# Patient Record
Sex: Male | Born: 1960 | Race: White | Hispanic: No | Marital: Single | Smoking: Former smoker
Health system: Southern US, Community
[De-identification: ages and names within clinical notes are randomized; demographics above are authoritative.]

## PROBLEM LIST (undated history)

## (undated) DIAGNOSIS — Z87442 Personal history of urinary calculi: Secondary | ICD-10-CM

## (undated) DIAGNOSIS — Z86010 Personal history of colonic polyps: Secondary | ICD-10-CM

## (undated) DIAGNOSIS — S93409A Sprain of unspecified ligament of unspecified ankle, initial encounter: Secondary | ICD-10-CM

## (undated) DIAGNOSIS — I10 Essential (primary) hypertension: Secondary | ICD-10-CM

## (undated) DIAGNOSIS — T7840XA Allergy, unspecified, initial encounter: Secondary | ICD-10-CM

## (undated) DIAGNOSIS — Z9889 Other specified postprocedural states: Secondary | ICD-10-CM

## (undated) DIAGNOSIS — K5792 Diverticulitis of intestine, part unspecified, without perforation or abscess without bleeding: Secondary | ICD-10-CM

## (undated) DIAGNOSIS — R112 Nausea with vomiting, unspecified: Secondary | ICD-10-CM

## (undated) HISTORY — DX: Diverticulitis of intestine, part unspecified, without perforation or abscess without bleeding: K57.92

## (undated) HISTORY — PX: COLONOSCOPY: SHX174

## (undated) HISTORY — DX: Essential (primary) hypertension: I10

## (undated) HISTORY — DX: Sprain of unspecified ligament of unspecified ankle, initial encounter: S93.409A

## (undated) HISTORY — DX: Personal history of urinary calculi: Z87.442

## (undated) HISTORY — DX: Allergy, unspecified, initial encounter: T78.40XA

## (undated) HISTORY — DX: Personal history of colonic polyps: Z86.010

---

## 2004-09-13 ENCOUNTER — Ambulatory Visit: Payer: Self-pay | Admitting: Internal Medicine

## 2005-11-11 ENCOUNTER — Ambulatory Visit: Payer: Self-pay | Admitting: Internal Medicine

## 2005-11-18 ENCOUNTER — Ambulatory Visit: Payer: Self-pay | Admitting: Internal Medicine

## 2006-02-11 ENCOUNTER — Ambulatory Visit: Payer: Self-pay | Admitting: Internal Medicine

## 2006-03-04 ENCOUNTER — Ambulatory Visit: Payer: Self-pay | Admitting: Internal Medicine

## 2007-01-05 DIAGNOSIS — S93409A Sprain of unspecified ligament of unspecified ankle, initial encounter: Secondary | ICD-10-CM | POA: Insufficient documentation

## 2007-01-05 HISTORY — DX: Sprain of unspecified ligament of unspecified ankle, initial encounter: S93.409A

## 2007-01-19 ENCOUNTER — Ambulatory Visit: Payer: Self-pay | Admitting: Internal Medicine

## 2007-03-09 DIAGNOSIS — Z8719 Personal history of other diseases of the digestive system: Secondary | ICD-10-CM

## 2007-03-09 DIAGNOSIS — Z87442 Personal history of urinary calculi: Secondary | ICD-10-CM | POA: Insufficient documentation

## 2007-03-09 HISTORY — DX: Personal history of urinary calculi: Z87.442

## 2007-11-10 ENCOUNTER — Ambulatory Visit: Payer: Self-pay | Admitting: Internal Medicine

## 2007-11-10 DIAGNOSIS — J019 Acute sinusitis, unspecified: Secondary | ICD-10-CM

## 2008-02-16 ENCOUNTER — Ambulatory Visit: Payer: Self-pay | Admitting: Internal Medicine

## 2008-06-07 ENCOUNTER — Ambulatory Visit: Payer: Self-pay | Admitting: Internal Medicine

## 2008-06-07 DIAGNOSIS — K5732 Diverticulitis of large intestine without perforation or abscess without bleeding: Secondary | ICD-10-CM

## 2008-06-07 DIAGNOSIS — R1032 Left lower quadrant pain: Secondary | ICD-10-CM | POA: Insufficient documentation

## 2008-06-07 LAB — CONVERTED CEMR LAB
Basophils Absolute: 0.1 10*3/uL (ref 0.0–0.1)
Bilirubin, Direct: 0.1 mg/dL (ref 0.0–0.3)
CO2: 26 meq/L (ref 19–32)
Calcium: 8.7 mg/dL (ref 8.4–10.5)
Creatinine, Ser: 0.8 mg/dL (ref 0.4–1.5)
Eosinophils Absolute: 0.1 10*3/uL (ref 0.0–0.7)
GFR calc Af Amer: 132 mL/min
GFR calc non Af Amer: 109 mL/min
Hemoglobin: 13.7 g/dL (ref 13.0–17.0)
Lymphocytes Relative: 12.9 % (ref 12.0–46.0)
MCHC: 34.7 g/dL (ref 30.0–36.0)
MCV: 93.8 fL (ref 78.0–100.0)
Monocytes Absolute: 1.3 10*3/uL — ABNORMAL HIGH (ref 0.1–1.0)
Neutro Abs: 9.5 10*3/uL — ABNORMAL HIGH (ref 1.4–7.7)
RDW: 10.9 % — ABNORMAL LOW (ref 11.5–14.6)
Sodium: 136 meq/L (ref 135–145)
Total Bilirubin: 1 mg/dL (ref 0.3–1.2)

## 2008-06-08 ENCOUNTER — Ambulatory Visit: Payer: Self-pay | Admitting: Cardiology

## 2008-06-08 ENCOUNTER — Inpatient Hospital Stay (HOSPITAL_COMMUNITY): Admission: EM | Admit: 2008-06-08 | Discharge: 2008-06-13 | Payer: Self-pay | Admitting: General Surgery

## 2008-08-02 ENCOUNTER — Encounter: Payer: Self-pay | Admitting: Internal Medicine

## 2008-08-03 ENCOUNTER — Ambulatory Visit: Payer: Self-pay | Admitting: Internal Medicine

## 2008-09-10 HISTORY — PX: COLECTOMY: SHX59

## 2008-09-11 ENCOUNTER — Encounter: Payer: Self-pay | Admitting: Internal Medicine

## 2008-09-14 ENCOUNTER — Inpatient Hospital Stay (HOSPITAL_COMMUNITY): Admission: RE | Admit: 2008-09-14 | Discharge: 2008-09-19 | Payer: Self-pay | Admitting: General Surgery

## 2008-09-14 ENCOUNTER — Encounter (INDEPENDENT_AMBULATORY_CARE_PROVIDER_SITE_OTHER): Payer: Self-pay | Admitting: General Surgery

## 2008-10-06 ENCOUNTER — Encounter: Payer: Self-pay | Admitting: Internal Medicine

## 2009-01-13 ENCOUNTER — Encounter: Payer: Self-pay | Admitting: Internal Medicine

## 2009-07-20 ENCOUNTER — Telehealth: Payer: Self-pay | Admitting: Internal Medicine

## 2009-08-31 ENCOUNTER — Ambulatory Visit: Payer: Self-pay | Admitting: Internal Medicine

## 2009-09-06 ENCOUNTER — Ambulatory Visit: Payer: Self-pay | Admitting: Internal Medicine

## 2009-10-24 ENCOUNTER — Encounter: Payer: Self-pay | Admitting: Internal Medicine

## 2010-06-13 NOTE — Assessment & Plan Note (Signed)
Summary: abd problems/bmw   Vital Signs:  Patient profile:   50 year old male Temp:     98.6 degrees F oral Pulse rate:   80 / minute Resp:     14 per minute BP sitting:   140 / 90  (left arm)  Vitals Entered By: Willy Eddy, LPN (September 06, 2009 9:41 AM) CC: c/o  possible hernia   Primary Care Provider:  Stacie Glaze MD  CC:  c/o  possible hernia.  History of Present Illness: then pt has a small umbilical herni that he would like reviewed he has a hx of prior labarscopic assisted colectomy for diverticulosis  Preventive Screening-Counseling & Management  Alcohol-Tobacco     Smoking Status: never  Problems Prior to Update: 1)  Abdominal Pain, Left Lower Quadrant  (ICD-789.04) 2)  Diverticulitis of Colon  (ICD-562.11) 3)  Acute Sinusitis, Unspecified  (ICD-461.9) 4)  Nephrolithiasis, Hx of  (ICD-V13.01) 5)  Diverticulitis, Hx of  (ICD-V12.79) 6)  Sprain/strain, Ankle Nos  (ICD-845.00)  Current Problems (verified): 1)  Abdominal Pain, Left Lower Quadrant  (ICD-789.04) 2)  Diverticulitis of Colon  (ICD-562.11) 3)  Acute Sinusitis, Unspecified  (ICD-461.9) 4)  Nephrolithiasis, Hx of  (ICD-V13.01) 5)  Diverticulitis, Hx of  (ICD-V12.79) 6)  Sprain/strain, Ankle Nos  (ICD-845.00)  Medications Prior to Update: 1)  Avelox 400 Mg Tabs (Moxifloxacin Hcl) .... One By Mouth Daily For 7 Days 2)  Brompheniramine-Pseudoeph 9-90 Mg Xr12h-Cap (Brompheniramine-Pseudoeph) .... One By Mouth Two Times A Day For 10 Days  Current Medications (verified): 1)  Avelox 400 Mg Tabs (Moxifloxacin Hcl) .... One By Mouth Daily For 7 Days 2)  Brompheniramine-Pseudoeph 9-90 Mg Xr12h-Cap (Brompheniramine-Pseudoeph) .... One By Mouth Two Times A Day For 10 Days  Allergies (verified): No Known Drug Allergies  Past History:  Past Medical History: Last updated: 03/09/2007 Diverticulitis, hx of Nephrolithiasis, hx of Anal Fistula  Past Surgical History: Last updated:  03/09/2007 Colonoscopy  Family History: Last updated: 03/09/2007 Family History of Cardiovascular disorder  Social History: Last updated: 03/09/2007 Occupation: Single Never Smoked Alcohol use-yes Drug use-no  Risk Factors: Smoking Status: never (09/06/2009)  Family History: Reviewed history from 03/09/2007 and no changes required. Family History of Cardiovascular disorder  Social History: Reviewed history from 03/09/2007 and no changes required. Occupation: Single Never Smoked Alcohol use-yes Drug use-no  Review of Systems  The patient denies anorexia, fever, weight loss, weight gain, vision loss, decreased hearing, hoarseness, chest pain, syncope, dyspnea on exertion, peripheral edema, prolonged cough, headaches, hemoptysis, abdominal pain, melena, hematochezia, severe indigestion/heartburn, hematuria, incontinence, genital sores, muscle weakness, suspicious skin lesions, transient blindness, difficulty walking, depression, unusual weight change, abnormal bleeding, enlarged lymph nodes, angioedema, and breast masses.    Physical Exam  General:  alert and overweight-appearing.   Head:  normocephalic and atraumatic.   Eyes:  pupils equal and pupils round.   Ears:  R ear normal and no external deformities.   Nose:  no external deformity and no nasal discharge.   Mouth:  posterior lymphoid hypertrophy and postnasal drip.   Neck:  No deformities, masses, or tenderness noted. Lungs:  normal respiratory effort and no wheezes.   Abdomen:  soft and non-tender.   Extremities:  No clubbing, cyanosis, edema, or deformity noted with normal full range of motion of all joints.   Neurologic:  No cranial nerve deficits noted. Station and gait are normal. Plantar reflexes are down-going bilaterally. DTRs are symmetrical throughout. Sensory, motor and coordinative functions appear intact. Skin:  Intact without suspicious lesions or rashes Psych:  Oriented X3 and memory intact for  recent and remote.     Impression & Recommendations:  Problem # 1:  HERNIA, UMBILICAL (ICD-553.1) 2cm periumbilical hernia observatons and guidence about any worisom progression  Complete Medication List: 1)  Avelox 400 Mg Tabs (Moxifloxacin hcl) .... One by mouth daily for 7 days 2)  Brompheniramine-pseudoeph 9-90 Mg Xr12h-cap (Brompheniramine-pseudoeph) .... One by mouth two times a day for 10 days  Patient Instructions: 1)  You need to lose weight. Consider a lower calorie diet and regular exercise.

## 2010-06-13 NOTE — Progress Notes (Signed)
  Phone Note Call from Patient   Summary of Call: pt called c/o sinusitis like signs with cough for 1 week-requesting medication Initial call taken by: Willy Eddy, LPN,  July 20, 2009 1:08 PM    New/Updated Medications: ATUSS DS 30-4-30 MG/5ML SUSP (PSEUDOEPHED HCL-CPM-DM HBR TAN) 2 tsp every 12 hours Prescriptions: ATUSS DS 30-4-30 MG/5ML SUSP (PSEUDOEPHED HCL-CPM-DM HBR TAN) 2 tsp every 12 hours  #8oz x 1   Entered by:   Willy Eddy, LPN   Authorized by:   Stacie Glaze MD   Signed by:   Willy Eddy, LPN on 16/02/9603   Method used:   Electronically to        [facility]. [phone]* (retail)       [address]       [place], [place]  [address]       Ph: [phone]       Fax: [phone]   RxID:   [id number] ZITHROMAX Z-PAK 250 MG TABS (AZITHROMYCIN) take as directed  #1 x 0   Entered by:   Willy Eddy, LPN   Authorized by:   Stacie Glaze MD   Signed by:   Willy Eddy, LPN on 53/66/4403   Method used:   Electronically to        [facility]. [phone]* (retail)       [address]       [place], [place]  [address]       Ph: [phone]       Fax: [phone]   RxID:   [id number]   Appended Document:  per dr Lovell Sheehan may have zpack and and atuss     8 oz 2 tspo every 12 hours

## 2010-06-13 NOTE — Assessment & Plan Note (Signed)
Summary: roa/bmw   Vital Signs:  Patient profile:   50 year old male Temp:     98.8 degrees F oral Pulse rate:   76 / minute Pulse rhythm:   regular Resp:     14 per minute BP sitting:   140 / 90  (left arm)  Vitals Entered By: Willy Eddy, LPN (August 31, 2009 12:11 PM) CC: c/o sinusitis like sx, URI symptoms   Primary Care Provider:  Stacie Glaze MD  CC:  c/o sinusitis like sx and URI symptoms.  History of Present Illness:  URI Symptoms      This is a 50 year old man who presents with URI symptoms.  The patient reports nasal congestion, purulent nasal discharge, productive cough, and earache, but denies clear nasal discharge, sore throat, dry cough, and sick contacts.  Associated symptoms include low-grade fever (<100.5 degrees).  The patient denies fever, fever of 100.5-103 degrees, fever of 103.1-104 degrees, fever to >104 degrees, stiff neck, dyspnea, wheezing, rash, vomiting, diarrhea, use of an antipyretic, and response to antipyretic.  The patient also reports headache and muscle aches.  Risk factors for Strep sinusitis include unilateral facial pain.    Preventive Screening-Counseling & Management  Alcohol-Tobacco     Smoking Status: never  Problems Prior to Update: 1)  Abdominal Pain, Left Lower Quadrant  (ICD-789.04) 2)  Diverticulitis of Colon  (ICD-562.11) 3)  Acute Sinusitis, Unspecified  (ICD-461.9) 4)  Nephrolithiasis, Hx of  (ICD-V13.01) 5)  Diverticulitis, Hx of  (ICD-V12.79) 6)  Sprain/strain, Ankle Nos  (ICD-845.00)  Current Problems (verified): 1)  Abdominal Pain, Left Lower Quadrant  (ICD-789.04) 2)  Diverticulitis of Colon  (ICD-562.11) 3)  Acute Sinusitis, Unspecified  (ICD-461.9) 4)  Nephrolithiasis, Hx of  (ICD-V13.01) 5)  Diverticulitis, Hx of  (ICD-V12.79) 6)  Sprain/strain, Ankle Nos  (ICD-845.00)  Medications Prior to Update: 1)  Metronidazole 250 Mg Tabs (Metronidazole) .Marland Kitchen.. 1 Four Times A Day 2)  Levaquin 500 Mg Tabs  (Levofloxacin) .... One By Mouth Daily 3)  Zithromax Z-Pak 250 Mg Tabs (Azithromycin) .... Take As Directed 4)  Atuss Ds 30-4-30 Mg/40ml Susp (Pseudoephed Hcl-Cpm-Dm Hbr Tan) .... 2 Tsp Every 12 Hours  Current Medications (verified): 1)  Avelox 400 Mg Tabs (Moxifloxacin Hcl) .... One By Mouth Daily For 7 Days 2)  Brompheniramine-Pseudoeph 9-90 Mg Xr12h-Cap (Brompheniramine-Pseudoeph) .... One By Mouth Two Times A Day For 10 Days  Allergies (verified): No Known Drug Allergies  Past History:  Family History: Last updated: 03/09/2007 Family History of Cardiovascular disorder  Social History: Last updated: 03/09/2007 Occupation: Single Never Smoked Alcohol use-yes Drug use-no  Risk Factors: Smoking Status: never (08/31/2009)  Past medical, surgical, family and social histories (including risk factors) reviewed, and no changes noted (except as noted below).  Past Medical History: Reviewed history from 03/09/2007 and no changes required. Diverticulitis, hx of Nephrolithiasis, hx of Anal Fistula  Past Surgical History: Reviewed history from 03/09/2007 and no changes required. Colonoscopy  Family History: Reviewed history from 03/09/2007 and no changes required. Family History of Cardiovascular disorder  Social History: Reviewed history from 03/09/2007 and no changes required. Occupation: Single Never Smoked Alcohol use-yes Drug use-no  Review of Systems  The patient denies anorexia, fever, weight loss, weight gain, vision loss, decreased hearing, hoarseness, chest pain, syncope, dyspnea on exertion, peripheral edema, prolonged cough, headaches, hemoptysis, abdominal pain, melena, hematochezia, severe indigestion/heartburn, hematuria, incontinence, genital sores, muscle weakness, suspicious skin lesions, transient blindness, difficulty walking, depression, unusual weight change, abnormal bleeding,  enlarged lymph nodes, angioedema, breast masses, and testicular masses.     Physical Exam  General:  alert and overweight-appearing.   Head:  normocephalic and atraumatic.   Eyes:  pupils equal and pupils round.   Ears:  R ear normal and no external deformities.   Nose:  no external deformity and no nasal discharge.   Mouth:  posterior lymphoid hypertrophy and postnasal drip.   Neck:  No deformities, masses, or tenderness noted. Lungs:  normal respiratory effort and no wheezes.   Heart:  normal rate and regular rhythm.   Abdomen:  soft, non-tender, and normal bowel sounds.     Impression & Recommendations:  Problem # 1:  ACUTE SINUSITIS, UNSPECIFIED (ICD-461.9) Assessment Deteriorated acute muscopurulent sinusitis The following medications were removed from the medication list:    Metronidazole 250 Mg Tabs (Metronidazole) .Marland Kitchen... 1 four times a day    Levaquin 500 Mg Tabs (Levofloxacin) ..... One by mouth daily    Zithromax Z-pak 250 Mg Tabs (Azithromycin) .Marland Kitchen... Take as directed    Atuss Ds 30-4-30 Mg/54ml Susp (Pseudoephed hcl-cpm-dm hbr tan) .Marland Kitchen... 2 tsp every 12 hours His updated medication list for this problem includes:    Avelox 400 Mg Tabs (Moxifloxacin hcl) ..... One by mouth daily for 7 days    Brompheniramine-pseudoeph 9-90 Mg Xr12h-cap (Brompheniramine-pseudoeph) ..... One by mouth two times a day for 10 days  Instructed on treatment. Call if symptoms persist or worsen.   Complete Medication List: 1)  Avelox 400 Mg Tabs (Moxifloxacin hcl) .... One by mouth daily for 7 days 2)  Brompheniramine-pseudoeph 9-90 Mg Xr12h-cap (Brompheniramine-pseudoeph) .... One by mouth two times a day for 10 days  Patient Instructions: 1)  Take your antibiotic as prescribed until ALL of it is gone, but stop if you develop a rash or swelling and contact our office as soon as possible. Prescriptions: BROMPHENIRAMINE-PSEUDOEPH 9-90 MG XR12H-CAP (BROMPHENIRAMINE-PSEUDOEPH) one by mouth two times a day for 10 days  #20 x 1   Entered and Authorized by:   Stacie Glaze  MD   Signed by:   Stacie Glaze MD on 08/31/2009   Method used:   Electronically to        [facility]. [phone]* (retail)       [address]       [place], [place]  [address]       Ph: [phone]       Fax: [phone]   RxID:   [id number]

## 2010-06-13 NOTE — Medication Information (Signed)
Summary: Order for Medical Compression Garments  Order for Medical Compression Garments   Imported By: Maryln Gottron 10/26/2009 11:31:34  _____________________________________________________________________  External Attachment:    Type:   Image     Comment:   External Document

## 2010-08-21 LAB — CBC
HCT: 38.5 % — ABNORMAL LOW (ref 39.0–52.0)
HCT: 38.6 % — ABNORMAL LOW (ref 39.0–52.0)
Hemoglobin: 13.7 g/dL (ref 13.0–17.0)
Hemoglobin: 13.8 g/dL (ref 13.0–17.0)
MCHC: 35.5 g/dL (ref 30.0–36.0)
MCHC: 35.8 g/dL (ref 30.0–36.0)
Platelets: 329 10*3/uL (ref 150–400)
RBC: 4.1 MIL/uL — ABNORMAL LOW (ref 4.22–5.81)
RDW: 13.1 % (ref 11.5–15.5)
RDW: 13.4 % (ref 11.5–15.5)

## 2010-08-21 LAB — BASIC METABOLIC PANEL
BUN: 6 mg/dL (ref 6–23)
CO2: 26 mEq/L (ref 19–32)
CO2: 29 mEq/L (ref 19–32)
Calcium: 8.4 mg/dL (ref 8.4–10.5)
GFR calc Af Amer: >60 mL/min (ref >60)
GFR calc non Af Amer: >60 mL/min (ref >60)
Glucose, Bld: 107 mg/dL — ABNORMAL HIGH (ref 70–99)
Glucose, Bld: 128 mg/dL — ABNORMAL HIGH (ref 70–99)
Potassium: 3.7 mEq/L (ref 3.5–5.1)
Potassium: 4.7 mEq/L (ref 3.5–5.1)
Sodium: 134 mEq/L — ABNORMAL LOW (ref 135–145)
Sodium: 136 mEq/L (ref 135–145)

## 2010-08-22 LAB — COMPREHENSIVE METABOLIC PANEL
ALT: 29 U/L (ref 0–53)
AST: 25 U/L (ref 0–37)
CO2: 26 mEq/L (ref 19–32)
Chloride: 107 mEq/L (ref 96–112)
GFR calc Af Amer: >60 mL/min (ref >60)
GFR calc non Af Amer: >60 mL/min (ref >60)
Sodium: 139 mEq/L (ref 135–145)
Total Bilirubin: 0.6 mg/dL (ref 0.3–1.2)

## 2010-08-22 LAB — URINALYSIS, ROUTINE W REFLEX MICROSCOPIC
Bilirubin Urine: NEGATIVE
Glucose, UA: NEGATIVE mg/dL
Hgb urine dipstick: NEGATIVE
Ketones, ur: NEGATIVE mg/dL
Nitrite: NEGATIVE
Protein, ur: NEGATIVE mg/dL
Specific Gravity, Urine: 1.029 (ref 1.005–1.030)
Urobilinogen, UA: 0.2 mg/dL (ref 0.0–1.0)
pH: 6 (ref 5.0–8.0)

## 2010-08-22 LAB — CBC
HCT: 43.6 % (ref 39.0–52.0)
Hemoglobin: 15.2 g/dL (ref 13.0–17.0)
MCHC: 34.9 g/dL (ref 30.0–36.0)
MCV: 93.6 fL (ref 78.0–100.0)
Platelets: 224 10*3/uL (ref 150–400)
RBC: 4.66 MIL/uL (ref 4.22–5.81)
RDW: 14.1 % (ref 11.5–15.5)
WBC: 8 10*3/uL (ref 4.0–10.5)

## 2010-08-22 LAB — DIFFERENTIAL
Basophils Absolute: 0 10*3/uL (ref 0.0–0.1)
Basophils Relative: 0 % (ref 0–1)
Eosinophils Absolute: 0.2 10*3/uL (ref 0.0–0.7)
Eosinophils Relative: 3 % (ref 0–5)

## 2010-08-27 LAB — CBC
HCT: 36 % — ABNORMAL LOW (ref 39.0–52.0)
HCT: 38.3 % — ABNORMAL LOW (ref 39.0–52.0)
Hemoglobin: 12.3 g/dL — ABNORMAL LOW (ref 13.0–17.0)
Hemoglobin: 13.2 g/dL (ref 13.0–17.0)
MCHC: 34.1 g/dL (ref 30.0–36.0)
MCHC: 34.3 g/dL (ref 30.0–36.0)
MCV: 93.5 fL (ref 78.0–100.0)
MCV: 94.1 fL (ref 78.0–100.0)
Platelets: 427 10*3/uL — ABNORMAL HIGH (ref 150–400)
Platelets: 431 10*3/uL — ABNORMAL HIGH (ref 150–400)
RBC: 3.85 MIL/uL — ABNORMAL LOW (ref 4.22–5.81)
RBC: 4.07 MIL/uL — ABNORMAL LOW (ref 4.22–5.81)
RDW: 11.7 % (ref 11.5–15.5)
RDW: 12 % (ref 11.5–15.5)
WBC: 6.5 10*3/uL (ref 4.0–10.5)
WBC: 9.4 10*3/uL (ref 4.0–10.5)

## 2010-08-27 LAB — URINALYSIS, ROUTINE W REFLEX MICROSCOPIC
Bilirubin Urine: NEGATIVE
Glucose, UA: NEGATIVE mg/dL
Hgb urine dipstick: NEGATIVE
Ketones, ur: 40 mg/dL — AB
Nitrite: NEGATIVE
Protein, ur: NEGATIVE mg/dL
Specific Gravity, Urine: 1.046 — ABNORMAL HIGH (ref 1.005–1.030)
Urobilinogen, UA: 1 mg/dL (ref 0.0–1.0)
pH: 6 (ref 5.0–8.0)

## 2010-08-27 LAB — COMPREHENSIVE METABOLIC PANEL
ALT: 21 U/L (ref 0–53)
AST: 18 U/L (ref 0–37)
Albumin: 3.1 g/dL — ABNORMAL LOW (ref 3.5–5.2)
Alkaline Phosphatase: 64 U/L (ref 39–117)
BUN: 11 mg/dL (ref 6–23)
CO2: 24 mEq/L (ref 19–32)
Calcium: 8.6 mg/dL (ref 8.4–10.5)
Chloride: 99 mEq/L (ref 96–112)
Creatinine, Ser: 0.89 mg/dL (ref 0.4–1.5)
GFR calc Af Amer: >60 mL/min (ref >60)
GFR calc non Af Amer: >60 mL/min (ref >60)
Glucose, Bld: 105 mg/dL — ABNORMAL HIGH (ref 70–99)
Potassium: 3.7 mEq/L (ref 3.5–5.1)
Sodium: 132 mEq/L — ABNORMAL LOW (ref 135–145)
Total Bilirubin: 0.8 mg/dL (ref 0.3–1.2)
Total Protein: 6.9 g/dL (ref 6.0–8.3)

## 2010-08-27 LAB — APTT: aPTT: 41 seconds — ABNORMAL HIGH (ref 24–37)

## 2010-08-27 LAB — PROTIME-INR
INR: 1.2 (ref 0.00–1.49)
Prothrombin Time: 15.6 seconds — ABNORMAL HIGH (ref 11.6–15.2)

## 2010-09-25 NOTE — Op Note (Signed)
[name], [name]                ACCOUNT NO.:  [id number]   MEDICAL RECORD NO.:  [id number]          PATIENT TYPE:  INP   LOCATION:  1532                         FACILITY:  [facility]   PHYSICIAN:  Sharlet Salina T. Hoxworth, M.D.DATE OF BIRTH:  March 13, 1961   DATE OF PROCEDURE:  09/14/2008  DATE OF DISCHARGE:                               OPERATIVE REPORT   PREOPERATIVE DIAGNOSIS:  Diverticulitis, sigmoid colon.   POSTOPERATIVE DIAGNOSIS:  Diverticulitis, sigmoid colon.   SURGICAL PROCEDURE:  Laparoscopic-assisted sigmoid colectomy with  takedown of splenic flexure.   SURGEON:  Lorne Skeens. Hoxworth, M.D.   ASSISTANT:  Anselm Pancoast. Zachery Dakins, M.D.   ANESTHESIA:  General.   BRIEF HISTORY:  [name] [name] is a 50 year old male who was hospitalized in  late January and early February for acute diverticulitis of the sigmoid  colon with a pericolonic abscess.  The abscess resolved with  antibiotics, and he improved and was discharged home but has had  continued pain and on follow-up in the office has developed a recurrent  palpable mass in the left lower quadrant.  He has required ongoing  treatment with antibiotics, and we have elected to proceed with sigmoid  colectomy.  We plan to do this laparoscopic assisted, if possible.  The  nature procedure, its indications, risks of anesthesia complications,  bleeding, infection, visceral injury, temporary colostomy were discussed  and understood.  Following mechanical antibiotic bowel prep at home, he  is now admitted for this procedure.   DESCRIPTION OF OPERATION:  The patient was brought to the operating  room, placed in supine position on the operating table, and general  orotracheal anesthesia was induced.  He was then carefully secured to  the bed and positioned in semilithotomy position with arms tucked and  carefully padded.  He received preoperative broad-spectrum IV  antibiotics.  PAS were in place.  Correct patient and procedure were  verified, after abdomen and perineum widely sterilely prepped and  draped.  Access was obtained with an open Hassan technique with a 1.5-cm  incision above the umbilicus and pneumoperitoneum established.  There  was a large inflammatory mass involving the sigmoid colon obviously  apparent.  Under direct vision, two 5-mm trocars were placed in the  right and left lower quadrant.  I then through a 7-cm low midline  incision placed a wound protector and GelPort for hand assist.  The mass  in the sigmoid which appeared to be inflammatory was quite large.  There  was some fixation and induration.  I did mobilize this somewhat off the  lateral peritoneal wall with careful blunt dissection through some  chronic dense inflammatory adhesions.  There was a good amount of normal  soft distal rectosigmoid to well up above the sacral promontory.  The  colon proximal to the sigmoid in the distal left colon was normal  without diverticular thickening or muscle hypertrophy.  Unfortunately,  the mass was so large and was so fixed I really could not elevate this  to approach the mesentery medially as there was just no room to elevate  the colon and expose the mesentery,  and laterally, the adhesions were so  dense inflammatory that I did not feel I could safely do this  laparoscopically.  I therefore decided to proceed with mobilization of  the left colon and splenic flexure laparoscopically to avoid the  incision above the umbilicus.  The peritoneal reflection along the  distal left colon and then working up toward the sigmoid was divided  with the cautery and the left colon carefully bluntly mobilized out of  the retroperitoneum.  The omentum was then elevated and was carefully  dissected up off of the distal transverse colon with the LigaSure  carefully protecting the colon.  As this dissection proceeded more  proximally, the lesser sac was entered, and then the lesser sac was  widely opened from the mid  transverse colon distally toward the splenic  flexure.  Final attachments of the omentum off the splenic flexure were  mobilized, and then the splenocolic ligament was exposed and  sequentially divided with the LigaSure device, and then the splenic  flexure further bluntly mobilized medially and inferiorly until the  transverse colon, splenic flexure and left colon were all completely and  well mobilized.  Following this, CO2 evacuated, and I removed the  GelPort.  Initially, the sigmoid colon was carefully mobilized laterally  under direct vision through the 7-cm incision; however, to provide  better exposure to the bulkiness of the inflammatory mass with the wound  protector remaining in place, I extended the skin and fascial incision  up another several centimeters well below the umbilicus.  This allowed  better mobilization and visualization, and using careful dissection and  staying well up toward the bowel, the mesentery was mobilized lateral to  medial.  There was so much inflammatory change down into the mesentery  and the retroperitoneum that I did not feel I was safely able to  directly visualize the ureter.  Instead, we kept the dissection well up  on the mesentery toward the bowel.  Eventually, we were able to get the  mesentery completely mobilized and bring up the distal normal bowel into  the incision.  A place for division was chosen, and it was cleaned of  mesenteric and pericolic fat distally and divided with GIA stapler.  This allowed elevation of the distal specimen, and then we were able to  come down along the mesentery with the LigaSure, working distal to  proximal, again staying close to the colon.  Continued proximally up to  normal mesentery and distal left colon, and there was plenty of mobility  to bring this down for an end-to-end anastomosis.  A point of proximal  division was chosen of normal bowel, and this was divided between [employer] clamps, and  the final bit of mesentery was divided with the  LigaSure and the specimen removed.  Following this, the pelvis was  thoroughly irrigated and hemostasis obtained.  An end-to-end anastomosis  was then created.  The staple line was excised from the distal bowel,  and then the anastomosis was created with full-thickness inverting  interrupted 2-0 silk sutures circumferentially.  There was no tension,  good blood supply, and the anastomosis was widely patent.  The pelvis  was then again irrigated.  Hemostasis assured.  Following this, I put  the GelPort back on, and the patient was reinsufflated, and under direct  laparoscopic vision, the upper abdomen and left upper quadrant  retroperitoneum were inspected for hemostasis which appeared complete.  Following this, trocars were removed, CO2 evacuated and  the GelPort and  wound protector removed.  Gloves and instruments were changed.  The  midline wound was then closed with running #1 PDS begun at either end  and tied centrally.  The subcutaneous tissue was irrigated, and skin  closed with staples.  Sponge, needle and instrument counts correct.  The  patient taken to recovery in good condition.      Lorne Skeens. Hoxworth, M.D.  Electronically Signed     BTH/MEDQ  D:  09/14/2008  T:  09/14/2008  Job:  [id number]

## 2010-09-25 NOTE — H&P (Signed)
NAMELADARRION, [name]                ACCOUNT NO.:  [id number]   MEDICAL RECORD NO.:  [id number]          PATIENT TYPE:  INP   LOCATION:  1529                         FACILITY:  [facility]   PHYSICIAN:  Sharlet Salina T. Hoxworth, M.D.DATE OF BIRTH:  [birth year]   DATE OF ADMISSION:  06/08/2008  DATE OF DISCHARGE:                              HISTORY & PHYSICAL   CHIEF COMPLAINT:  Left lower quadrant abdominal pain.   HISTORY OF PRESENT ILLNESS:  [name] [name] is a 50 year old male with a  previous significant history of an episode of acute diverticulitis 2  years ago which was diagnosed by CT scan and treated with outpatient  antibiotics.  He states he is not really had any ongoing problems since  that time.  He now, however, presents with about 2 weeks of increasing  left lower quadrant abdominal pain.  This was gradual in onset.  He has  had some low-grade fever.  He feels some constipation and generalized  abdominal pressure which is relieved somewhat with a bowel movement.  He  called Dr. Lovell Sheehan 3 days ago and was started empirically on Cipro and  Flagyl, and then a CT scan was ordered after the patient was seen in  office.  This has returned showing significant diverticulitis with  abscess as described below.  His pain is not severe but is ongoing and  significant.  He has had some low-grade fever of about 100-100.5.  No  nausea or vomiting.  No urinary symptoms.   PAST MEDICAL HISTORY:  1. He has had an anal fistulotomy a number of years ago.  2. He had a colonoscopy 2 years ago.  He is not followed for any      significant medical conditions.   MEDICATIONS:  None.   ALLERGIES:  NONE.   SOCIAL HISTORY:  Single, works as an Pensions consultant.  Does not smoke  cigarettes, drinks occasional alcohol.   FAMILY HISTORY:  Noncontributory.   REVIEW OF SYSTEMS:  GENERAL:  Positive for fever.  No malaise or  weakness.  HEENT:  No vision, hearing or swallowing problems.  RESPIRATORY:  No  shortness of breath, cough or wheezing.  CARDIAC:  No chest pain, palpitation, swelling or history of heart  disease.  ABDOMEN/GI:  As above.  GU:  No urinary burning, frequency or pneumaturia.  MUSCULOSKELETAL:  No joint pain, swelling.  NEUROLOGIC:  No numbness, weakness or syncope.   PHYSICAL EXAMINATION:  VITAL SIGNS:  Temperature is 98.6, pulse 97,  respirations 18, blood pressure 154/99.  GENERAL:  He is a mildly overweight white male who does not appear  acutely ill.  SKIN:  Warm and dry.  No rash or infection.  HEENT:  No palpable mass or thyromegaly.  Sclerae are nonicteric.  Oropharynx clear.  Pupils equal and reactive to light.  EOMs intact.  LUNGS:  Clear without wheezing or increased work of breathing.  CARDIAC:  Regular rate and rhythm.  No murmurs.  No edema.  No JVD.  LYMPH NODES:  No cervical, subclavicular, axillary or inguinal nodes  palpable.  ABDOMEN:  Mild to moderate obesity.  Positive bowel sounds.  There is  tenderness in the left lower quadrant and a definite palpable mass.  No  peritoneal signs and minimal guarding.  Remainder of abdomen soft and  nontender.  No organomegaly.  No hernias.  GU:  Normal male.  EXTREMITIES:  No joint swelling or deformity.  NEUROLOGIC:  Alert and oriented.  Motor and sensory exam is grossly  normal.   LABORATORY:  White count is 9.4, hemoglobin 13.2.  Remainder of lab  pending.  CT of the abdomen and pelvis obtained today shows minimal  atelectasis.  No acute abnormality of the abdomen.  Pelvis shows  extensive inflammatory change at the junction of the descending and  sigmoid colon with wall thickening and an extracolonic fluid collection  measuring 4.2 cm in maximum diameter, as well as an immediately adjacent  second collection measuring 1.9 cm.  Appendix is normal.   ASSESSMENT/PLAN:  Acute sigmoid diverticulitis with pericolonic abscess.  The patient does not appear severely ill.  He is to be admitted and  placed on  IV antibiotics.  Will ask interventional radiology to see in  regards to possible percutaneous drainage of his colonic abscess.  Ideally, this episode could be managed nonoperatively and then will need  to consider elective resection due to recurrence with complication of  pericolonic abscess.      Lorne Skeens. Hoxworth, M.D.  Electronically Signed     BTH/MEDQ  D:  06/08/2008  T:  06/08/2008  Job:  [id number]   cc:   Stacie Glaze, MD  [address] [place]  [place] [address]

## 2010-09-25 NOTE — Discharge Summary (Signed)
[name] [name], [name] [name]                ACCOUNT NO.:  [id number]   MEDICAL RECORD NO.:  [id number]          PATIENT TYPE:  INP   LOCATION:  1529                         FACILITY:  [facility]   PHYSICIAN:  Sharlet Salina T. Hoxworth, M.D.DATE OF BIRTH:  [birth year]   DATE OF ADMISSION:  06/08/2008  DATE OF DISCHARGE:  06/13/2008                               DISCHARGE SUMMARY   DISCHARGE DIAGNOSIS:  Diverticulitis sigmoid colon with pericolonic  abscess.   OPERATIONS AND PROCEDURES:  None.   HISTORY OF PRESENT ILLNESS:  [name] [name] is a 50 year old male with a  previous history of acute diverticulitis about 2 years ago diagnosed  with CT scan and treated with outpatient antibiotics.  He has not had  any ongoing symptoms but now presents to his primary physician with 2  weeks of increasing left lower quadrant abdominal pain, gradual in  onset.  This was associated with low-grade fever, constipation and  generalized abdominal pressure.  He was started on Cipro and Flagyl  empirically 3 days prior to admission, and CT scan was obtained on the  day of admission.  This shows significant sigmoid colon diverticulitis  with a pericolonic abscess about 4.2 cm.  The patient was referred for  further evaluation treatment and is admitted.   PAST MEDICAL HISTORY:  History of anal fistulotomy a number of years ago  and underwent colonoscopy 2 years ago.  No medical conditions.   MEDICATIONS:  None.   ALLERGIES:  None.   SOCIAL HISTORY, FAMILY HISTORY, REVIEW OF SYSTEMS:  See detailed H and  P.   PERTINENT PHYSICAL EXAMINATION:  He was afebrile.  Vital signs within  normal limits.  GENERAL:  Mildly overweight white male who does not appear acutely ill.  Pertinent findings limited to the abdomen which revealed moderate  obesity, tenderness in the left lower quadrant and a definite palpable  mass.  No peritoneal signs, minimal guarding.   LABORATORY:  White count was 9.4, hemoglobin 13.  CT the abdomen  and  pelvis as above.   HOSPITAL COURSE:  The patient was then admitted, placed on IV Unasyn.  Interventional radiology was consulted for percutaneous drainage.  However, at the time he was taken down to CT on January 29, there was  significantly decreased pericolonic fluid not felt to be significant at  this point and decreased inflammation.  Therefore, he did not undergo  any percutaneous drainage but continued on IV antibiotics.  He improved  steadily over the next 3 days, and by February 1, he was pain free.  He  no longer had a palpable mass but some mild fullness  in the left lower quadrant and minimal tenderness.  He is discharged  home on Cipro and Flagyl which he will continue for another week.  He is  to call for any worsening symptoms.  Otherwise return to the office for  follow-up in 2-3 weeks.      Lorne Skeens. Hoxworth, M.D.  Electronically Signed     BTH/MEDQ  D:  07/25/2008  T:  07/25/2008  Job:  [id number]   cc:   Balinda Quails.  Lovell Sheehan, MD  [address] [place]  [place] [address]

## 2010-09-28 NOTE — Assessment & Plan Note (Signed)
[facility]                           GASTROENTEROLOGY OFFICE NOTE   NAME:[name] [name], [name] [name]                       MRN:          [id number]  DATE:02/11/2006                            DOB:          12/27/1960    REASON FOR CONSULTATION:  Change in bowel habits, recurrent diverticulitis.   ASSESSMENT:  This 50 year old white man who has had problems for at least  six to nine months, with changes in bowel habits and crampy left lower  quadrant pain.  He had confirmation of diverticulitis on July 9,2007, CT.  Wall thickening and pericolonic stranding at the junction of the sigmoid and  descending colon.  He feels better but still has some problems.  He has had  one course of Cipro and Flagyl at the end of June or around July with  relief.   PLAN:  1. Colonoscopy, to investigate for persistent damage, a tumor as a trigger      of this.  2. Prior to that he will take Cipro 500 mg b.i.d. and Flagyl 500 mg b.i.d.      for 10 days.  3. He only has mild left lower quadrant tenderness on exam today.  If he      has persistent problems or worsening problems, he is to contact me      before the colonoscopy, and understands that.  4. Subsequent to that, it may be appropriate to see a surgeon about long-      term management of his diverticulitis with potential resection of the      diseased area.  It is a little bit of a difficult call since we only      have one confirmed episode on CT scanning, but at his age it is clearly      something to think about.  This was all explained to the patient,      including the risks, benefits and indications of a colonoscopy.  He      understands and agrees to proceed.   HISTORY:  This 50 year old white man with problems as described above.  Please see the medical history and physical form for full details.   REVIEW OF SYSTEMS:  His GI review of systems is otherwise negative.  There  is no bleeding.  He has had some  constipation difficulty and intermittent  left lower quadrant pain.  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  He denies fever, and his comprehensive review  of systems is otherwise negative except for some occasional mild dysuria  associated with this.   PAST MEDICAL HISTORY:  1. Nephrolithiasis.  2. Anal fistula.  He had a sigmoidoscopy exam by Dr. Stacie Glaze in      February 1994.   MEDICATIONS:  None.   FAMILY HISTORY:  No colon cancer.   SOCIAL HISTORY:  He is single.  Some alcohol.  No tobacco or drugs.  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   FAMILY HISTORY:  Pertinent for heart disease.   PHYSICAL EXAMINATION:  GENERAL:  A pleasant, overweight to obese white male.  Height 5 feet 10 inches, weight 239 pounds, blood pressure 132/90, pulse 72.  HEENT:  Eyes anicteric.  Mouth:  Posterior pharynx free of lesions.  NECK:  Supple without mass or thyromegaly.  CHEST:  Clear, resonant.  HEART:  S1, S2, no murmurs or gallops.  ABDOMEN:  Soft, obese.  Bowel sounds present.  He is tender in the left  lower quadrant to deep palpation.  There is a slight fullness there.  There  is no organomegaly or mass otherwise.  NODES:  No neck or supraclavicular nodes.  EXTREMITIES:  Trace peripheral edema bilaterally. There is a tattoo on the  right ankle.  SKIN:  Lower extremities and trunk free of rash, warm and dry.  NEUROLOGIC/PSYCH:  He is alert and oriented x3.   I appreciate the opportunity to care for this patient.       Iva Boop, MD,FACG      CEG/MedQ  DD:  02/11/2006  DT:  02/12/2006  Job #:  [id number]   cc:   Stacie Glaze, MD

## 2010-09-28 NOTE — Discharge Summary (Signed)
[name] [name], [name] [name]                ACCOUNT NO.:  [id number]   MEDICAL RECORD NO.:  [id number]          PATIENT TYPE:  INP   LOCATION:  1532                         [facility]ACILITY:  [facility]   PHYSICIAN:  Sharlet Salina T. Hoxworth, M.D.DATE OF BIRTH:  05-28-1960   DATE OF ADMISSION:  09/14/2008  DATE OF DISCHARGE:  09/19/2008                               DISCHARGE SUMMARY   DISCHARGE DIAGNOSIS:  Diverticulitis of the sigmoid colon.   SURGICAL PROCEDURE:  Laparoscopic-assisted sigmoid colectomy Sep 14, 2008.   HISTORY OF PRESENT ILLNESS:  [name] is a 50 year old male who was  hospitalized from June 08, 2008, to June 13, 2008, for  diverticulitis of the sigmoid colon with a pericolonic abscess, resolved  by CT scan on IV antibiotics.  He improved and was discharged home.  He,  however, returned in March 2010 with continued discomfort in the left  lower quadrant and a recurrent palpable mass associated with the sigmoid  colon.  Due to persistence of significant inflammatory change despite  long-term IV and p.o. antibiotics and continued symptoms, we have  recommended proceeding with sigmoid colectomy.  He is now admitted for  this procedure.   PAST MEDICAL/SURGICAL HISTORY:  Generally unremarkable without serious  illness.  He had a colonoscopy 2 years ago.  He has had an anal  fistulotomy.  Medical:  No illnesses or hospitalizations.   MEDICATIONS:  None.   ALLERGIES:  None.   SOCIAL HISTORY:  Works as a Financial controller.  No cigarettes or alcohol.   Physical exam was pertinent for a tender palpable mass in the left lower  quadrant.   HOSPITAL COURSE:  Following a mechanical antibiotic bowel prep at home,  the patient was admitted on the morning of his procedure and underwent  an uneventful laparoscopic-assisted sigmoid colectomy with finding of a  large inflammatory mass within the sigmoid colon.  Postoperatively, he  initially did very well feeling well on the first postoperative  day, and  his Foley was removed.  He was started on a clear liquid diet.  On the  second postoperative day, he was noted to be moderately distended  consistent with ileus, and he was left on clear liquids.  He did have  some vomiting on the third postoperative day and remained on sips of  clear liquids.  By the fourth postoperative day, he was feeling better,  had passed lots of flatus and stool, and his diet was gradually  advanced.  By Sep 19, 2008, he was tolerating full liquid diet without  difficulty and had had several bowel movements.  Abdomen was soft.  Wounds healing well.  White count was 7000.  Final pathology revealed  acute diverticulitis with localized perforation and mesenteric abscess.   DISCHARGE MEDICATIONS:  Tylox for pain.   FOLLOWUP:  To be in my office in 1 week for staple removal.      Sharlet Salina T. Hoxworth, M.D.  Electronically Signed     BTH/MEDQ  D:  10/17/2008  T:  10/17/2008  Job:  [id number]   cc:   Stacie Glaze, MD  [phone]  [address]  [place]  [place] [address]

## 2011-01-24 ENCOUNTER — Other Ambulatory Visit: Payer: Self-pay | Admitting: *Deleted

## 2011-01-24 MED ORDER — LEVOFLOXACIN 500 MG PO TABS: 500.0000 mg | ORAL_TABLET | Freq: Every day | ORAL | Status: AC

## 2011-04-03 ENCOUNTER — Encounter: Payer: Self-pay | Admitting: Internal Medicine

## 2011-10-10 ENCOUNTER — Other Ambulatory Visit: Payer: Self-pay | Admitting: *Deleted

## 2011-10-10 MED ORDER — LEVOFLOXACIN 500 MG PO TABS: 500.0000 mg | ORAL_TABLET | Freq: Every day | ORAL | Status: AC

## 2012-02-05 ENCOUNTER — Encounter: Payer: Self-pay | Admitting: Internal Medicine

## 2012-05-09 ENCOUNTER — Encounter: Payer: Self-pay | Admitting: Internal Medicine

## 2012-05-09 ENCOUNTER — Ambulatory Visit (INDEPENDENT_AMBULATORY_CARE_PROVIDER_SITE_OTHER): Payer: BC Managed Care – PPO | Admitting: Internal Medicine

## 2012-05-09 VITALS — BP 150/106 | HR 96 | Temp 98.0°F | Ht 69.75 in | Wt 261.0 lb

## 2012-05-09 DIAGNOSIS — J329 Chronic sinusitis, unspecified: Secondary | ICD-10-CM

## 2012-05-09 DIAGNOSIS — R030 Elevated blood-pressure reading, without diagnosis of hypertension: Secondary | ICD-10-CM

## 2012-05-09 DIAGNOSIS — H9209 Otalgia, unspecified ear: Secondary | ICD-10-CM

## 2012-05-09 DIAGNOSIS — H9202 Otalgia, left ear: Secondary | ICD-10-CM

## 2012-05-09 DIAGNOSIS — I10 Essential (primary) hypertension: Secondary | ICD-10-CM | POA: Insufficient documentation

## 2012-05-09 MED ORDER — AMOXICILLIN 500 MG PO CAPS: 500.0000 mg | ORAL_CAPSULE | Freq: Three times a day (TID) | ORAL | Status: DC

## 2012-05-09 NOTE — Progress Notes (Signed)
Chief Complaint  Patient presents with  . Sinusitis  . Otalgia    left    HPI: Patient comes in today for SDA  Sat clinic for  new problem evaluation. Onset  3 days ago   And had low grade temp 101 at onset and then got  Ear pain and neck left  .  Taking  Advil.   Left ear pain tight into neck and gurgling when try to equalize .  Sinus infection.  Uses  netti pot     Exposed to cats   Some allergy   Had flu shot this  Year.   ROS: See pertinent positives and negatives per HPI.  Past Medical History  Diagnosis Date  . Diverticulitis   . Nephrolithiasis   . Anal fistula     Family History  Problem Relation Age of Onset  . Heart disease Other     History   Social History  . Marital Status: Single    Spouse Name: N/A    Number of Children: N/A  . Years of Education: N/A   Social History Main Topics  . Smoking status: Current Some Day Smoker    Types: Cigarettes  . Smokeless tobacco: None  . Alcohol Use: Yes  . Drug Use: No  . Sexually Active: None   Other Topics Concern  . None   Social History Narrative  . None    Outpatient Encounter Prescriptions as of 05/09/2012  Medication Sig Dispense Refill  . amoxicillin (AMOXIL) 500 MG capsule Take 1 capsule (500 mg total) by mouth 3 (three) times daily.  30 capsule  0    EXAM:  BP 150/106  Pulse 96  Temp 98 F (36.7 C) (Oral)  Ht 5' 9.75" (1.772 m)  Wt 261 lb (118.389 kg)  BMI 37.72 kg/m2  SpO2 98%  Body mass index is 37.72 kg/(m^2).  GENERAL: vitals reviewed and listed above, alert, oriented, appears well hydrated and in no acute distress See bp readings  HEENT: atraumatic, conjunctiva  clear, no obvious abnormalities on inspection of external nose and ears   Left tm grey intact but partly obsc bu wax deep in canal brown  No pain   Left eac and tm nl  Face poss tender  And left neck OP : no lesion edema or exudate  Red left more than right no edema  NECK: no obvious masses on inspection palpation   LUNGS:  clear to auscultation bilaterally, no wheezes, rales or rhonchi, good air movement  CV: HRRR, no clubbing cyanosis or  peripheral edema nl cap refill   MS: moves all extremities without noticeable focal  abnormality  PSYCH: pleasant and cooperative, no obvious depression or anxiety  ASSESSMENT AND PLAN:  Discussed the following assessment and plan:  1. Left ear pain   2. Elevated blood pressure reading   3. Sinusitis    ? left with eustachian tube dysfunction  is flight attendant  Empiric rx for now because of risk and onset  Concern about bp readings   May need medication last reading in record was 2 year ago and was 140/90   -Patient advised to return or notify health care team  immediately if symptoms worsen or persist or new concerns arise.  Patient Instructions  You may have a left sinusitis   Usually advise decongestants but would not do this because your blood pressure is high.   Can use nasal saline and afrin nose spray for 2-3 days.  Can add antibiotic  Also .  Do not use q tips in the ears   Left ear has some wax deep in the canal can use ear wax softening drops and if not getting better can have it flushed but this doesn't appear to be the cause of your sx today.   Get a bp monitor and check readings  Over the next 1-2 weeks. You may need to be on medication for this. FU with Dr Leretha Pol in the next 2 weeks about your blood pressure.   How to Take Your Blood Pressure  These instructions are only for electronic home blood pressure machines. You will need:   An automatic or semi-automatic blood pressure machine.  Fresh batteries for the blood pressure machine. HOW DO I USE THESE TOOLS TO CHECK MY BLOOD PRESSURE?   There are 2 numbers that make up your blood pressure. For example: 120/80.  The first number (120 in our example) is called the "systolic pressure." It is a measure of the pressure in your blood vessels when your heart is pumping blood.  The second number (80  in our example) is called the "diastolic pressure." It is a measure of the pressure in your blood vessels when your heart is resting between beats.  Before you buy a home blood pressure machine, check the size of your arm so you can buy the right size cuff. Here is how to check the size of your arm:  Use a tape measure that shows both inches and centimeters.  Wrap the tape measure around the middle upper part of your arm. You may need someone to help you measure right.  Write down your arm measurement in both inches and centimeters.  To measure your blood pressure right, it is important to have the right size cuff.  If your arm is up to 13 inches (37 to 34 centimeters), get an adult cuff size.  If your arm is 13 to 17 inches (35 to 44 centimeters), get a large adult cuff size.  If your arm is 17 to 20 inches (45 to 52 centimeters), get an adult thigh cuff.  Try to rest or relax for at least 30 minutes before you check your blood pressure.  Do not smoke.  Do not have any drinks with caffeine, such as:  Pop.  Coffee.  Tea.  Check your blood pressure in a quiet room.  Sit down and stretch out your arm on a table. Keep your arm at about the level of your heart. Let your arm relax. GETTING BLOOD PRESSURE READINGS  Make sure you remove any tight-fighting clothing from your arm. Wrap the cuff around your upper arm. Wrap it just above the bend, and above where you felt the pulse. You should be able to slip a finger between the cuff and your arm. If you cannot slip a finger in the cuff, it is too tight and should be removed and rewrapped.  Some units requires you to manually pump up the arm cuff.  Automatic units inflate the cuff when you press a button.  Cuff deflation is automatic in both models.  After the cuff is inflated, the unit measures your blood pressure and pulse. The readings are displayed on a monitor. Hold still and breathe normally while the cuff is  inflated.  Getting a reading takes less than a minute.  Some models store readings in a memory. Some provide a printout of readings.  Get readings at different times of the day. You should wait at least  5 minutes between readings. Take readings with you to your next doctor's visit. Document Released: 04/11/2008 Document Revised: 07/22/2011 Document Reviewed: 04/11/2008 [facility] Patient Information 2013 [place], [place].         Neta Mends. Panosh M.D.

## 2012-05-09 NOTE — Patient Instructions (Signed)
You may have a left sinusitis   Usually advise decongestants but would not do this because your blood pressure is high.   Can use nasal saline and afrin nose spray for 2-3 days.  Can add antibiotic  Also .  Do not use q tips in the ears   Left ear has some wax deep in the canal can use ear wax softening drops and if not getting better can have it flushed but this doesn't appear to be the cause of your sx today.   Get a bp monitor and check readings  Over the next 1-2 weeks. You may need to be on medication for this. FU with Dr Leretha Pol in the next 2 weeks about your blood pressure.   How to Take Your Blood Pressure  These instructions are only for electronic home blood pressure machines. You will need:   An automatic or semi-automatic blood pressure machine.  Fresh batteries for the blood pressure machine. HOW DO I USE THESE TOOLS TO CHECK MY BLOOD PRESSURE?   There are 2 numbers that make up your blood pressure. For example: 120/80.  The first number (120 in our example) is called the "systolic pressure." It is a measure of the pressure in your blood vessels when your heart is pumping blood.  The second number (80 in our example) is called the "diastolic pressure." It is a measure of the pressure in your blood vessels when your heart is resting between beats.  Before you buy a home blood pressure machine, check the size of your arm so you can buy the right size cuff. Here is how to check the size of your arm:  Use a tape measure that shows both inches and centimeters.  Wrap the tape measure around the middle upper part of your arm. You may need someone to help you measure right.  Write down your arm measurement in both inches and centimeters.  To measure your blood pressure right, it is important to have the right size cuff.  If your arm is up to 13 inches (37 to 34 centimeters), get an adult cuff size.  If your arm is 13 to 17 inches (35 to 44 centimeters), get a large adult cuff  size.  If your arm is 17 to 20 inches (45 to 52 centimeters), get an adult thigh cuff.  Try to rest or relax for at least 30 minutes before you check your blood pressure.  Do not smoke.  Do not have any drinks with caffeine, such as:  Pop.  Coffee.  Tea.  Check your blood pressure in a quiet room.  Sit down and stretch out your arm on a table. Keep your arm at about the level of your heart. Let your arm relax. GETTING BLOOD PRESSURE READINGS  Make sure you remove any tight-fighting clothing from your arm. Wrap the cuff around your upper arm. Wrap it just above the bend, and above where you felt the pulse. You should be able to slip a finger between the cuff and your arm. If you cannot slip a finger in the cuff, it is too tight and should be removed and rewrapped.  Some units requires you to manually pump up the arm cuff.  Automatic units inflate the cuff when you press a button.  Cuff deflation is automatic in both models.  After the cuff is inflated, the unit measures your blood pressure and pulse. The readings are displayed on a monitor. Hold still and breathe normally while the cuff  is inflated.  Getting a reading takes less than a minute.  Some models store readings in a memory. Some provide a printout of readings.  Get readings at different times of the day. You should wait at least 5 minutes between readings. Take readings with you to your next doctor's visit. Document Released: 04/11/2008 Document Revised: 07/22/2011 Document Reviewed: 04/11/2008 [facility] Patient Information 2013 [place], [place].

## 2012-06-12 ENCOUNTER — Other Ambulatory Visit: Payer: Self-pay | Admitting: Internal Medicine

## 2012-06-12 DIAGNOSIS — K429 Umbilical hernia without obstruction or gangrene: Secondary | ICD-10-CM

## 2012-06-15 ENCOUNTER — Encounter (INDEPENDENT_AMBULATORY_CARE_PROVIDER_SITE_OTHER): Payer: Self-pay | Admitting: General Surgery

## 2012-06-15 ENCOUNTER — Ambulatory Visit (INDEPENDENT_AMBULATORY_CARE_PROVIDER_SITE_OTHER): Payer: BC Managed Care – PPO | Admitting: General Surgery

## 2012-06-15 VITALS — BP 168/108 | HR 104 | Resp 20 | Ht 70.0 in | Wt 264.0 lb

## 2012-06-15 DIAGNOSIS — K432 Incisional hernia without obstruction or gangrene: Secondary | ICD-10-CM

## 2012-06-15 NOTE — Patient Instructions (Signed)
Stop smoking Try to lose weight - increase exercise and cut back on calories Consider using My Fitness Pal smart application  Hernia A hernia occurs when an internal organ pushes out through a weak spot in the abdominal wall. Hernias most commonly occur in the groin and around the navel. Hernias often can be pushed back into place (reduced). Most hernias tend to get worse over time. Some abdominal hernias can get stuck in the opening (irreducible or incarcerated hernia) and cannot be reduced. An irreducible abdominal hernia which is tightly squeezed into the opening is at risk for impaired blood supply (strangulated hernia). A strangulated hernia is a medical emergency. Because of the risk for an irreducible or strangulated hernia, surgery may be recommended to repair a hernia. CAUSES   Heavy lifting.  Prolonged coughing.  Straining to have a bowel movement.  A cut (incision) made during an abdominal surgery. HOME CARE INSTRUCTIONS   Bed rest is not required. You may continue your normal activities.  Avoid lifting more than 10 pounds (4.5 kg) or straining.  Cough gently. If you are a smoker it is best to stop. Even the best hernia repair can break down with the continual strain of coughing. Even if you do not have your hernia repaired, a cough will continue to aggravate the problem.  Do not wear anything tight over your hernia. Do not try to keep it in with an outside bandage or truss. These can damage abdominal contents if they are trapped within the hernia sac.  Eat a normal diet.  Avoid constipation. Straining over long periods of time will increase hernia size and encourage breakdown of repairs. If you cannot do this with diet alone, stool softeners may be used. SEEK IMMEDIATE MEDICAL CARE IF:   You have a fever.  You develop increasing abdominal pain.  You feel nauseous or vomit.  Your hernia is stuck outside the abdomen, looks discolored, feels hard, or is tender.  You  have any changes in your bowel habits or in the hernia that are unusual for you.  You have increased pain or swelling around the hernia.  You cannot push the hernia back in place by applying gentle pressure while lying down. MAKE SURE YOU:   Understand these instructions.  Will watch your condition.  Will get help right away if you are not doing well or get worse. Document Released: 04/29/2005 Document Revised: 07/22/2011 Document Reviewed: 12/17/2007 [facility] Patient Information 2013 [place], [place].

## 2012-06-15 NOTE — Progress Notes (Signed)
Patient ID: [name] [name], male   DOB: 08-08-1960, 52 y.o.   MRN: [id number]  Chief Complaint  Patient presents with  . Umbilical Hernia    HPI [name] [name] is a 52 y.o. male.   HPI 52 yo WM referred by Dr Lovell Sheehan for evaluation of umbilical hernia. The patient underwent a laparoscopic assisted sigmoid colectomy in 2010 for diverticulitis. He states that he was diagnosed with an umbilical hernia about a year ago however he was asymptomatic at that time. He states about a week ago he became constipated and then developed a bulge at his umbilicus that was very painful. He states that it felt like somebody was sticking him with a knife. He also developed some nausea. The discomfort went away after about a day. Since that time he has had regular bowel movements. He denies any vomiting. He denies any fever. He denies any melena or hematochezia. He denies any weight loss. He works as a Financial controller. He does smoke but only on social occasions. He states that he does not buy cigarettes he generally just bums a cigarette. He does go to the gym and is trying to work on his weight. He states that his blood pressure has been elevated recently and he is in the process of seeing his primary care physician to discuss his hypertension. He is not having abdominal pain since that day. Past Medical History  Diagnosis Date  . Diverticulitis   . Nephrolithiasis   . Hypertension     Past Surgical History  Procedure Date  . Colectomy 09/2008    Dr Johna Sheriff    Family History  Problem Relation Age of Onset  . Heart disease Other     Social History History  Substance Use Topics  . Smoking status: Current Some Day Smoker    Types: Cigarettes  . Smokeless tobacco: Not on file  . Alcohol Use: Yes     Comment: occasionally    No Known Allergies  No current outpatient prescriptions on file.    Review of Systems Review of Systems  Constitutional: Negative for fever, chills, appetite change and  unexpected weight change.  HENT: Negative for congestion and trouble swallowing.   Eyes: Negative for visual disturbance.  Respiratory: Negative for chest tightness and shortness of breath.   Cardiovascular: Negative for chest pain and leg swelling.       No PND, no orthopnea, no DOE  Gastrointestinal:       See HPI  Genitourinary: Negative for dysuria and hematuria.  Musculoskeletal: Negative.   Skin: Negative for rash.  Neurological: Negative for seizures and speech difficulty.  Hematological: Does not bruise/bleed easily.  Psychiatric/Behavioral: Negative for behavioral problems and confusion.    Blood pressure 168/108, pulse 104, resp. rate 20, height 5\' 10"  (1.778 m), weight 264 lb (119.75 kg).  Physical Exam Physical Exam  Vitals reviewed. Constitutional: He is oriented to person, place, and time. He appears well-developed and well-nourished. No distress.       obese  HENT:  Head: Normocephalic and atraumatic.  Right Ear: External ear normal.  Left Ear: External ear normal.  Eyes: Conjunctivae normal are normal. No scleral icterus.  Neck: Normal range of motion. Neck supple. No tracheal deviation present.  Cardiovascular: Normal rate, regular rhythm and normal heart sounds.   Pulmonary/Chest: Effort normal and breath sounds normal. No stridor. No respiratory distress. He has no wheezes.  Abdominal: Soft. Bowel sounds are normal. He exhibits no distension. There is no tenderness. There is  no rebound and no guarding. A hernia is present. Hernia confirmed positive in the ventral area.         Reducible umbilical incisional hernia. Soft, defect about 2cm  Musculoskeletal: Normal range of motion. He exhibits no edema and no tenderness.  Lymphadenopathy:    He has no cervical adenopathy.  Neurological: He is alert and oriented to person, place, and time.  Skin: Skin is warm and dry. No rash noted. He is not diaphoretic. No erythema. No pallor.  Psychiatric: He has a normal  mood and affect. His behavior is normal. Judgment and thought content normal.    Data Reviewed Dr. Jamse Mead op note 2010  Assessment    Umbilical incisional hernia Obesity Tobacco use Hypertension    Plan    We discussed the etiology of ventral incisional hernias. We discussed the signs and symptoms of incarceration and strangulation. The patient was given educational material. I also drew diagrams.  We discussed nonoperative and operative management. With respect to operative management, we discussed both open repair and laparoscopic repair. We discussed the pros and cons of each approach. I discussed the typical aftercare with each procedure and how each procedure differs.  The patient is leaning towards a laparoscopic repair  We discussed the risk and benefits of surgery including but not limited to bleeding, infection, injury to surrounding structures, hernia recurrence, mesh complications, hematoma/seroma formation, need to convert to an open procedure, blood clot formation, urinary retention, post operative ileus, general anesthesia risk, long-term abdominal pain. We discussed that this procedure can be quite uncomfortable and difficult to recover from based on how the mesh is secured to the abdominal wall. We discussed the importance of avoiding heavy lifting and straining for a period of 6 weeks.  However his blood pressure today was 168/108. I told him that his blood pressure needs to be evaluated and treated prior to his hernia repair. He states that he is going to follow up with his primary care physician to address his blood pressure. We also discussed that he is at higher risk for recurrence due to his obesity and occasional cigarette smoking. I strongly advised him to stop smoking which he says he can do. With respect to his weight I advised him to continue to go to the gym. We also discussed the importance of dieting.  With respect to timing of surgery, it would be ideal  for him to lose at least 30 pounds or more in order to decrease his hernia recurrence risk. However while I am hopeful that he may be successful with weight loss I am not sure he can achieve that objective in the next several months. I am concerned with him having another episode like he had the other week. I think realistically we can shoot for surgery Within the next 8-12 weeks which will give him time to address his blood pressure and to start losing some weight.  He is to followup same with his primary care physician to address his blood pressure and see me in about 6 weeks to finalize surgical plans. In the interim we will get a CT scan of his abdomen and pelvis to delineate his abdominal wall anatomy to confirm his hernia size as well as to make sure there is no evidence of other incisional hernias  Mary Sella. Andrey Campanile, MD, FACS General, Bariatric, & Minimally Invasive Surgery [facility], [place]         [facility] M 06/15/2012, 12:45 PM

## 2012-06-17 ENCOUNTER — Telehealth: Payer: Self-pay | Admitting: *Deleted

## 2012-06-17 ENCOUNTER — Other Ambulatory Visit: Payer: BC Managed Care – PPO

## 2012-06-17 NOTE — Telephone Encounter (Signed)
Scheduled lab on Monday am to be followed by scan and then ov with dr Lovell Sheehan on 4:15

## 2012-06-22 ENCOUNTER — Encounter: Payer: Self-pay | Admitting: Internal Medicine

## 2012-06-22 ENCOUNTER — Ambulatory Visit
Admission: RE | Admit: 2012-06-22 | Discharge: 2012-06-22 | Disposition: A | Discharge status: Home / Self Care | Payer: BC Managed Care – PPO | Source: Ambulatory Visit | Attending: General Surgery | Admitting: General Surgery

## 2012-06-22 ENCOUNTER — Other Ambulatory Visit (INDEPENDENT_AMBULATORY_CARE_PROVIDER_SITE_OTHER): Payer: BC Managed Care – PPO

## 2012-06-22 ENCOUNTER — Ambulatory Visit (INDEPENDENT_AMBULATORY_CARE_PROVIDER_SITE_OTHER): Payer: BC Managed Care – PPO | Admitting: Internal Medicine

## 2012-06-22 VITALS — BP 144/84 | HR 88 | Temp 98.2°F | Resp 16 | Ht 70.0 in | Wt 258.0 lb

## 2012-06-22 DIAGNOSIS — I10 Essential (primary) hypertension: Secondary | ICD-10-CM

## 2012-06-22 DIAGNOSIS — K432 Incisional hernia without obstruction or gangrene: Secondary | ICD-10-CM

## 2012-06-22 DIAGNOSIS — E039 Hypothyroidism, unspecified: Secondary | ICD-10-CM

## 2012-06-22 LAB — CBC WITH DIFFERENTIAL/PLATELET
Basophils Relative: 0.6 % (ref 0.0–3.0)
Eosinophils Relative: 3.8 % (ref 0.0–5.0)
HCT: 46.5 % (ref 39.0–52.0)
Hemoglobin: 16.2 g/dL (ref 13.0–17.0)
Lymphs Abs: 1.7 10*3/uL (ref 0.7–4.0)
MCV: 95.2 fl (ref 78.0–100.0)
Monocytes Absolute: 0.4 10*3/uL (ref 0.1–1.0)
Monocytes Relative: 10.1 % (ref 3.0–12.0)
RBC: 4.88 Mil/uL (ref 4.22–5.81)
WBC: 4.4 10*3/uL — ABNORMAL LOW (ref 4.5–10.5)

## 2012-06-22 LAB — COMPREHENSIVE METABOLIC PANEL
Albumin: 4 g/dL (ref 3.5–5.2)
Alkaline Phosphatase: 56 U/L (ref 39–117)
BUN: 13 mg/dL (ref 6–23)
CO2: 24 mEq/L (ref 19–32)
GFR: 99.91 mL/min (ref >60.00)
Glucose, Bld: 109 mg/dL — ABNORMAL HIGH (ref 70–99)
Total Bilirubin: 0.9 mg/dL (ref 0.3–1.2)

## 2012-06-22 LAB — TSH: TSH: 0.92 u[IU]/mL (ref 0.35–5.50)

## 2012-06-22 MED ORDER — IOHEXOL 300 MG/ML  SOLN
125.0000 mL | Freq: Once | INTRAMUSCULAR | Status: AC | PRN
  Administered 2012-06-22: 125 mL via INTRAVENOUS

## 2012-06-22 MED ORDER — OLMESARTAN MEDOXOMIL 20 MG PO TABS: 20.0000 mg | ORAL_TABLET | Freq: Every day | ORAL | Status: DC

## 2012-06-22 NOTE — Progress Notes (Signed)
  Subjective:    Patient ID: [name] [name], male    DOB: [birth year], 52 y.o.   MRN: [id number]  HPI  New on set HTN New umbilical hernia and pain Small umbilical hernia enlarging in size patient has some pain and bowel sounds present hernia.  Blood pressure was detected at the time he was evaluated for the hernia by digital surgeon this is a new diagnosis he has had no chest pain shortness of breath PND orthopnea is no prior history of hypertension but he has a significant weight gain.   Review of Systems  Constitutional: Negative for fever and fatigue.  HENT: Negative for hearing loss, congestion, neck pain and postnasal drip.   Eyes: Negative for discharge, redness and visual disturbance.  Respiratory: Negative for cough, shortness of breath and wheezing.   Cardiovascular: Negative for leg swelling.  Gastrointestinal: Negative for abdominal pain, constipation and abdominal distention.  Genitourinary: Negative for urgency and frequency.  Musculoskeletal: Negative for joint swelling and arthralgias.  Skin: Negative for color change and rash.  Neurological: Negative for weakness and light-headedness.  Hematological: Negative for adenopathy.  Psychiatric/Behavioral: Negative for behavioral problems.   Past Medical History  Diagnosis Date  . Diverticulitis   . Nephrolithiasis   . Hypertension     History   Social History  . Marital Status: Single    Spouse Name: N/A    Number of Children: N/A  . Years of Education: N/A   Occupational History  . Not on file.   Social History Main Topics  . Smoking status: Current Some Day Smoker    Types: Cigarettes  . Smokeless tobacco: Not on file  . Alcohol Use: Yes     Comment: occasionally  . Drug Use: No  . Sexually Active: Not on file   Other Topics Concern  . Not on file   Social History Narrative   Flight attendant  Non smoker     Past Surgical History  Procedure Laterality Date  . Colectomy  09/2008    Dr Johna Sheriff     Family History  Problem Relation Age of Onset  . Heart disease Other     No Known Allergies  No current outpatient prescriptions on file prior to visit.   No current facility-administered medications on file prior to visit.    BP 144/84  Pulse 88  Temp(Src) 98.2 F (36.8 C)  Resp 16  Ht 5\' 10"  (1.778 m)  Wt 258 lb (117.028 kg)  BMI 37.02 kg/m2       Objective:   Physical Exam  Constitutional: He appears well-developed and well-nourished.  HENT:  Head: Normocephalic and atraumatic.  Eyes: Conjunctivae are normal. Pupils are equal, round, and reactive to light.  Neck: Normal range of motion. Neck supple.  Cardiovascular: Normal rate and regular rhythm.   Pulmonary/Chest: Effort normal and breath sounds normal.  Abdominal: Soft. Bowel sounds are normal.          Assessment & Plan:  New onset HTN and new enlarging umbilical hernia Small bowel in hernia without entrapment Surgery in 6 weeks once blood pressure in good control Diet discussed and samples of benicar 20 with blood pressure

## 2012-06-22 NOTE — Patient Instructions (Signed)
The patient is instructed to continue all medications as prescribed. Schedule followup with check out clerk upon leaving the clinic Check blood pressure with cuff twice a week and call if readings are not below 140/80 in three weeks

## 2012-06-25 ENCOUNTER — Ambulatory Visit (INDEPENDENT_AMBULATORY_CARE_PROVIDER_SITE_OTHER): Payer: Self-pay | Admitting: General Surgery

## 2012-07-24 ENCOUNTER — Encounter (INDEPENDENT_AMBULATORY_CARE_PROVIDER_SITE_OTHER): Payer: Self-pay | Admitting: General Surgery

## 2012-07-24 ENCOUNTER — Ambulatory Visit (INDEPENDENT_AMBULATORY_CARE_PROVIDER_SITE_OTHER): Payer: BC Managed Care – PPO | Admitting: General Surgery

## 2012-07-24 VITALS — BP 140/82 | HR 90 | Temp 97.7°F | Resp 18 | Ht 60.75 in | Wt 261.0 lb

## 2012-07-24 DIAGNOSIS — K432 Incisional hernia without obstruction or gangrene: Secondary | ICD-10-CM

## 2012-07-24 NOTE — Progress Notes (Signed)
Patient ID: [name] [name], male   DOB: [birth year], 52 y.o.   MRN: [id number]  Chief Complaint  Patient presents with  . Follow-up    incisional hernia    HPI EDWORD CU is a 52 y.o. male.   HPI 52 year old Caucasian male comes in to discuss his CT scan results and to followup on his blood pressure. I initially saw him on 06/15/2012. At that time we discovered he had a incisional ventral hernia around his umbilicus. He was also found to be very hypertensive with a blood pressure 160s over 100s. He has been started on oral antihypertensive medication by his primary care physician. He reports compliance with this medication. We also performed a CT scan of his abdomen and pelvis further delineate his abdominal wall anatomy. He reports he had one mild episode of discomfort around his umbilicus this past Monday. He describes it as a flareup. He states that he felt little bit nauseous that morning. He reports daily bowel movements. He denies any melena or hematochezia. He denies any vomiting. He has stopped smoking. He reports a several pound weight loss.   Past Medical History  Diagnosis Date  . Diverticulitis   . Nephrolithiasis   . Hypertension     Past Surgical History  Procedure Laterality Date  . Colectomy  09/2008    Dr Johna Sheriff    Family History  Problem Relation Age of Onset  . Heart disease Other     Social History History  Substance Use Topics  . Smoking status: Current Some Day Smoker    Types: Cigarettes  . Smokeless tobacco: Not on file  . Alcohol Use: Yes     Comment: occasionally    No Known Allergies  Current Outpatient Prescriptions  Medication Sig Dispense Refill  . olmesartan (BENICAR) 20 MG tablet Take 1 tablet (20 mg total) by mouth daily.  30 tablet  5   No current facility-administered medications for this visit.    Review of Systems Review of Systems  Constitutional: Negative for fever, chills, appetite change and unexpected weight change.    HENT: Negative for congestion and trouble swallowing.   Eyes: Negative for visual disturbance.  Respiratory: Negative for chest tightness and shortness of breath.   Cardiovascular: Negative for chest pain and leg swelling.       No PND, no orthopnea, no DOE  Gastrointestinal:       See HPI  Genitourinary: Negative for dysuria and hematuria.  Musculoskeletal: Negative.   Skin: Negative for rash.  Neurological: Negative for seizures and speech difficulty.  Hematological: Does not bruise/bleed easily.  Psychiatric/Behavioral: Negative for behavioral problems and confusion.    Blood pressure 140/82, pulse 90, temperature 97.7 F (36.5 C), resp. rate 18, height 5' 0.75" (1.543 m), weight 261 lb (118.389 kg).  Physical Exam Physical Exam  Vitals reviewed. Constitutional: He is oriented to person, place, and time. He appears well-developed and well-nourished. No distress.  obese  HENT:  Head: Normocephalic and atraumatic.  Right Ear: External ear normal.  Left Ear: External ear normal.  Eyes: Conjunctivae are normal. No scleral icterus.  Neck: Normal range of motion. Neck supple. No tracheal deviation present. No thyromegaly present.  Cardiovascular: Normal rate, regular rhythm and normal heart sounds.   Pulmonary/Chest: Effort normal and breath sounds normal. No respiratory distress. He has no wheezes.  Abdominal: Soft. He exhibits no distension. There is no tenderness. There is no rebound and no guarding. A hernia is present. Hernia confirmed positive in the  ventral area.    Musculoskeletal: He exhibits no edema.  Lymphadenopathy:    He has no cervical adenopathy.  Neurological: He is alert and oriented to person, place, and time.  Skin: Skin is warm and dry. No rash noted. He is not diaphoretic. No erythema. No pallor.  Psychiatric: He has a normal mood and affect. His behavior is normal. Judgment and thought content normal.    Data Reviewed My note Dr Lovell Sheehan'  note  CT CT ABDOMEN AND PELVIS WITH CONTRAST  Technique: Multidetector CT imaging of the abdomen and pelvis was  performed following the standard protocol during bolus  administration of intravenous contrast.  Contrast: OMNIPAQUE IOHEXOL 300 MG/ML SOLN  Comparison: 08/03/2008.  Findings: Lung bases show no acute findings. Heart size within  normal limits. No pericardial or pleural effusion. Coronary  artery calcification.  Liver is diffusely decreased in attenuation. Gallbladder, adrenal  glands, kidneys, spleen, pancreas, stomach and proximal small bowel  are unremarkable.  There is a small periumbilical hernia containing a loop of  unobstructed small bowel. Small bowel and colon are otherwise  unremarkable. No pathologically enlarged lymph nodes. No free  fluid. No worrisome lytic or sclerotic lesions.  IMPRESSION:  1. Small periumbilical hernia contains a loop of unobstructed  small bowel.  2. Hepatic steatosis.  3. Coronary artery calcification.  Assessment    Ventral incisional hernia Obesity HTN     Plan    I congratulated the patient on his smoking cessation. I also congratulated him on his several pound weight loss. I encouraged him to keep up the good work. We reviewed his CT scan. The defect appears to be about 3 cm long by about 5 cm wide.  We rediscussed surgical options including both open and laparoscopic approach. We discussed the pros and cons of each. We also talked about typical postoperative recovery course. We also discussed the risk and benefits of surgery again.  The patient has elected to proceed with a laparoscopic repair of ventral incisional hernia repair with mesh. He would like to do the first week of May. I think that is reasonable. We rediscussed the signs and symptoms of incarceration and strangulation and what he should do should that occur. I told him to contact the office immediately if he started having more frequent episodes of  periumbilical discomfort because we may have to move his surgery up.  All of his questions were asked and answered. In the interim he was encouraged to continue with ongoing weight loss.  Mary Sella. Andrey Campanile, MD, FACS General, Bariatric, & Minimally Invasive Surgery [facility], [place]        [facility] M 07/24/2012, 12:01 PM

## 2012-07-24 NOTE — Patient Instructions (Signed)
Call if symptoms become more frequent and/or worse Continue with the great job of smoking cessation!!

## 2012-09-04 ENCOUNTER — Encounter (HOSPITAL_COMMUNITY): Payer: Self-pay | Admitting: Pharmacy Technician

## 2012-09-10 ENCOUNTER — Other Ambulatory Visit (HOSPITAL_COMMUNITY): Payer: Self-pay | Admitting: *Deleted

## 2012-09-10 NOTE — Patient Instructions (Addendum)
BALIN VANDEGRIFT  09/10/2012                           YOUR PROCEDURE IS SCHEDULED ON:  09/18/12               PLEASE REPORT TO SHORT STAY CENTER AT :  7:30 AM               CALL THIS NUMBER IF ANY PROBLEMS THE DAY OF SURGERY :               [phone]                      REMEMBER:   Do not eat food or drink liquids AFTER MIDNIGHT   Take these medicines the morning of surgery with A SIP OF WATER: NONE   Do not wear jewelry, make-up   Do not wear lotions, powders, or perfumes.   Do not shave legs or underarms 12 hrs. before surgery (men may shave face)  Do not bring valuables to the hospital.  Contacts, dentures or bridgework may not be worn into surgery.  Leave suitcase in the car. After surgery it may be brought to your room.  For patients admitted to the hospital more than one night, checkout time is 11:00                          The day of discharge.   Patients discharged the day of surgery will not be allowed to drive home                             If going home same day of surgery, must have someone stay with you first                           24 hrs at home and arrange for some one to drive you home from hospital.    Special Instructions:   Please read over the following fact sheets that you were given:               1. MRSA  INFORMATION                      2. [place] PREPARING FOR SURGERY SHEET               3. STOP ASPIRIN 5 DAYS PREOP                                                X_____________________________________________________________________        Failure to follow these instructions may result in cancellation of your surgery

## 2012-09-11 ENCOUNTER — Ambulatory Visit (HOSPITAL_COMMUNITY)
Admission: RE | Admit: 2012-09-11 | Discharge: 2012-09-11 | Disposition: A | Discharge status: Home / Self Care | Payer: BC Managed Care – PPO | Source: Ambulatory Visit | Attending: General Surgery | Admitting: General Surgery

## 2012-09-11 ENCOUNTER — Encounter (HOSPITAL_COMMUNITY): Payer: Self-pay

## 2012-09-11 ENCOUNTER — Encounter (HOSPITAL_COMMUNITY)
Admission: RE | Admit: 2012-09-11 | Discharge: 2012-09-11 | Disposition: A | Discharge status: Home / Self Care | Payer: BC Managed Care – PPO | Source: Ambulatory Visit | Attending: General Surgery | Admitting: General Surgery

## 2012-09-11 DIAGNOSIS — Z01810 Encounter for preprocedural cardiovascular examination: Secondary | ICD-10-CM | POA: Insufficient documentation

## 2012-09-11 DIAGNOSIS — Z01818 Encounter for other preprocedural examination: Secondary | ICD-10-CM | POA: Insufficient documentation

## 2012-09-11 DIAGNOSIS — Z01812 Encounter for preprocedural laboratory examination: Secondary | ICD-10-CM | POA: Insufficient documentation

## 2012-09-11 DIAGNOSIS — I10 Essential (primary) hypertension: Secondary | ICD-10-CM | POA: Insufficient documentation

## 2012-09-11 DIAGNOSIS — I771 Stricture of artery: Secondary | ICD-10-CM | POA: Insufficient documentation

## 2012-09-11 DIAGNOSIS — K439 Ventral hernia without obstruction or gangrene: Secondary | ICD-10-CM | POA: Insufficient documentation

## 2012-09-11 DIAGNOSIS — R9431 Abnormal electrocardiogram [ECG] [EKG]: Secondary | ICD-10-CM | POA: Insufficient documentation

## 2012-09-11 HISTORY — DX: Other specified postprocedural states: R11.2

## 2012-09-11 HISTORY — DX: Other specified postprocedural states: Z98.890

## 2012-09-11 HISTORY — DX: Personal history of urinary calculi: Z87.442

## 2012-09-11 LAB — CBC WITH DIFFERENTIAL/PLATELET
Basophils Relative: 0 % (ref 0–1)
Eosinophils Absolute: 0.2 10*3/uL (ref 0.0–0.7)
HCT: 43.8 % (ref 39.0–52.0)
Hemoglobin: 15.9 g/dL (ref 13.0–17.0)
Lymphs Abs: 1.7 10*3/uL (ref 0.7–4.0)
MCH: 34.1 pg — ABNORMAL HIGH (ref 26.0–34.0)
MCHC: 36.3 g/dL — ABNORMAL HIGH (ref 30.0–36.0)
Monocytes Absolute: 0.7 10*3/uL (ref 0.1–1.0)
Monocytes Relative: 11 % (ref 3–12)
Neutrophils Relative %: 58 % (ref 43–77)
RBC: 4.66 MIL/uL (ref 4.22–5.81)

## 2012-09-11 LAB — COMPREHENSIVE METABOLIC PANEL
Albumin: 3.8 g/dL (ref 3.5–5.2)
Alkaline Phosphatase: 64 U/L (ref 39–117)
BUN: 14 mg/dL (ref 6–23)
Chloride: 102 mEq/L (ref 96–112)
Creatinine, Ser: 0.74 mg/dL (ref 0.50–1.35)
GFR calc Af Amer: >90 mL/min (ref >90)
Glucose, Bld: 121 mg/dL — ABNORMAL HIGH (ref 70–99)
Potassium: 4 mEq/L (ref 3.5–5.1)
Total Bilirubin: 0.5 mg/dL (ref 0.3–1.2)
Total Protein: 7.1 g/dL (ref 6.0–8.3)

## 2012-09-11 NOTE — Progress Notes (Signed)
09/11/12 1320  OBSTRUCTIVE SLEEP APNEA  Have you ever been diagnosed with sleep apnea through a sleep study? No  Do you snore loudly (loud enough to be heard through closed doors)?  0  Do you often feel tired, fatigued, or sleepy during the daytime? 0  Has anyone observed you stop breathing during your sleep? 0  Do you have, or are you being treated for high blood pressure? 1  BMI more than 35 kg/m2? 1  Age over 54 years old? 1  Neck circumference greater than 40 cm/18 inches? 0  Gender: 1  Obstructive Sleep Apnea Score 4  Score 4 or greater  Results sent to PCP

## 2012-09-18 ENCOUNTER — Encounter (HOSPITAL_COMMUNITY): Payer: Self-pay | Admitting: Anesthesiology

## 2012-09-18 ENCOUNTER — Encounter (HOSPITAL_COMMUNITY)
Admission: RE | Disposition: A | Discharge status: Home / Self Care | Payer: Self-pay | Source: Ambulatory Visit | Attending: General Surgery

## 2012-09-18 ENCOUNTER — Encounter (HOSPITAL_COMMUNITY): Payer: Self-pay | Admitting: General Practice

## 2012-09-18 ENCOUNTER — Ambulatory Visit (HOSPITAL_COMMUNITY): Payer: BC Managed Care – PPO | Admitting: Anesthesiology

## 2012-09-18 ENCOUNTER — Observation Stay (HOSPITAL_COMMUNITY)
Admission: RE | Admit: 2012-09-18 | Discharge: 2012-09-20 | Disposition: A | Discharge status: Home / Self Care | Payer: BC Managed Care – PPO | Source: Ambulatory Visit | Attending: General Surgery | Admitting: General Surgery

## 2012-09-18 DIAGNOSIS — K432 Incisional hernia without obstruction or gangrene: Principal | ICD-10-CM | POA: Insufficient documentation

## 2012-09-18 DIAGNOSIS — I10 Essential (primary) hypertension: Secondary | ICD-10-CM | POA: Insufficient documentation

## 2012-09-18 HISTORY — PX: HERNIA REPAIR: SHX51

## 2012-09-18 HISTORY — PX: VENTRAL HERNIA REPAIR: SHX424

## 2012-09-18 SURGERY — REPAIR, HERNIA, VENTRAL, LAPAROSCOPIC
Anesthesia: General | Site: Abdomen | Wound class: Clean

## 2012-09-18 MED ORDER — PROMETHAZINE HCL 25 MG/ML IJ SOLN
6.2500 mg | INTRAMUSCULAR | Status: DC | PRN
Start: 2012-09-18 — End: 2012-09-18

## 2012-09-18 MED ORDER — ACETAMINOPHEN 10 MG/ML IV SOLN
INTRAVENOUS | Status: AC
  Filled 2012-09-18: qty 100

## 2012-09-18 MED ORDER — PROPOFOL 10 MG/ML IV BOLUS
INTRAVENOUS | Status: DC | PRN
  Administered 2012-09-18: 200 mg via INTRAVENOUS

## 2012-09-18 MED ORDER — HYDROMORPHONE HCL PF 1 MG/ML IJ SOLN
INTRAMUSCULAR | Status: DC | PRN
  Administered 2012-09-18 (×4): 0.5 mg via INTRAVENOUS

## 2012-09-18 MED ORDER — DEXAMETHASONE SODIUM PHOSPHATE 10 MG/ML IJ SOLN
INTRAMUSCULAR | Status: DC | PRN
  Administered 2012-09-18: 4 mg via INTRAVENOUS

## 2012-09-18 MED ORDER — CHLORHEXIDINE GLUCONATE 4 % EX LIQD
1.0000 | Freq: Once | CUTANEOUS | Status: DC
Start: 2012-09-18 — End: 2012-09-18
  Filled 2012-09-18: qty 15

## 2012-09-18 MED ORDER — NEOSTIGMINE METHYLSULFATE 1 MG/ML IJ SOLN
INTRAMUSCULAR | Status: DC | PRN
  Administered 2012-09-18: 4 mg via INTRAVENOUS

## 2012-09-18 MED ORDER — CEFAZOLIN SODIUM 1-5 GM-% IV SOLN
INTRAVENOUS | Status: AC
  Filled 2012-09-18: qty 50

## 2012-09-18 MED ORDER — LIDOCAINE HCL (PF) 2 % IJ SOLN
INTRAMUSCULAR | Status: DC | PRN
  Administered 2012-09-18: 75 mg

## 2012-09-18 MED ORDER — HYDROMORPHONE HCL PF 1 MG/ML IJ SOLN
INTRAMUSCULAR | Status: AC
  Filled 2012-09-18: qty 1

## 2012-09-18 MED ORDER — LACTATED RINGERS IV SOLN: INTRAVENOUS | Status: DC

## 2012-09-18 MED ORDER — FENTANYL CITRATE 0.05 MG/ML IJ SOLN
INTRAMUSCULAR | Status: DC | PRN
  Administered 2012-09-18: 100 ug via INTRAVENOUS
  Administered 2012-09-18: 150 ug via INTRAVENOUS

## 2012-09-18 MED ORDER — MEPERIDINE HCL 50 MG/ML IJ SOLN: 6.2500 mg | INTRAMUSCULAR | Status: DC | PRN

## 2012-09-18 MED ORDER — ACETAMINOPHEN 10 MG/ML IV SOLN
1000.0000 mg | Freq: Four times a day (QID) | INTRAVENOUS | Status: AC
  Administered 2012-09-18 – 2012-09-19 (×4): 1000 mg via INTRAVENOUS
  Filled 2012-09-18 (×6): qty 100

## 2012-09-18 MED ORDER — ACETAMINOPHEN 10 MG/ML IV SOLN
INTRAVENOUS | Status: DC | PRN
  Administered 2012-09-18: 1000 mg via INTRAVENOUS

## 2012-09-18 MED ORDER — ONDANSETRON HCL 4 MG/2ML IJ SOLN: 4.0000 mg | Freq: Four times a day (QID) | INTRAMUSCULAR | Status: DC | PRN

## 2012-09-18 MED ORDER — HYDROMORPHONE HCL PF 1 MG/ML IJ SOLN
1.0000 mg | INTRAMUSCULAR | Status: DC | PRN
  Administered 2012-09-18 – 2012-09-19 (×6): 2 mg via INTRAVENOUS
  Filled 2012-09-18 (×6): qty 2

## 2012-09-18 MED ORDER — BUPIVACAINE HCL (PF) 0.25 % IJ SOLN
INTRAMUSCULAR | Status: AC
  Filled 2012-09-18: qty 30

## 2012-09-18 MED ORDER — KCL IN DEXTROSE-NACL 20-5-0.45 MEQ/L-%-% IV SOLN
INTRAVENOUS | Status: DC
  Administered 2012-09-18 – 2012-09-19 (×2): via INTRAVENOUS
  Administered 2012-09-19: 75 mL via INTRAVENOUS
  Filled 2012-09-18 (×5): qty 1000

## 2012-09-18 MED ORDER — MIDAZOLAM HCL 5 MG/5ML IJ SOLN
INTRAMUSCULAR | Status: DC | PRN
  Administered 2012-09-18: 2 mg via INTRAVENOUS

## 2012-09-18 MED ORDER — IRBESARTAN 150 MG PO TABS
150.0000 mg | ORAL_TABLET | Freq: Every day | ORAL | Status: DC
  Administered 2012-09-18 – 2012-09-20 (×3): 150 mg via ORAL
  Filled 2012-09-18 (×3): qty 1

## 2012-09-18 MED ORDER — FENTANYL CITRATE 0.05 MG/ML IJ SOLN
25.0000 ug | INTRAMUSCULAR | Status: DC | PRN
  Administered 2012-09-18 (×2): 50 ug via INTRAVENOUS

## 2012-09-18 MED ORDER — 0.9 % SODIUM CHLORIDE (POUR BTL) OPTIME
TOPICAL | Status: DC | PRN
  Administered 2012-09-18: 1000 mL

## 2012-09-18 MED ORDER — BUPIVACAINE-EPINEPHRINE PF 0.25-1:200000 % IJ SOLN
INTRAMUSCULAR | Status: AC
  Filled 2012-09-18: qty 30

## 2012-09-18 MED ORDER — CEFAZOLIN SODIUM-DEXTROSE 2-3 GM-% IV SOLR
INTRAVENOUS | Status: AC
  Filled 2012-09-18: qty 50

## 2012-09-18 MED ORDER — HYDROMORPHONE HCL PF 1 MG/ML IJ SOLN
0.2500 mg | INTRAMUSCULAR | Status: DC | PRN
  Administered 2012-09-18 (×4): 0.5 mg via INTRAVENOUS

## 2012-09-18 MED ORDER — ONDANSETRON HCL 4 MG PO TABS: 4.0000 mg | ORAL_TABLET | Freq: Four times a day (QID) | ORAL | Status: DC | PRN

## 2012-09-18 MED ORDER — FENTANYL CITRATE 0.05 MG/ML IJ SOLN
INTRAMUSCULAR | Status: AC
  Filled 2012-09-18: qty 2

## 2012-09-18 MED ORDER — PANTOPRAZOLE SODIUM 40 MG IV SOLR
40.0000 mg | Freq: Every day | INTRAVENOUS | Status: DC
  Administered 2012-09-18 – 2012-09-19 (×2): 40 mg via INTRAVENOUS
  Filled 2012-09-18 (×3): qty 40

## 2012-09-18 MED ORDER — LACTATED RINGERS IV SOLN
INTRAVENOUS | Status: DC | PRN
  Administered 2012-09-18: 09:00:00 via INTRAVENOUS

## 2012-09-18 MED ORDER — ROCURONIUM BROMIDE 100 MG/10ML IV SOLN
INTRAVENOUS | Status: DC | PRN
  Administered 2012-09-18: 10 mg via INTRAVENOUS
  Administered 2012-09-18: 20 mg via INTRAVENOUS
  Administered 2012-09-18: 60 mg via INTRAVENOUS
  Administered 2012-09-18: 10 mg via INTRAVENOUS

## 2012-09-18 MED ORDER — DEXTROSE 5 % IV SOLN
3.0000 g | INTRAVENOUS | Status: AC
  Administered 2012-09-18: 3 g via INTRAVENOUS
  Filled 2012-09-18: qty 3000

## 2012-09-18 MED ORDER — GLYCOPYRROLATE 0.2 MG/ML IJ SOLN
INTRAMUSCULAR | Status: DC | PRN
  Administered 2012-09-18: 0.6 mg via INTRAVENOUS

## 2012-09-18 MED ORDER — BUPIVACAINE-EPINEPHRINE 0.25% -1:200000 IJ SOLN
INTRAMUSCULAR | Status: DC | PRN
  Administered 2012-09-18: 24 mL

## 2012-09-18 MED ORDER — OXYCODONE HCL 5 MG PO TABS
5.0000 mg | ORAL_TABLET | ORAL | Status: DC | PRN
  Administered 2012-09-19 – 2012-09-20 (×4): 10 mg via ORAL
  Filled 2012-09-18 (×5): qty 2

## 2012-09-18 MED ORDER — ENOXAPARIN SODIUM 40 MG/0.4ML ~~LOC~~ SOLN
40.0000 mg | SUBCUTANEOUS | Status: DC
  Administered 2012-09-19 – 2012-09-20 (×2): 40 mg via SUBCUTANEOUS
  Filled 2012-09-18 (×3): qty 0.4

## 2012-09-18 MED ORDER — CHLORHEXIDINE GLUCONATE 4 % EX LIQD
1.0000 "application " | Freq: Once | CUTANEOUS | Status: DC
  Filled 2012-09-18: qty 15

## 2012-09-18 MED ORDER — ONDANSETRON HCL 4 MG/2ML IJ SOLN
INTRAMUSCULAR | Status: DC | PRN
  Administered 2012-09-18: 4 mg via INTRAVENOUS

## 2012-09-18 SURGICAL SUPPLY — 58 items
ADH SKN CLS APL DERMABOND .7 (GAUZE/BANDAGES/DRESSINGS) ×2
APL SKNCLS STERI-STRIP NONHPOA (GAUZE/BANDAGES/DRESSINGS)
APPLIER CLIP LOGIC TI 5 (MISCELLANEOUS) IMPLANT
APR CLP MED LRG 33X5 (MISCELLANEOUS)
BANDAGE ADHESIVE 1X3 (GAUZE/BANDAGES/DRESSINGS) IMPLANT
BENZOIN TINCTURE PRP APPL 2/3 (GAUZE/BANDAGES/DRESSINGS) IMPLANT
BINDER ABD UNIV 12 45-62 (WOUND CARE) ×1 IMPLANT
BINDER ABDOMINAL 46IN 62IN (WOUND CARE) ×3
CABLE HIGH FREQUENCY MONO STRZ (ELECTRODE) IMPLANT
CANISTER SUCTION 2500CC (MISCELLANEOUS) ×3 IMPLANT
CHLORAPREP W/TINT 26ML (MISCELLANEOUS) ×3 IMPLANT
CLOTH BEACON ORANGE TIMEOUT ST (SAFETY) ×3 IMPLANT
DECANTER SPIKE VIAL GLASS SM (MISCELLANEOUS) ×3 IMPLANT
DERMABOND ADVANCED (GAUZE/BANDAGES/DRESSINGS) ×1
DERMABOND ADVANCED .7 DNX12 (GAUZE/BANDAGES/DRESSINGS) ×1 IMPLANT
DEVICE SECURE STRAP 25 ABSORB (INSTRUMENTS) ×4 IMPLANT
DEVICE TROCAR PUNCTURE CLOSURE (ENDOMECHANICALS) ×3 IMPLANT
DRAPE INCISE IOBAN 66X45 STRL (DRAPES) IMPLANT
DRAPE LAPAROSCOPIC ABDOMINAL (DRAPES) ×3 IMPLANT
DRAPE UTILITY XL STRL (DRAPES) ×3 IMPLANT
ELECT REM PT RETURN 9FT ADLT (ELECTROSURGICAL) ×3
ELECTRODE REM PT RTRN 9FT ADLT (ELECTROSURGICAL) ×2 IMPLANT
GLOVE BIO SURGEON STRL SZ7.5 (GLOVE) ×3 IMPLANT
GLOVE BIOGEL M STRL SZ7.5 (GLOVE) IMPLANT
GLOVE BIOGEL PI IND STRL 7.0 (GLOVE) ×2 IMPLANT
GLOVE BIOGEL PI INDICATOR 7.0 (GLOVE) ×1
GLOVE INDICATOR 8.0 STRL GRN (GLOVE) ×3 IMPLANT
GOWN STRL NON-REIN LRG LVL3 (GOWN DISPOSABLE) ×3 IMPLANT
GOWN STRL REIN XL XLG (GOWN DISPOSABLE) ×6 IMPLANT
KIT BASIN OR (CUSTOM PROCEDURE TRAY) ×3 IMPLANT
MARKER SKIN DUAL TIP RULER LAB (MISCELLANEOUS) ×3 IMPLANT
MESH VENTRALIGHT ST 6X8 (Mesh Specialty) ×3 IMPLANT
MESH VENTRLGHT ELLIPSE 8X6XMFL (Mesh Specialty) ×1 IMPLANT
NDL INSUFFLATION 14GA 120MM (NEEDLE) IMPLANT
NDL SPNL 22GX3.5 QUINCKE BK (NEEDLE) ×1 IMPLANT
NEEDLE INSUFFLATION 14GA 120MM (NEEDLE) IMPLANT
NEEDLE SPNL 22GX3.5 QUINCKE BK (NEEDLE) ×3 IMPLANT
NS IRRIG 1000ML POUR BTL (IV SOLUTION) ×3 IMPLANT
SCALPEL HARMONIC ACE (MISCELLANEOUS) IMPLANT
SCISSORS LAP 5X35 DISP (ENDOMECHANICALS) IMPLANT
SET IRRIG TUBING LAPAROSCOPIC (IRRIGATION / IRRIGATOR) IMPLANT
SOLUTION ANTI FOG 6CC (MISCELLANEOUS) ×3 IMPLANT
STAPLER VISISTAT 35W (STAPLE) IMPLANT
STRIP CLOSURE SKIN 1/2X4 (GAUZE/BANDAGES/DRESSINGS) IMPLANT
SUT MNCRL AB 4-0 PS2 18 (SUTURE) ×3 IMPLANT
SUT NOVA NAB DX-16 0-1 5-0 T12 (SUTURE) IMPLANT
SUT NOVA NAB GS-21 0 18 T12 DT (SUTURE) ×4 IMPLANT
SUT PROLENE 1 CT 1 30 (SUTURE) IMPLANT
SUT PROLENE 2 0 CT 1 CR (SUTURE) IMPLANT
SUT PROLENE 2 0 SH DA (SUTURE) IMPLANT
TACKER 5MM HERNIA 3.5CML NAB (ENDOMECHANICALS) IMPLANT
TOWEL OR 17X26 10 PK STRL BLUE (TOWEL DISPOSABLE) ×3 IMPLANT
TRAY FOLEY CATH 14FRSI W/METER (CATHETERS) ×3 IMPLANT
TRAY LAP CHOLE (CUSTOM PROCEDURE TRAY) ×3 IMPLANT
TROCAR BLADELESS OPT 5 75 (ENDOMECHANICALS) ×3 IMPLANT
TROCAR XCEL BLUNT TIP 100MML (ENDOMECHANICALS) IMPLANT
TROCAR XCEL NON-BLD 11X100MML (ENDOMECHANICALS) ×3 IMPLANT
TUBING INSUFFLATION 10FT LAP (TUBING) ×3 IMPLANT

## 2012-09-18 NOTE — Transfer of Care (Signed)
Immediate Anesthesia Transfer of Care Note  Patient: [name] [name]  Procedure(s) Performed: Procedure(s): LAPAROSCOPIC VENTRAL INCISIONAL  HERNIA WITH MESH (N/A)  Patient Location: PACU  Anesthesia Type:General  Level of Consciousness: awake, alert , oriented and patient cooperative  Airway & Oxygen Therapy: Patient Spontanous Breathing and Patient connected to face mask oxygen  Post-op Assessment: Report given to PACU RN, Post -op Vital signs reviewed and stable and Patient moving all extremities X 4  Post vital signs: Reviewed and stable  Complications: No apparent anesthesia complications

## 2012-09-18 NOTE — Progress Notes (Signed)
Dr. Roxanna Mew made aware of patient's blood pressures.

## 2012-09-18 NOTE — Anesthesia Preprocedure Evaluation (Signed)
Anesthesia Evaluation  Patient identified by MRN, date of birth, ID band Patient awake    Reviewed: Allergy & Precautions, H&P , NPO status , Patient's Chart, lab work & pertinent test results  History of Anesthesia Complications (+) PONV  Airway Mallampati: II TM Distance: >3 FB Neck ROM: Full    Dental no notable dental hx.    Pulmonary neg pulmonary ROS,  breath sounds clear to auscultation  Pulmonary exam normal       Cardiovascular hypertension, Pt. on medications Rhythm:Regular Rate:Normal     Neuro/Psych negative neurological ROS  negative psych ROS   GI/Hepatic negative GI ROS, Neg liver ROS,   Endo/Other  negative endocrine ROS  Renal/GU negative Renal ROS  negative genitourinary   Musculoskeletal negative musculoskeletal ROS (+)   Abdominal   Peds negative pediatric ROS (+)  Hematology negative hematology ROS (+)   Anesthesia Other Findings   Reproductive/Obstetrics negative OB ROS                           Anesthesia Physical Anesthesia Plan  ASA: II  Anesthesia Plan: General   Post-op Pain Management:    Induction: Intravenous  Airway Management Planned: Oral ETT  Additional Equipment:   Intra-op Plan:   Post-operative Plan: Extubation in OR  Informed Consent: I have reviewed the patients History and Physical, chart, labs and discussed the procedure including the risks, benefits and alternatives for the proposed anesthesia with the patient or authorized representative who has indicated his/her understanding and acceptance.   Dental advisory given  Plan Discussed with: CRNA  Anesthesia Plan Comments:         Anesthesia Quick Evaluation

## 2012-09-18 NOTE — Anesthesia Postprocedure Evaluation (Signed)
  Anesthesia Post-op Note  Patient: [name] [name]  Procedure(s) Performed: Procedure(s) (LRB): LAPAROSCOPIC VENTRAL INCISIONAL  HERNIA WITH MESH (N/A)  Patient Location: PACU  Anesthesia Type: General  Level of Consciousness: awake and alert   Airway and Oxygen Therapy: Patient Spontanous Breathing  Post-op Pain: mild  Post-op Assessment: Post-op Vital signs reviewed, Patient's Cardiovascular Status Stable, Respiratory Function Stable, Patent Airway and No signs of Nausea or vomiting  Last Vitals:  Filed Vitals:   09/18/12 1220  BP: 174/101  Pulse:   Temp:   Resp:     Post-op Vital Signs: stable   Complications: No apparent anesthesia complications

## 2012-09-18 NOTE — H&P (Signed)
[name] [name] is an 52 y.o. male.   Chief Complaint: here for surgery HPI: 52 year old Caucasian male comes in to discuss his CT scan results and to followup on his blood pressure. I initially saw him on 06/15/2012. At that time we discovered he had a incisional ventral hernia around his umbilicus. He was also found to be very hypertensive with a blood pressure 160s over 100s. He has been started on oral antihypertensive medication by his primary care physician. He reports compliance with this medication. We also performed a CT scan of his abdomen and pelvis further delineate his abdominal wall anatomy. He reports he had one mild episode of discomfort around his umbilicus this past Monday. He describes it as a flareup. He states that he felt little bit nauseous that morning. He reports daily bowel movements. He denies any melena or hematochezia. He denies any vomiting. He has stopped smoking. He reports a several pound weight loss.  Denies any new changes  Past Medical History  Diagnosis Date  . Diverticulitis   . Hypertension   . PONV (postoperative nausea and vomiting)   . History of kidney stones     Past Surgical History  Procedure Laterality Date  . Colectomy  09/2008    Dr Johna Sheriff    Family History  Problem Relation Age of Onset  . Heart disease Other    Social History:  reports that he has never smoked. He does not have any smokeless tobacco history on file. He reports that  drinks alcohol. He reports that he does not use illicit drugs.  Allergies:  Allergies  Allergen Reactions  . Morphine And Related Itching    Medications Prior to Admission  Medication Sig Dispense Refill  . acetaminophen (TYLENOL) 500 MG tablet Take 500 mg by mouth every 6 (six) hours as needed for pain.      Marland Kitchen olmesartan (BENICAR) 20 MG tablet Take 20 mg by mouth at bedtime.      Marland Kitchen aspirin 325 MG tablet Take 325 mg by mouth every 6 (six) hours as needed for pain.        No results found for this or  any previous visit (from the past 48 hour(s)). No results found.  Review of Systems  Constitutional: Negative for fever and chills.  HENT: Negative for hearing loss.   Respiratory: Negative for shortness of breath.   Cardiovascular: Negative for chest pain, palpitations, leg swelling and PND.  Gastrointestinal: Negative for nausea, vomiting and diarrhea.  Genitourinary: Negative for dysuria and urgency.  Neurological: Negative for seizures, loss of consciousness and weakness.  Psychiatric/Behavioral: Negative for substance abuse.  All other systems reviewed and are negative.    Blood pressure 143/95, pulse 86, temperature 97.9 F (36.6 C), temperature source Oral, resp. rate 18, SpO2 98.00%. Physical Exam  Vitals reviewed. Constitutional: He is oriented to person, place, and time. He appears well-developed and well-nourished. No distress.  HENT:  Head: Normocephalic and atraumatic.  Right Ear: External ear normal.  Left Ear: External ear normal.  Eyes: Conjunctivae are normal. No scleral icterus.  Neck: Normal range of motion. No tracheal deviation present.  Cardiovascular: Normal rate.   Respiratory: Effort normal. No respiratory distress.  GI: Soft. He exhibits no distension. There is no tenderness. There is no rebound.  Musculoskeletal: He exhibits no edema.  Neurological: He is alert and oriented to person, place, and time.  Skin: Skin is warm and dry. No rash noted. He is not diaphoretic. No erythema.  Psychiatric: He  has a normal mood and affect. His behavior is normal. Judgment and thought content normal.     Assessment/Plan Obesity Ventral incisional hernia HTN  To OR for lap ventral hernia repair All questions asked and answered.  Mary Sella. Andrey Campanile, MD, FACS General, Bariatric, & Minimally Invasive Surgery [facility], [place]   [facility] M 09/18/2012, 10:28 AM

## 2012-09-18 NOTE — Op Note (Signed)
Laparoscopic Ventral Incisional Hernia Repair with mesh Procedure Note  Indications: Symptomatic ventral incisional hernia  Pre-operative Diagnosis: ventral incisional hernia  Post-operative Diagnosis: same  Surgeon: Atilano Ina   Assistants: none  Anesthesia: General endotracheal anesthesia  ASA Class: 2  Procedure Details  The patient was seen in the Holding Room. The risks, benefits, complications, treatment options, and expected outcomes were discussed with the patient. The possibilities of reaction to medication, pulmonary aspiration, perforation of viscus, bleeding, recurrent infection, the need for additional procedures, failure to diagnose a condition, and creating a complication requiring transfusion or operation were discussed with the patient. The patient concurred with the proposed plan, giving informed consent.  The site of surgery properly noted/marked. The patient was taken to the operating room, identified as [name] [name] and the procedure verified as laparoscopic ventral hernia repair with mesh. A Time Out was held and the above information confirmed.  IV antibiotic was administered.   The patient was placed supine.  After establishing general anesthesia, a Foley catheter was placed.  The abdomen was prepped with Chloraprep and draped in standard fashion along with Ioban.  A 5 mm Optiview was used the cannulate the peritoneal cavity in the left upper quadrant below the costal margin.  Pneumoperitoneum was obtained by insufflating CO2, maintaining a maximum pressure of 15 mmHg.  The 5 mm 30-degree laparoscopic was inserted.  There were no significant omental adhesions to the anterior abdominal wall in and around the hernia defect.  An 11-mm port was placed in the left anterior axillary line at the level of the umbilicus.   We inspected the entire abdominal wall and were able to visualize a swiss cheese  Defect at the umbilicus. We used a spinal needle to identify the extent of  the hernia defects.  This covered an area of about 4.5 x 5.5 cms.  We selected a 15 x 20 cm piece of Bard VentralightST mesh.  We placed 8 stay sutures of 0 Novofil around the edges of the mesh.  The mesh was then rolled up and inserted through the 11 mm port site.  The mesh was then unrolled.  The stay sutures were then pulled up through small stab incisions using the Endo-close device.  This deployed the mesh widely over the fascial defects.  The stay sutures were then tied down.  The Secure Strap device was then used to tack down the edges of the mesh at 1 cm intervals circumferentially.  We placed a few tacks inside the outer ring of tacks.  We inspected for hemostasis.    Pneumoperitoneum was then released as we removed the remainder of the trocars.  The port sites were closed with 4-0 Monocryl.  All of the incisions and stay suture sites were then sealed with Dermabond.  An abdominal binder was placed around the patient's abdomen.  The patient was extubated and brought to the recovery room in stable condition.  All sponge, instrument, and needle counts were correct prior to closure and at the conclusion of the case.   Findings: Type of repair - mesh  (choices - primary suture, mesh, or component)  Name of mesh - Bard VentralighST  Size of mesh - Length 20 cm, Width 15 cm  Mesh overlap - 5 cm  Placement of mesh - beneath fascia and into peritoneal cavity  (choices - beneath fascia and into peritoneal cavity, beneath fascia but external to peritoneal cavity, between the muscle and fascia, above or external to fascia)  Estimated Blood  Loss:  Minimal         Complications:  None; patient tolerated the procedure well.         Disposition: PACU - hemodynamically stable.         Condition: stable  [name] [name]. [name] Campanile, MD, FACS General, Bariatric, & Minimally Invasive Surgery [facility], [place]

## 2012-09-19 LAB — CBC
HCT: 40.5 % (ref 39.0–52.0)
Hemoglobin: 14 g/dL (ref 13.0–17.0)
MCH: 32.9 pg (ref 26.0–34.0)
MCHC: 34.6 g/dL (ref 30.0–36.0)

## 2012-09-19 LAB — BASIC METABOLIC PANEL
BUN: 10 mg/dL (ref 6–23)
GFR calc non Af Amer: >90 mL/min (ref >90)
Glucose, Bld: 106 mg/dL — ABNORMAL HIGH (ref 70–99)
Potassium: 4.2 mEq/L (ref 3.5–5.1)

## 2012-09-19 MED ORDER — DIPHENHYDRAMINE HCL 50 MG/ML IJ SOLN: 25.0000 mg | Freq: Every evening | INTRAMUSCULAR | Status: DC | PRN

## 2012-09-19 MED ORDER — DIPHENHYDRAMINE HCL 25 MG PO CAPS: 25.0000 mg | ORAL_CAPSULE | Freq: Every evening | ORAL | Status: DC | PRN

## 2012-09-19 NOTE — Progress Notes (Signed)
Patient ID: [name] [name], male   DOB: 1960-07-23, 52 y.o.   MRN: [id number] [facility] Surgery Progress Note:   1 Day Post-Op  Subjective: Mental status is clear.  Soreness limiting mobility Objective: Vital signs in last 24 hours: Temp:  [97.8 F (36.6 C)-99.3 F (37.4 C)] 97.8 F (36.6 C) (05/10 0550) Pulse Rate:  [58-95] 79 (05/10 0550) Resp:  [12-22] 20 (05/10 0550) BP: (123-192)/(72-125) 130/81 mmHg (05/10 0550) SpO2:  [93 %-100 %] 97 % (05/10 0550) Weight:  [266 lb 6 oz (120.827 kg)] 266 lb 6 oz (120.827 kg) (05/09 1430)  Intake/Output from previous day: 05/09 0701 - 05/10 0700 In: 3191.3 [P.O.:240; I.V.:2851.3; IV Piggyback:100] Out: 1225 [Urine:1225] Intake/Output this shift:    Physical Exam: Work of breathing is normal.  Anticipated degree of pain.  Wearing abdominal binder.    Lab Results:  Results for orders placed during the hospital encounter of 09/18/12 (from the past 48 hour(s))  BASIC METABOLIC PANEL     Status: Abnormal   Collection Time    09/19/12  4:35 AM      Result Value Range   Sodium 135  135 - 145 mEq/L   Potassium 4.2  3.5 - 5.1 mEq/L   Chloride 103  96 - 112 mEq/L   CO2 25  19 - 32 mEq/L   Glucose, Bld 106 (*) 70 - 99 mg/dL   BUN 10  6 - 23 mg/dL   Creatinine, Ser 4.09  0.50 - 1.35 mg/dL   Calcium 8.3 (*) 8.4 - 10.5 mg/dL   GFR calc non Af Amer >90  >90 mL/min   GFR calc Af Amer >90  >90 mL/min   Comment:            The eGFR has been calculated     using the CKD EPI equation.     This calculation has not been     validated in all clinical     situations.     eGFR's persistently     <90 mL/min signify     possible Chronic Kidney Disease.  CBC     Status: Abnormal   Collection Time    09/19/12  4:35 AM      Result Value Range   WBC 11.6 (*) 4.0 - 10.5 K/uL   RBC 4.26  4.22 - 5.81 MIL/uL   Hemoglobin 14.0  13.0 - 17.0 g/dL   HCT 81.1  91.4 - 78.2 %   MCV 95.1  78.0 - 100.0 fL   MCH 32.9  26.0 - 34.0 pg   MCHC 34.6  30.0 - 36.0  g/dL   RDW 95.6  21.3 - 08.6 %   Platelets 220  150 - 400 K/uL    Radiology/Results: No results found.  Anti-infectives: Anti-infectives   Start     Dose/Rate Route Frequency Ordered Stop   09/18/12 0800  ceFAZolin (ANCEF) 3 g in dextrose 5 % 50 mL IVPB     3 g 160 mL/hr over 30 Minutes Intravenous On call to O.R. 09/18/12 0732 09/18/12 1045      Assessment/Plan: Problem List: Patient Active Problem List   Diagnosis Date Noted  . Incisional ventral hernia 06/15/2012  . HTN (hypertension) 05/09/2012  . DIVERTICULITIS OF COLON 06/07/2008  . ABDOMINAL PAIN, LEFT LOWER QUADRANT 06/07/2008  . ACUTE SINUSITIS, UNSPECIFIED 11/10/2007  . NEPHROLITHIASIS, HX OF 03/09/2007  . SPRAIN/STRAIN, ANKLE NOS 01/05/2007    Doing well postop. Anticipate discharge in 1-2 days.  1 Day  Post-Op    LOS: 1 day   Matt B. Daphine Deutscher, MD, [facility] [phone] beeper [phone]  09/19/2012 7:43 AM

## 2012-09-19 NOTE — Progress Notes (Signed)
Utilization Review Completed.   Ashawnti Tangen, RN, BSN Nurse Case Manager  [phone]  

## 2012-09-20 MED ORDER — OXYCODONE HCL 5 MG PO TABS: 5.0000 mg | ORAL_TABLET | ORAL | Status: DC | PRN

## 2012-09-20 NOTE — Progress Notes (Signed)
Patient discharged to home no hhc needs present.Rhonda Davis,RN,BSN,CCM

## 2012-09-20 NOTE — Progress Notes (Signed)
Assessment unchanged. Pt verbalized understanding of dc instructions through teach back. Scripts x 1 given as provided by MD. Introduced to My Chart. Pt understanding when to follow up with surgeon in office. Discharged via wc to front entrance to meet awaiting vehicle to carry home. Accompanied by nurse and friend.

## 2012-09-20 NOTE — Discharge Summary (Signed)
Physician Discharge Summary  Patient ID: [name] [name] MRN: [id number] DOB/AGE: November 05, 1960 52 y.o.  Admit date: 09/18/2012 Discharge date: 09/20/2012  Admission Diagnoses:  Ventral hernia  Discharge Diagnoses:  same  Active Problems:   * No active hospital problems. *   Surgery:  Lap ventral hernia repair  Discharged Condition: good  Hospital Course:   Had surgery.  Observed for pain control and return of bowel function.  Doing well and ready for discharge home on Sunday-Mother's Day   Consults: none  Significant Diagnostic Studies: none    Discharge Exam: Blood pressure 149/93, pulse 70, temperature 98.1 F (36.7 C), temperature source Oral, resp. rate 16, height 5' 9.75" (1.772 m), weight 266 lb 6 oz (120.827 kg), SpO2 96.00%. Incisions are small and bland except for bruise in the left lower quadrant.  No evidence of infection  Disposition:   Discharge Orders   Future Appointments Provider Department Dept Phone   10/07/2012 11:45 AM Atilano Ina, MD [facility], [place] [phone]   Future Orders Complete By Expires     Call MD for:  persistant nausea and vomiting  As directed     Call MD for:  redness, tenderness, or signs of infection (pain, swelling, redness, odor or green/yellow discharge around incision site)  As directed     Diet - low sodium heart healthy  As directed     Driving Restrictions  As directed     Comments:      Don't drive when taking narcotic pain meds    Increase activity slowly  As directed     No wound care  As directed         Medication List    TAKE these medications       acetaminophen 500 MG tablet  Commonly known as:  TYLENOL  Take 500 mg by mouth every 6 (six) hours as needed for pain.     aspirin 325 MG tablet  Take 325 mg by mouth every 6 (six) hours as needed for pain.     olmesartan 20 MG tablet  Commonly known as:  BENICAR  Take 20 mg by mouth at bedtime.     oxyCODONE 5 MG immediate release tablet  Commonly  known as:  Oxy IR/ROXICODONE  Take 1-2 tablets (5-10 mg total) by mouth every 4 (four) hours as needed.           Follow-up Information   Follow up with Atilano Ina, MD.   Contact information:   [address] [place] [address] [phone]       Signed: Valarie Merino 09/20/2012, 7:44 AM

## 2012-09-21 ENCOUNTER — Encounter (HOSPITAL_COMMUNITY): Payer: Self-pay | Admitting: General Surgery

## 2012-10-07 ENCOUNTER — Encounter (INDEPENDENT_AMBULATORY_CARE_PROVIDER_SITE_OTHER): Payer: Self-pay | Admitting: General Surgery

## 2012-10-07 ENCOUNTER — Ambulatory Visit (INDEPENDENT_AMBULATORY_CARE_PROVIDER_SITE_OTHER): Payer: BC Managed Care – PPO | Admitting: General Surgery

## 2012-10-07 VITALS — BP 142/82 | HR 103 | Temp 96.4°F | Ht 70.0 in | Wt 261.2 lb

## 2012-10-07 DIAGNOSIS — Z09 Encounter for follow-up examination after completed treatment for conditions other than malignant neoplasm: Secondary | ICD-10-CM

## 2012-10-07 NOTE — Progress Notes (Signed)
Subjective:     Patient ID: [name] [name], male   DOB: 1961/02/18, 52 y.o.   MRN: [id number]  HPI  52 year old Caucasian male comes in for followup after undergoing a laparoscopic repair of an incisional ventral-umbilical hernia with mesh on the night. He was discharged from the hospital May 11. He states that he is doing well. He is no longer taking any pain medication. He reports bowel movements which are regular. He denies any fever chills. He did have some point tenderness in one to 2 spots on left and on the right of his abdominal wall last week. These areas of tenderness have resolved. He reports a good appetite. Review of Systems     Objective:   Physical Exam BP 142/82  Pulse 103  Temp(Src) 96.4 F (35.8 C) (Temporal)  Ht 5\' 10"  (1.778 m)  Wt 261 lb 3.2 oz (118.48 kg)  BMI 37.48 kg/m2  SpO2 98% Alert, no apparent distress Abdomen-soft, nontender, nondistended. Well-healed trocar incisions. No evidence of seroma around his umbilicus. Incisions are clean dry and intact. No hernia recurrence    Assessment:     Status post laparoscopic repair of incisional umbilical hernia with mesh     Plan:     Overall he is doing quite well. I told him he can resume light cardio activity. However he was reminded that he should not do any heavy lifting, pushing, pulling for another 4 and half weeks. Followup 8 weeks  Mary Sella. Andrey Campanile, MD, FACS General, Bariatric, & Minimally Invasive Surgery [facility] Surgery, [place]

## 2012-10-07 NOTE — Patient Instructions (Signed)
Wear your abdominal binder Avoid heavy lifting for another 4 weeks (6 weeks from 5/9)

## 2012-11-20 ENCOUNTER — Encounter (INDEPENDENT_AMBULATORY_CARE_PROVIDER_SITE_OTHER): Payer: BC Managed Care – PPO | Admitting: General Surgery

## 2012-11-25 ENCOUNTER — Encounter (INDEPENDENT_AMBULATORY_CARE_PROVIDER_SITE_OTHER): Payer: Self-pay | Admitting: General Surgery

## 2012-11-25 ENCOUNTER — Ambulatory Visit (INDEPENDENT_AMBULATORY_CARE_PROVIDER_SITE_OTHER): Payer: BC Managed Care – PPO | Admitting: General Surgery

## 2012-11-25 VITALS — BP 164/98 | HR 82 | Resp 16 | Ht 70.0 in | Wt 264.2 lb

## 2012-11-25 DIAGNOSIS — Z09 Encounter for follow-up examination after completed treatment for conditions other than malignant neoplasm: Secondary | ICD-10-CM

## 2012-11-25 NOTE — Patient Instructions (Signed)
Call with any questions

## 2012-11-25 NOTE — Progress Notes (Signed)
Subjective:     Patient ID: [name] [name], male   DOB: [birth year], 52 y.o.   MRN: [id number]  HPI  52 year old Caucasian male comes in for long term followup after undergoing a laparoscopic repair of an incisional ventral-umbilical hernia with mesh on the night. He was discharged from the hospital May 11. He states that he is doing well. He is no longer taking any pain medication. He reports bowel movements which are regular. He denies any fever chills. He has no abdomina pain.  These areas of tenderness have resolved. He reports a good appetite. He has returned to work.   Review of Systems     Objective:   Physical Exam BP 164/98  Pulse 82  Resp 16  Ht 5\' 10"  (1.778 m)  Wt 264 lb 3.2 oz (119.84 kg)  BMI 37.91 kg/m2 Alert, no apparent distress Abdomen-soft, nontender, nondistended. Well-healed trocar incisions. No evidence of seroma around his umbilicus. Incisions are well healed. No hernia recurrence    Assessment:     Status post laparoscopic repair of incisional umbilical hernia with mesh     Plan:     Overall he is doing quite well. He has already resumed full activities. We discussed his BP. It has been elevated during his last 2 appointments. I instructed him to discuss it with his PCP. We also discussed the importance of diet and exercise. F/u PRN  Mary Sella. Andrey Campanile, MD, FACS General, Bariatric, & Minimally Invasive Surgery [facility], [place]

## 2012-12-16 ENCOUNTER — Other Ambulatory Visit: Payer: Self-pay | Admitting: *Deleted

## 2012-12-16 ENCOUNTER — Ambulatory Visit (INDEPENDENT_AMBULATORY_CARE_PROVIDER_SITE_OTHER)
Admission: RE | Admit: 2012-12-16 | Discharge: 2012-12-16 | Disposition: A | Discharge status: Home / Self Care | Payer: BC Managed Care – PPO | Source: Ambulatory Visit | Attending: Internal Medicine | Admitting: Internal Medicine

## 2012-12-16 DIAGNOSIS — M722 Plantar fascial fibromatosis: Secondary | ICD-10-CM

## 2013-01-07 ENCOUNTER — Other Ambulatory Visit: Payer: Self-pay

## 2013-01-07 MED ORDER — OLMESARTAN MEDOXOMIL 20 MG PO TABS: 20.0000 mg | ORAL_TABLET | Freq: Every day | ORAL | Status: DC

## 2013-01-07 NOTE — Telephone Encounter (Signed)
Pharmacy called. Pt's insurance will only cover 90 day supply of Benicar and pt is completely out of medication.  90 supply sent to pharmacy

## 2013-01-08 ENCOUNTER — Other Ambulatory Visit: Payer: Self-pay | Admitting: *Deleted

## 2013-01-08 MED ORDER — OLMESARTAN MEDOXOMIL 20 MG PO TABS: 20.0000 mg | ORAL_TABLET | Freq: Every day | ORAL | Status: DC

## 2013-05-08 ENCOUNTER — Ambulatory Visit (INDEPENDENT_AMBULATORY_CARE_PROVIDER_SITE_OTHER): Payer: BC Managed Care – PPO | Admitting: Family Medicine

## 2013-05-08 ENCOUNTER — Encounter: Payer: Self-pay | Admitting: Family Medicine

## 2013-05-08 VITALS — BP 140/102 | Temp 97.9°F | Wt 270.0 lb

## 2013-05-08 DIAGNOSIS — J019 Acute sinusitis, unspecified: Secondary | ICD-10-CM

## 2013-05-08 MED ORDER — AZITHROMYCIN 250 MG PO TABS: ORAL_TABLET | ORAL | Status: DC

## 2013-05-08 MED ORDER — METHYLPREDNISOLONE ACETATE 80 MG/ML IJ SUSP
120.0000 mg | Freq: Once | INTRAMUSCULAR | Status: AC
  Administered 2013-05-08: 120 mg via INTRAMUSCULAR

## 2013-05-08 NOTE — Progress Notes (Signed)
   Subjective:    Patient ID: [name] [name], male    DOB: Aug 30, 1960, 52 y.o.   MRN: [id number]  HPI Here for 4 days of HA, sinus pressure, PND, and a dry cough. No fever.   Review of Systems  Constitutional: Negative.   HENT: Positive for congestion, postnasal drip and sinus pressure.   Eyes: Negative.   Respiratory: Positive for cough.        Objective:   Physical Exam  Constitutional: He appears well-developed and well-nourished. No distress.  HENT:  Right Ear: External ear normal.  Left Ear: External ear normal.  Nose: Nose normal.  Mouth/Throat: Oropharynx is clear and moist.  Eyes: Conjunctivae are normal.  Pulmonary/Chest: Effort normal and breath sounds normal.  Lymphadenopathy:    He has no cervical adenopathy.          Assessment & Plan:  Given a steroid shot. Add Mucinex bid

## 2013-05-08 NOTE — Addendum Note (Signed)
Addended by: Azucena Freed on: 05/08/2013 12:03 PM   Modules accepted: Orders

## 2013-05-08 NOTE — Progress Notes (Signed)
Pre visit review using our clinic review tool, if applicable. No additional management support is needed unless otherwise documented below in the visit note. 

## 2013-11-26 ENCOUNTER — Other Ambulatory Visit: Payer: Self-pay | Admitting: Internal Medicine

## 2013-11-26 ENCOUNTER — Other Ambulatory Visit: Payer: Self-pay

## 2013-11-26 ENCOUNTER — Ambulatory Visit (INDEPENDENT_AMBULATORY_CARE_PROVIDER_SITE_OTHER)
Admission: RE | Admit: 2013-11-26 | Discharge: 2013-11-26 | Disposition: A | Discharge status: Home / Self Care | Payer: BC Managed Care – PPO | Source: Ambulatory Visit | Attending: Internal Medicine | Admitting: Internal Medicine

## 2013-11-26 ENCOUNTER — Other Ambulatory Visit (INDEPENDENT_AMBULATORY_CARE_PROVIDER_SITE_OTHER): Payer: BC Managed Care – PPO

## 2013-11-26 DIAGNOSIS — R109 Unspecified abdominal pain: Secondary | ICD-10-CM

## 2013-11-26 DIAGNOSIS — R1084 Generalized abdominal pain: Secondary | ICD-10-CM

## 2013-11-26 LAB — BASIC METABOLIC PANEL
BUN: 17 mg/dL (ref 6–23)
CHLORIDE: 103 meq/L (ref 96–112)
CO2: 25 meq/L (ref 19–32)
Calcium: 9.5 mg/dL (ref 8.4–10.5)
Creatinine, Ser: 0.9 mg/dL (ref 0.4–1.5)
GFR: 89.58 mL/min (ref >60.00)
Glucose, Bld: 115 mg/dL — ABNORMAL HIGH (ref 70–99)
POTASSIUM: 4.2 meq/L (ref 3.5–5.1)
SODIUM: 137 meq/L (ref 135–145)

## 2013-11-26 LAB — CBC
HEMATOCRIT: 48 % (ref 39.0–52.0)
HEMOGLOBIN: 16.7 g/dL (ref 13.0–17.0)
MCHC: 34.7 g/dL (ref 30.0–36.0)
MCV: 97.6 fl (ref 78.0–100.0)
PLATELETS: 306 10*3/uL (ref 150.0–400.0)
RBC: 4.93 Mil/uL (ref 4.22–5.81)
RDW: 12.6 % (ref 11.5–15.5)
WBC: 7.2 10*3/uL (ref 4.0–10.5)

## 2013-11-26 MED ORDER — BACLOFEN 10 MG PO TABS: 10.0000 mg | ORAL_TABLET | Freq: Three times a day (TID) | ORAL | Status: DC

## 2013-11-26 MED ORDER — IOHEXOL 300 MG/ML  SOLN
100.0000 mL | Freq: Once | INTRAMUSCULAR | Status: AC | PRN
  Administered 2013-11-26: 100 mL via INTRAVENOUS

## 2013-11-27 ENCOUNTER — Ambulatory Visit (INDEPENDENT_AMBULATORY_CARE_PROVIDER_SITE_OTHER): Payer: BC Managed Care – PPO | Admitting: Family

## 2013-11-27 VITALS — BP 136/100 | HR 89 | Temp 98.1°F | Wt 258.0 lb

## 2013-11-27 DIAGNOSIS — M546 Pain in thoracic spine: Secondary | ICD-10-CM

## 2013-11-27 DIAGNOSIS — M545 Low back pain, unspecified: Secondary | ICD-10-CM

## 2013-11-27 MED ORDER — MELOXICAM 7.5 MG PO TABS: 7.5000 mg | ORAL_TABLET | Freq: Every day | ORAL | Status: DC

## 2013-11-27 NOTE — Progress Notes (Signed)
Subjective:    Patient ID: [name] [name], male    DOB: Nov 20, 1960, 53 y.o.   MRN: [id number]  HPI  [name] [name] is a 53 yr old male who presents today with chief complaint of "abdominal pain." reports that symptoms have been present for a several months. He spoke with [name] [name] yesterday and a CT abd/pelvis was ordered. CT is reviewed- notes diffuse fatty infiltration, but otherwise unremarkable. Reports mild associated nausea, but denies associated fever.  Reports pain is located in the right lower back at times and at other times is radiates around the the right side of the abdomen. Pain is worse with certain movements.  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   Review of Systems See HPI  Past Medical History  Diagnosis Date  . Diverticulitis   . Hypertension   . PONV (postoperative nausea and vomiting)   . History of kidney stones     History   Social History  . Marital Status: Single    Spouse Name: N/A    Number of Children: N/A  . Years of Education: N/A   Occupational History  . Not on file.   Social History Main Topics  . Smoking status: Never Smoker   . Smokeless tobacco: Not on file  . Alcohol Use: Yes     Comment: occasionally  . Drug Use: No  . Sexual Activity: Not on file   Other Topics Concern  . Not on file   Social History Narrative   Flight attendant  Non smoker     Past Surgical History  Procedure Laterality Date  . Colectomy  09/2008    Dr Excell Seltzer  . Ventral hernia repair N/A 09/18/2012    Procedure: LAPAROSCOPIC VENTRAL INCISIONAL  HERNIA WITH MESH;  Surgeon: Gayland Curry, MD;  Location: [facility] ORS;  Service: General;  Laterality: N/A;  . Hernia repair  09/18/12    lap incisional hernia repair    Family History  Problem Relation Age of Onset  . Heart disease Other     Allergies  Allergen Reactions  . Benadryl [Diphenhydramine Hcl] Itching    Patient stated allergy after ordered by MD  . Morphine And Related Itching    Current  Outpatient Prescriptions on File Prior to Visit  Medication Sig Dispense Refill  . olmesartan (BENICAR) 20 MG tablet Take 1 tablet (20 mg total) by mouth at bedtime.  90 tablet  3  . acetaminophen (TYLENOL) 500 MG tablet Take 500 mg by mouth every 6 (six) hours as needed for pain.      Marland Kitchen aspirin 325 MG tablet Take 325 mg by mouth every 6 (six) hours as needed for pain.      Marland Kitchen azithromycin (ZITHROMAX) 250 MG tablet As directed  6 tablet  0  . baclofen (LIORESAL) 10 MG tablet Take 1 tablet (10 mg total) by mouth 3 (three) times daily.  30 each  0   No current facility-administered medications on file prior to visit.    BP 136/100  Pulse 89  Temp(Src) 98.1 F (36.7 C) (Oral)  Wt 258 lb (117.028 kg)  SpO2 97%       Objective:   Physical Exam  Constitutional: He appears well-developed and well-nourished. No distress.  Cardiovascular: Normal rate and regular rhythm.   No murmur heard. Pulmonary/Chest: Effort normal and breath sounds normal. No respiratory distress. He has no wheezes. He has no rales. He exhibits no tenderness.  Abdominal: Soft. Bowel sounds are  normal. He exhibits no distension and no mass. There is no tenderness. There is no rebound, no guarding and no CVA tenderness.  Musculoskeletal:       Cervical back: He exhibits no tenderness.       Thoracic back: He exhibits no tenderness.       Lumbar back: He exhibits no tenderness.  Neurological:  Reflex Scores:      Patellar reflexes are 2+ on the right side and 2+ on the left side. Bilateral LE strength is 5/5          Assessment & Plan:

## 2013-11-27 NOTE — Patient Instructions (Signed)
Start meloxicam. You will be contacted about your referral to PT. Follow up with Dr. Arnoldo Morale in 1 month, sooner if problems/concerns.

## 2013-11-28 DIAGNOSIS — M546 Pain in thoracic spine: Secondary | ICD-10-CM | POA: Insufficient documentation

## 2013-11-28 NOTE — Assessment & Plan Note (Signed)
Symptoms appear most consistent with thoracic disc etiology with radicular pain radiating around right side of abdomen. Will initiate meloxicam and PT.  If symptoms worsen, or if not improved in 1 month, consider MRI of the thoracic spine.

## 2013-12-14 ENCOUNTER — Ambulatory Visit: Payer: BC Managed Care – PPO | Admitting: Physical Therapy

## 2014-02-10 ENCOUNTER — Other Ambulatory Visit: Payer: Self-pay | Admitting: Internal Medicine

## 2014-03-25 ENCOUNTER — Other Ambulatory Visit: Payer: Self-pay | Admitting: *Deleted

## 2014-03-25 MED ORDER — OLMESARTAN MEDOXOMIL 20 MG PO TABS: ORAL_TABLET | ORAL | Status: DC

## 2014-07-04 ENCOUNTER — Encounter: Payer: Self-pay | Admitting: Family Medicine

## 2014-07-04 ENCOUNTER — Ambulatory Visit (INDEPENDENT_AMBULATORY_CARE_PROVIDER_SITE_OTHER): Payer: BLUE CROSS/BLUE SHIELD | Admitting: Family Medicine

## 2014-07-04 VITALS — BP 140/90 | Temp 98.5°F | Wt 268.0 lb

## 2014-07-04 DIAGNOSIS — M8430XA Stress fracture, unspecified site, initial encounter for fracture: Secondary | ICD-10-CM | POA: Insufficient documentation

## 2014-07-04 DIAGNOSIS — I10 Essential (primary) hypertension: Secondary | ICD-10-CM

## 2014-07-04 MED ORDER — OLMESARTAN MEDOXOMIL 40 MG PO TABS
ORAL_TABLET | ORAL | Status: DC
Start: 2014-07-04 — End: 2014-10-18

## 2014-07-04 NOTE — Assessment & Plan Note (Signed)
Poor control. Increase benicar to 40mg . Consider HCTZ next visit if still poor control. My manual repeat was 142/100 and I am concerned diastolic may be hard to control with this increase.

## 2014-07-04 NOTE — Progress Notes (Signed)
  Garret Reddish, MD Phone: [phone]  Subjective:   [name] [name] is a 54 y.o. year old very pleasant male patient who presents with the following:  Hypertension-Poor control on benicar 20mg   BP Readings from Last 3 Encounters:  07/04/14 140/90  11/27/13 136/100  05/08/13 140/102  Home BP monitoring-no Compliant with medications-yes without side effects ROS-Denies any CP, HA, SOB, blurry vision, LE edema  Recently with ? Tibial stress fracture. Waking for 10 weeks with brace and crutches. [facility].  Past Medical History- Patient Active Problem List   Diagnosis Date Noted  . HTN (hypertension) 05/09/2012    Priority: Medium  . Stress fracture 07/04/2014    Priority: Low  . Right-sided thoracic back pain 11/28/2013   Medications- reviewed and updated Current Outpatient Prescriptions  Medication Sig Dispense Refill  . aspirin 325 MG tablet Take 325 mg by mouth every 6 (six) hours as needed for pain.    Marland Kitchen olmesartan (BENICAR) 20 MG tablet TAKE 1 TABLET BY MOUTH AT BEDTIME 30 tablet 6  . acetaminophen (TYLENOL) 500 MG tablet Take 500 mg by mouth every 6 (six) hours as needed for pain.     No current facility-administered medications for this visit.    Objective: BP 140/90 mmHg  Temp(Src) 98.5 F (36.9 C)  Wt 268 lb (121.564 kg) Gen: NAD, resting comfortably in chair, obese CV: RRR no murmurs rubs or gallops Lungs: CTAB no crackles, wheeze, rhonchi Abdomen: soft/nontender/nondistended/normal bowel sounds.   Ext: no edema Skin: warm, dry, no rash Neuro: grossly normal, moves all extremities  Assessment/Plan:  HTN (hypertension) Poor control. Increase benicar to 40mg . Consider HCTZ next visit if still poor control. My manual repeat was 142/100 and I am concerned diastolic may be hard to control with this increase.    Return precautions advised. Follow up 2 weeks.   Meds ordered this encounter  Medications  . olmesartan (BENICAR) 40 MG  tablet    Sig: TAKE 1 TABLET BY MOUTH AT BEDTIME    Dispense:  30 tablet    Refill:  11

## 2014-07-04 NOTE — Patient Instructions (Signed)
Increase Benicar to 40mg   See me back in 1-2 weeks. We may be able to get through some of your history next visit and save the follow up visit depending on how the BP is doing.   For front desk-ok to schedule patient for an establish visit in June in 30 minute slot not on Monday AM.

## 2014-07-26 ENCOUNTER — Ambulatory Visit (INDEPENDENT_AMBULATORY_CARE_PROVIDER_SITE_OTHER): Payer: BLUE CROSS/BLUE SHIELD | Admitting: Family Medicine

## 2014-07-26 ENCOUNTER — Encounter: Payer: Self-pay | Admitting: Family Medicine

## 2014-07-26 VITALS — BP 108/90 | HR 106 | Temp 98.6°F | Wt 268.0 lb

## 2014-07-26 DIAGNOSIS — M8430XA Stress fracture, unspecified site, initial encounter for fracture: Secondary | ICD-10-CM

## 2014-07-26 DIAGNOSIS — K5792 Diverticulitis of intestine, part unspecified, without perforation or abscess without bleeding: Secondary | ICD-10-CM | POA: Insufficient documentation

## 2014-07-26 DIAGNOSIS — Z1211 Encounter for screening for malignant neoplasm of colon: Secondary | ICD-10-CM

## 2014-07-26 DIAGNOSIS — I10 Essential (primary) hypertension: Secondary | ICD-10-CM

## 2014-07-26 NOTE — Patient Instructions (Signed)
Cancel next year appointment, schedule CPE/physical within 6 months (I already consider patient established).   Keep an eye on blood pressure and if regularly above 140/90 then come see [place].   Hopeful bottom # will improve when you become more active again-currently just a hair high. No new medication today.

## 2014-07-26 NOTE — Progress Notes (Signed)
  Garret Reddish, MD Phone: [phone]  Subjective:   [name] [name] is a 54 y.o. year old very pleasant male patient who presents with the following:  Hypertension-improved systolic but still mild poor diastolic control  BP Readings from Last 3 Encounters:  07/26/14 108/90  07/04/14 140/90  11/27/13 136/100   Home BP monitoring-no Compliant with medications-yes without side effects on full dose benicar 40mg  ROS-Denies any CP, HA, SOB, blurry vision, LE edema, transient weakness, orthopnea, PND.   Stress fracture Pain is much improved in left leg. Asks about DEXA scan  Past Medical History- Patient Active Problem List   Diagnosis Date Noted  . HTN (hypertension) 05/09/2012    Priority: Medium  . Diverticulitis     Priority: Low  . Stress fracture 07/04/2014    Priority: Low  . Right-sided thoracic back pain 11/28/2013    Priority: Low   Medications- reviewed and updated Current Outpatient Prescriptions  Medication Sig Dispense Refill  . aspirin 325 MG tablet Take 325 mg by mouth every 6 (six) hours as needed for pain.    Marland Kitchen olmesartan (BENICAR) 40 MG tablet TAKE 1 TABLET BY MOUTH AT BEDTIME 30 tablet 11  . acetaminophen (TYLENOL) 500 MG tablet Take 500 mg by mouth every 6 (six) hours as needed for pain.     No current facility-administered medications for this visit.    Objective: BP 108/90 mmHg  Pulse 106  Temp(Src) 98.6 F (37 C)  Wt 268 lb (121.564 kg) Gen: NAD, resting comfortably, walking with 1 crutch, R knee in brace CV: RRR no murmurs rubs or gallops Lungs: CTAB no crackles, wheeze, rhonchi Abdomen: soft/nontender/nondistended/normal bowel sounds. Ext: no edema Skin: warm, dry, no rash   Assessment/Plan:  HTN (hypertension) Improved systolic control on benicar 40mg . Diastolic still mildly poor controlled. I am suspicious patient will eventually need another agent such as HCTZ but I am hesistant to add at this point given he is completely sedentary due  to his knee injury and hopeful this issues will be resolved soon and he will be cleared for activity. Once he has a chance to get back to walking, i think he will do much better.    Stress fracture DEXA reasonable as seemingly unprovoked-plan for ordering at CPE.   Return precautions advised. Consider STD testing at CPE within 6 months.   Screen for colon cancer Orders Placed This Encounter  Procedures  . Ambulatory referral to Gastroenterology    Referral Priority:  Routine    Referral Type:  Consultation    Referral Reason:  Specialty Services Required    Requested Specialty:  Gastroenterology    Number of Visits Requested:  1

## 2014-07-27 NOTE — Assessment & Plan Note (Signed)
DEXA reasonable as seemingly unprovoked-plan for ordering at CPE.

## 2014-07-27 NOTE — Assessment & Plan Note (Signed)
Improved systolic control on benicar 40mg . Diastolic still mildly poor controlled. I am suspicious patient will eventually need another agent such as HCTZ but I am hesistant to add at this point given he is completely sedentary due to his knee injury and hopeful this issues will be resolved soon and he will be cleared for activity. Once he has a chance to get back to walking, i think he will do much better.

## 2014-08-29 ENCOUNTER — Encounter: Payer: Self-pay | Admitting: Internal Medicine

## 2014-10-18 ENCOUNTER — Other Ambulatory Visit: Payer: Self-pay

## 2014-10-18 MED ORDER — OLMESARTAN MEDOXOMIL 40 MG PO TABS: ORAL_TABLET | ORAL | Status: DC

## 2014-11-15 ENCOUNTER — Telehealth: Payer: Self-pay | Admitting: Family Medicine

## 2014-11-15 NOTE — Telephone Encounter (Signed)
error 

## 2014-11-16 ENCOUNTER — Encounter: Payer: Self-pay | Admitting: Family Medicine

## 2014-11-16 ENCOUNTER — Ambulatory Visit (INDEPENDENT_AMBULATORY_CARE_PROVIDER_SITE_OTHER): Payer: BLUE CROSS/BLUE SHIELD | Admitting: Family Medicine

## 2014-11-16 VITALS — BP 138/90 | HR 112 | Temp 98.2°F | Ht 70.0 in

## 2014-11-16 DIAGNOSIS — J018 Other acute sinusitis: Secondary | ICD-10-CM | POA: Diagnosis not present

## 2014-11-16 MED ORDER — AMOXICILLIN-POT CLAVULANATE 875-125 MG PO TABS
1.0000 | ORAL_TABLET | Freq: Two times a day (BID) | ORAL | Status: DC
Start: 2014-11-16 — End: 2016-06-17

## 2014-11-16 NOTE — Progress Notes (Signed)
Pre visit review using our clinic review tool, if applicable. No additional management support is needed unless otherwise documented below in the visit note. Pt declined to weigh 

## 2014-11-16 NOTE — Progress Notes (Signed)
   Subjective:    Patient ID: [name] [name], male    DOB: [birth year], 54 y.o.   MRN: [id number]  HPI Here for 4 days of sinus pressure, PND, ST, and a dry cough. No fever. Using a [employer]. He had similar sx a month ago when he went to Urgent Care and tested negative for strep. He was diagnosed with a sinus infection and was given a Zpack. He felt better for a time but now it is back.    Review of Systems  Constitutional: Negative.   HENT: Positive for congestion, postnasal drip and sinus pressure.   Eyes: Negative.   Respiratory: Positive for cough.        Objective:   Physical Exam  Constitutional: He appears well-developed and well-nourished.  HENT:  Right Ear: External ear normal.  Left Ear: External ear normal.  Nose: Nose normal.  Mouth/Throat: Oropharynx is clear and moist.  Eyes: Conjunctivae are normal.  Neck: No thyromegaly present.  Cardiovascular: Normal rate, regular rhythm, normal heart sounds and intact distal pulses.   Pulmonary/Chest: Effort normal and breath sounds normal.  Lymphadenopathy:    He has no cervical adenopathy.          Assessment & Plan:  Partially treated sinusitis. Given Augmentin. Use Mucinex prn.

## 2015-02-21 ENCOUNTER — Other Ambulatory Visit: Payer: BLUE CROSS/BLUE SHIELD

## 2015-02-28 ENCOUNTER — Encounter: Payer: Self-pay | Admitting: Family Medicine

## 2015-03-04 ENCOUNTER — Other Ambulatory Visit: Payer: Self-pay | Admitting: Family Medicine

## 2015-06-26 ENCOUNTER — Ambulatory Visit: Payer: Self-pay | Admitting: Family Medicine

## 2015-06-27 ENCOUNTER — Telehealth: Payer: Self-pay | Admitting: Family Medicine

## 2015-06-27 NOTE — Telephone Encounter (Signed)
I have not seen this yet. i would have them fax it directly to you.

## 2015-06-27 NOTE — Telephone Encounter (Signed)
Tiara w/[facility] would like to know if the doctor received a medical questionaire on the patient to be filled out.  They have faxed it several times.  It was last faxed on 06/16/15.  Please advise on the status of the form.

## 2015-06-27 NOTE — Telephone Encounter (Signed)
Nothing came across my desk today, do you remember seeing this form?

## 2015-06-28 NOTE — Telephone Encounter (Signed)
LM on [name] [name] vm letting her know we have not received the form that she has been faxing, I asked her to re-fax it to my attn.

## 2015-08-21 ENCOUNTER — Other Ambulatory Visit: Payer: Self-pay | Admitting: Internal Medicine

## 2015-09-24 ENCOUNTER — Other Ambulatory Visit: Payer: Self-pay | Admitting: Family Medicine

## 2016-01-05 ENCOUNTER — Other Ambulatory Visit: Payer: Self-pay | Admitting: Family Medicine

## 2016-03-14 IMAGING — CT CT ABD-PELV W/ CM
2 of 5 series · 17 of 46 positions shown, 19 images · IV contrast (Omnipaque 300)
Comparison: CT 06/22/2012.

CLINICAL DATA: Right lower quadrant pain.

EXAM:
CT ABDOMEN AND PELVIS WITH CONTRAST
TECHNIQUE: Multidetector CT imaging of the abdomen and pelvis was performed
using the standard protocol following bolus administration of
intravenous contrast.
CONTRAST:  100mL OMNIPAQUE IOHEXOL 300 MG/ML  SOLN

[Series 2: abd/ pel 5mm · axial · 0.93mm/px · z∈[-554,-124]mm · 14 of 98 slices shown, 16 images]
[im 6/98  soft-tissue]
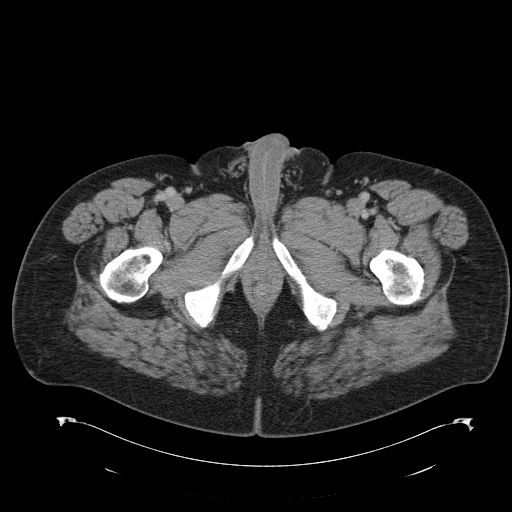
[im 6/98  bone]
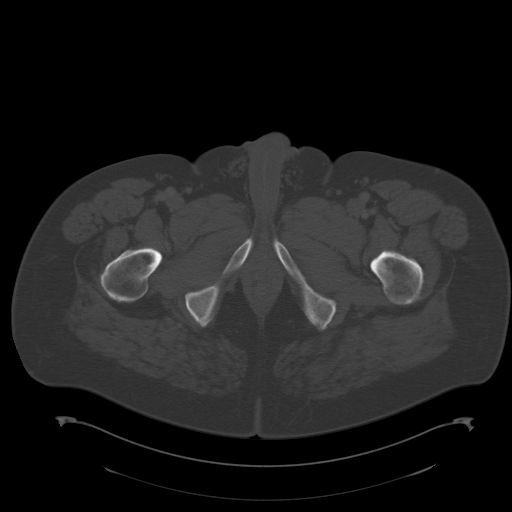
[im 11/98  soft-tissue]
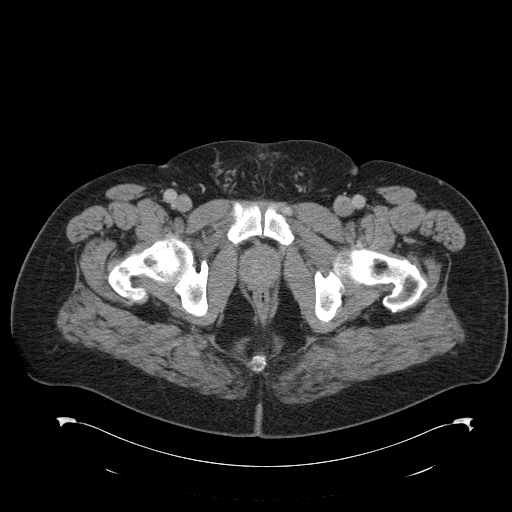
[im 21/98  soft-tissue]
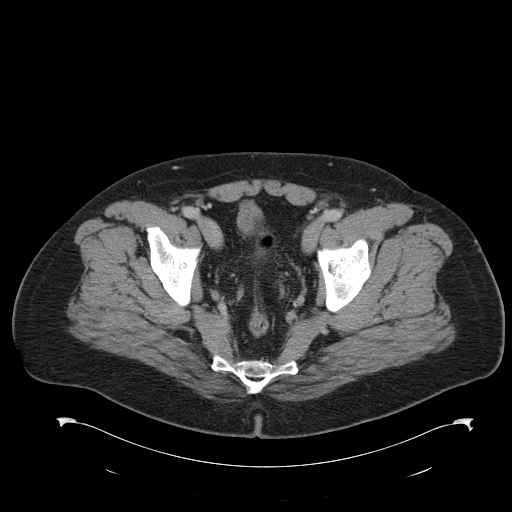
[im 26/98  soft-tissue]
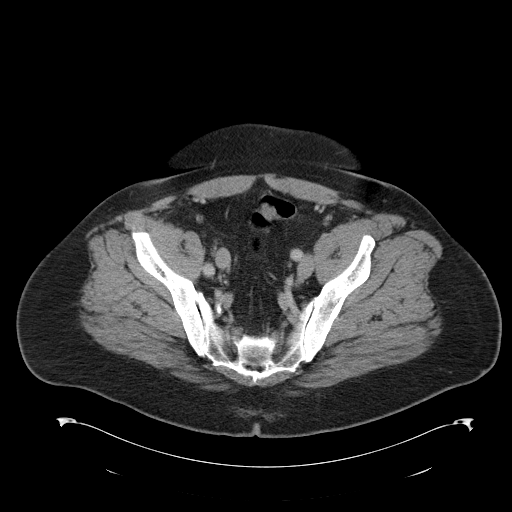
[im 31/98  soft-tissue]
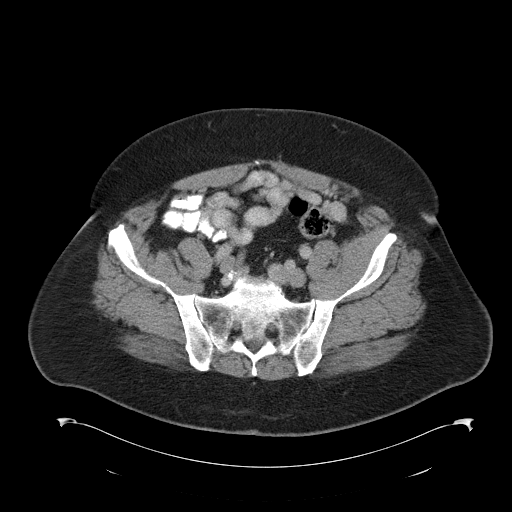
[im 41/98  soft-tissue]
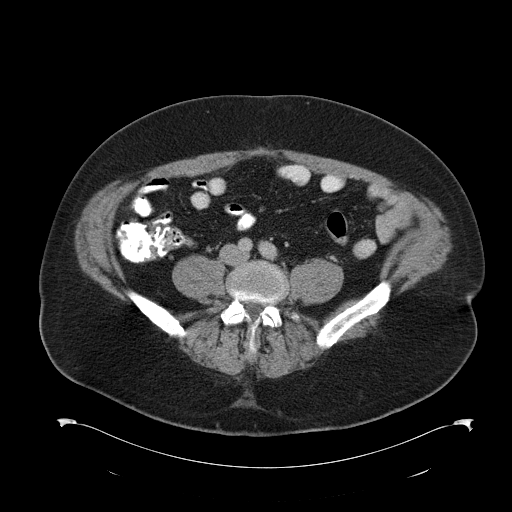
[im 46/98  soft-tissue]
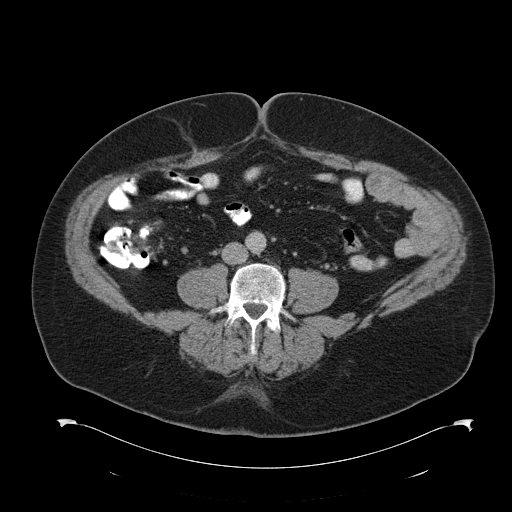
[im 52/98  soft-tissue]
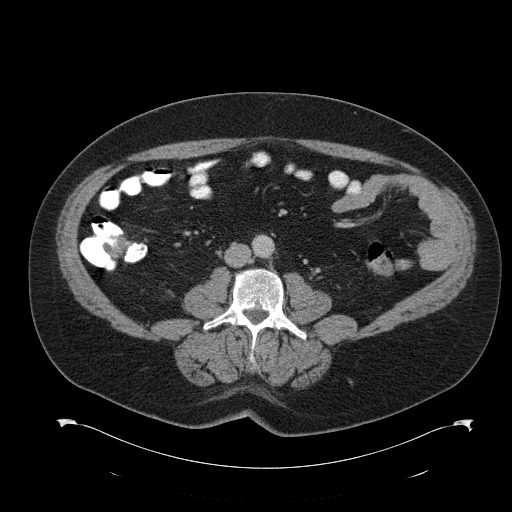
[im 57/98  soft-tissue]
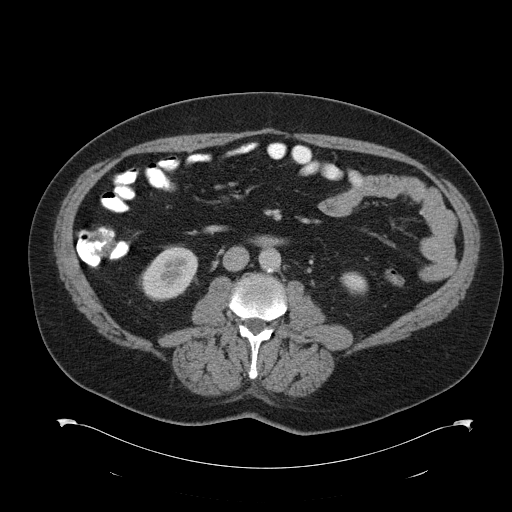
[im 57/98  bone]
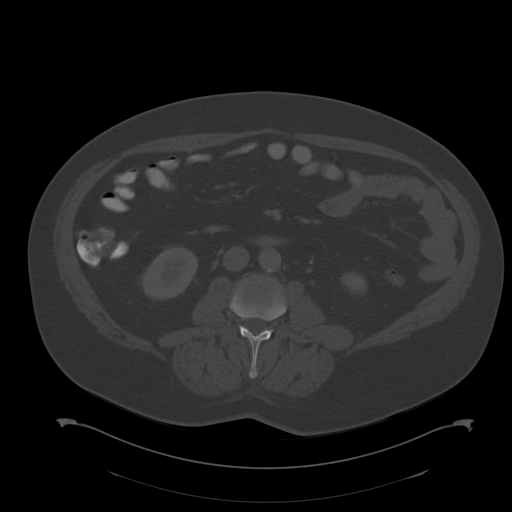
[im 67/98  soft-tissue]
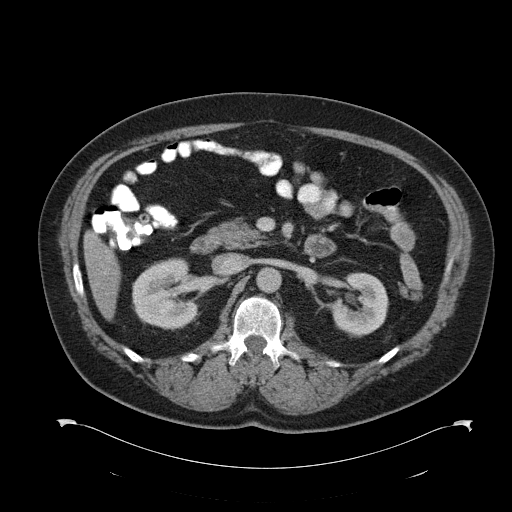
[im 72/98  soft-tissue]
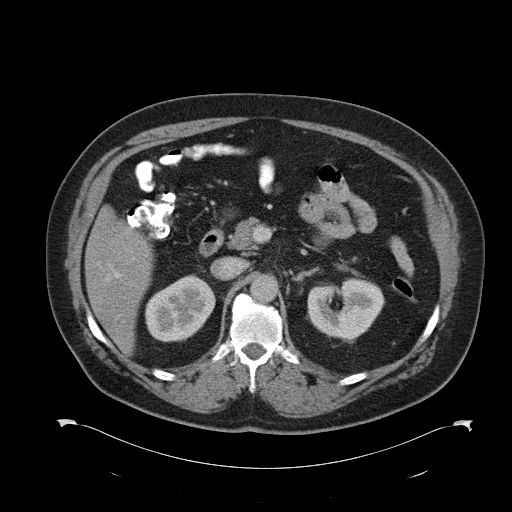
[im 77/98  soft-tissue]
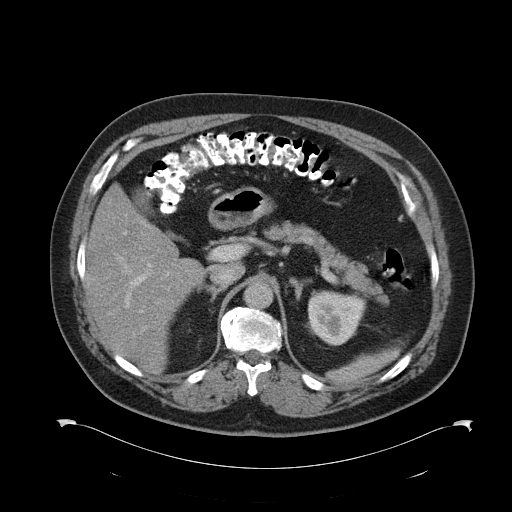
[im 87/98  soft-tissue]
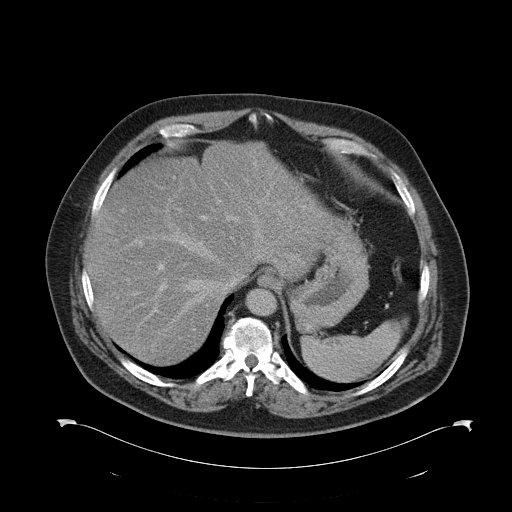
[im 92/98  soft-tissue]
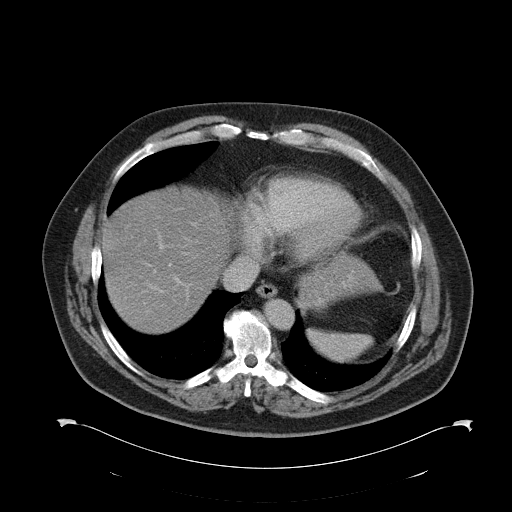

[Series 602: cor · coronal · 0.98mm/px · 3 of 149 slices shown]
[im 50/149  soft-tissue]
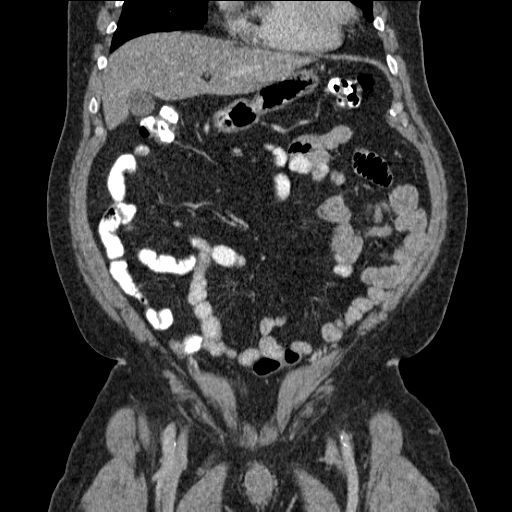
[im 66/149  soft-tissue]
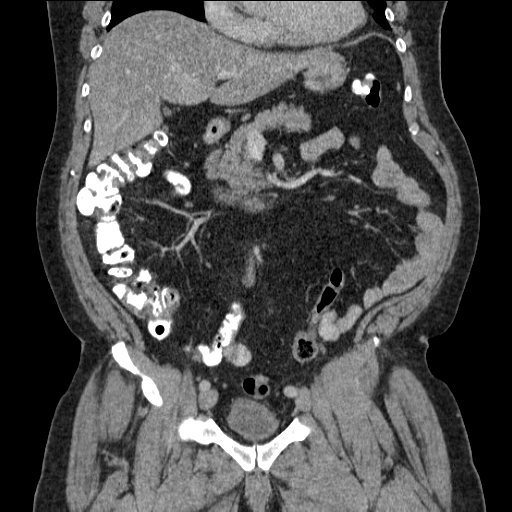
[im 83/149  soft-tissue]
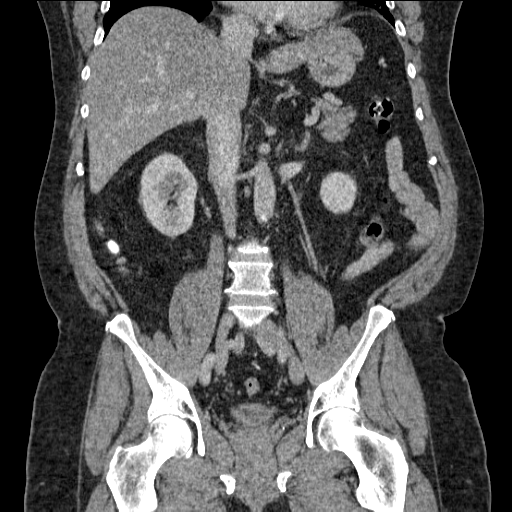

[17 of 46 positions shown; findings below may reference images not displayed]

FINDINGS: Diffuse fatty infiltration of liver is present. Spleen is normal.
Pancreas is unremarkable. No biliary distention.

Adrenals are normal. No focal renal abnormality. No evidence of
hydronephrosis or obstructing ureteral stone. The bladder is
nondistended.

No adenopathy. Abdominal aorta normal in caliber. Distal vessels are
patent. Portal veins patent.

Appendix is normal. No evidence of bowel obstruction. No free air.
Stomach is unremarkable. Esophagogastric region is normal. No
mesenteric mass. Tiny inguinal hernias with herniation of fat only.
Tiny umbilical hernia with herniation of fat only.

Lung bases clear. Coronary artery disease. Degenerative changes
lumbar spine. No acute bony abnormality.
IMPRESSION: 1.  Diffuse fatty infiltration the liver.

2.  No acute abnormality.  Appendix is unremarkable.

## 2016-05-02 ENCOUNTER — Other Ambulatory Visit: Payer: Self-pay | Admitting: Family Medicine

## 2016-05-02 NOTE — Telephone Encounter (Signed)
Called and left a voicemeail message asking for a return phone call. Patient has not been seen in a year and a half.

## 2016-06-17 ENCOUNTER — Ambulatory Visit (INDEPENDENT_AMBULATORY_CARE_PROVIDER_SITE_OTHER): Payer: BLUE CROSS/BLUE SHIELD | Admitting: Family Medicine

## 2016-06-17 ENCOUNTER — Encounter: Payer: Self-pay | Admitting: Family Medicine

## 2016-06-17 DIAGNOSIS — I10 Essential (primary) hypertension: Secondary | ICD-10-CM

## 2016-06-17 MED ORDER — OLMESARTAN MEDOXOMIL 40 MG PO TABS: ORAL_TABLET | ORAL | 3 refills | Status: DC

## 2016-06-17 NOTE — Patient Instructions (Signed)
Your blood pressure trend concerns me. I would like for you to buy/use a home cuff to check at least 4-5x a week. Your goal is <140/90. If you note in the next few weeks that it is higher than our goal, see me sooner. Otherwise, schedule next available physical that fits your schedule. Also get bloodwork a week before.     Bring your home cuff and your log of blood pressures with you to visit.   I agree with you weight loss would be great- up 10 lbs since last visit.   Regular exercise 150 minutes a week advised.   DASH Eating Plan DASH stands for "Dietary Approaches to Stop Hypertension." The DASH eating plan is a healthy eating plan that has been shown to reduce high blood pressure (hypertension). Additional health benefits may include reducing the risk of type 2 diabetes mellitus, heart disease, and stroke. The DASH eating plan may also help with weight loss. What do I need to know about the DASH eating plan? For the DASH eating plan, you will follow these general guidelines:  Choose foods with less than 150 milligrams of sodium per serving (as listed on the food label).  Use salt-free seasonings or herbs instead of table salt or sea salt.  Check with your health care provider or pharmacist before using salt substitutes.  Eat lower-sodium products. These are often labeled as "low-sodium" or "no salt added."  Eat fresh foods. Avoid eating a lot of canned foods.  Eat more vegetables, fruits, and low-fat dairy products.  Choose whole grains. Look for the word "whole" as the first word in the ingredient list.  Choose fish and skinless chicken or [place] more often than red meat. Limit fish, poultry, and meat to 6 oz (170 g) each day.  Limit sweets, desserts, sugars, and sugary drinks.  Choose heart-healthy fats.  Eat more home-cooked food and less restaurant, buffet, and fast food.  Limit fried foods.  Do not fry foods. Cook foods using methods such as baking, boiling, grilling,  and broiling instead.  When eating at a restaurant, ask that your food be prepared with less salt, or no salt if possible. What foods can I eat? Seek help from a dietitian for individual calorie needs. Grains  Whole grain or whole wheat bread. Brown rice. Whole grain or whole wheat pasta. Quinoa, bulgur, and whole grain cereals. Low-sodium cereals. Corn or whole wheat flour tortillas. Whole grain cornbread. Whole grain crackers. Low-sodium crackers. Vegetables  Fresh or frozen vegetables (raw, steamed, roasted, or grilled). Low-sodium or reduced-sodium tomato and vegetable juices. Low-sodium or reduced-sodium tomato sauce and paste. Low-sodium or reduced-sodium canned vegetables. Fruits  All fresh, canned (in natural juice), or frozen fruits. Meat and Other Protein Products  Ground beef (85% or leaner), grass-fed beef, or beef trimmed of fat. Skinless chicken or [place]. Ground chicken or [place]. Pork trimmed of fat. All fish and seafood. Eggs. Dried beans, peas, or lentils. Unsalted nuts and seeds. Unsalted canned beans. Dairy  Low-fat dairy products, such as skim or 1% milk, 2% or reduced-fat cheeses, low-fat ricotta or cottage cheese, or plain low-fat yogurt. Low-sodium or reduced-sodium cheeses. Fats and Oils  Tub margarines without trans fats. Light or reduced-fat mayonnaise and salad dressings (reduced sodium). Avocado. Safflower, olive, or canola oils. Natural peanut or almond butter. Other  Unsalted popcorn and pretzels. The items listed above may not be a complete list of recommended foods or beverages. Contact your dietitian for more options.  What foods are not  recommended? Grains  White bread. White pasta. White rice. Refined cornbread. Bagels and croissants. Crackers that contain trans fat. Vegetables  Creamed or fried vegetables. Vegetables in a cheese sauce. Regular canned vegetables. Regular canned tomato sauce and paste. Regular tomato and vegetable juices. Fruits  Canned  fruit in light or heavy syrup. Fruit juice. Meat and Other Protein Products  Fatty cuts of meat. Ribs, chicken wings, bacon, sausage, bologna, salami, chitterlings, fatback, hot dogs, bratwurst, and packaged luncheon meats. Salted nuts and seeds. Canned beans with salt. Dairy  Whole or 2% milk, cream, half-and-half, and cream cheese. Whole-fat or sweetened yogurt. Full-fat cheeses or blue cheese. Nondairy creamers and whipped toppings. Processed cheese, cheese spreads, or cheese curds. Condiments  Onion and garlic salt, seasoned salt, table salt, and sea salt. Canned and packaged gravies. Worcestershire sauce. Tartar sauce. Barbecue sauce. Teriyaki sauce. Soy sauce, including reduced sodium. Steak sauce. Fish sauce. Oyster sauce. Cocktail sauce. Horseradish. Ketchup and mustard. Meat flavorings and tenderizers. Bouillon cubes. Hot sauce. Tabasco sauce. Marinades. Taco seasonings. Relishes. Fats and Oils  Butter, stick margarine, lard, shortening, ghee, and bacon fat. Coconut, palm kernel, or palm oils. Regular salad dressings. Other  Pickles and olives. Salted popcorn and pretzels. The items listed above may not be a complete list of foods and beverages to avoid. Contact your dietitian for more information.  Where can I find more information? National Heart, Lung, and Blood Institute: travelstabloid.com This information is not intended to replace advice given to you by your health care provider. Make sure you discuss any questions you have with your health care provider. Document Released: 04/18/2011 Document Revised: 10/05/2015 Document Reviewed: 03/03/2013 Elsevier Interactive Patient Education  2017 [employer].

## 2016-06-17 NOTE — Progress Notes (Signed)
Subjective:  [name] [name] is a 56 y.o. year old very pleasant male patient who presents for/with See problem oriented charting ROS- No chest pain or shortness of breath. No headache or blurry vision. Admits to anxiety in offic   Past Medical History-  Patient Active Problem List   Diagnosis Date Noted  . HTN (hypertension) 05/09/2012    Priority: Medium  . Diverticulitis     Priority: Low  . Stress fracture 07/04/2014    Priority: Low  . Right-sided thoracic back pain 11/28/2013    Priority: Low    Medications- reviewed and updated Current Outpatient Prescriptions  Medication Sig Dispense Refill  . acetaminophen (TYLENOL) 500 MG tablet Take 500 mg by mouth every 6 (six) hours as needed for pain.    Marland Kitchen aspirin 325 MG tablet Take 325 mg by mouth every 6 (six) hours as needed for pain.    Marland Kitchen olmesartan (BENICAR) 40 MG tablet TAKE 1 TABLET AT BEDTIME 90 tablet 3   No current facility-administered medications for this visit.     Objective: BP (!) 154/96   Pulse (!) 102   Temp 98.8 F (37.1 C) (Oral)   Wt 278 lb 6.4 oz (126.3 kg)   SpO2 96%   BMI 39.95 kg/m  Gen: NAD, resting comfortably CV: RRR no murmurs rubs or gallops Lungs: CTAB no crackles, wheeze, rhonchi  Ext: no edema Skin: warm, dry Slightly sweaty- admits to anxiety  Assessment/Plan:  HTN (hypertension) S: poorly  controlled on olmesartan 40mg . Well controlled at home with Home reading at [facility] was 130/85 BP Readings from Last 3 Encounters:  06/17/16 (!) 154/96  11/16/14 138/90  07/26/14 108/90  A/P:Continue current meds:  Start home monitoring(see avs). Weight up 10 lbs- work on weight loss and do dash eating plan. CPE within 3 months   Meds ordered this encounter  Medications  . olmesartan (BENICAR) 40 MG tablet    Sig: TAKE 1 TABLET AT BEDTIME    Dispense:  90 tablet    Refill:  3   The duration of face-to-face time during this visit was greater than 15 minutes. Greater than 50% of this time was  spent in counseling, explanation of diagnosis, planning of further management, and/or coordination of care including primarily counseling on BP goals, home monitoring, potential for white coat HTN, need for weight loss/healthy eating, discussion of dash eating plan.    Return precautions advised.  Garret Reddish, MD

## 2016-06-17 NOTE — Progress Notes (Signed)
Pre visit review using our clinic review tool, if applicable. No additional management support is needed unless otherwise documented below in the visit note. 

## 2016-06-17 NOTE — Assessment & Plan Note (Signed)
S: poorly  controlled on olmesartan 40mg . Well controlled at home with Home reading at [facility] was 130/85 BP Readings from Last 3 Encounters:  06/17/16 (!) 154/96  11/16/14 138/90  07/26/14 108/90  A/P:Continue current meds:  Start home monitoring(see avs). Weight up 10 lbs- work on weight loss and do dash eating plan. CPE within 3 months

## 2016-08-27 ENCOUNTER — Other Ambulatory Visit: Payer: BLUE CROSS/BLUE SHIELD

## 2016-09-03 ENCOUNTER — Encounter: Payer: BLUE CROSS/BLUE SHIELD | Admitting: Family Medicine

## 2017-06-14 ENCOUNTER — Other Ambulatory Visit: Payer: Self-pay | Admitting: Family Medicine

## 2017-06-28 ENCOUNTER — Ambulatory Visit (INDEPENDENT_AMBULATORY_CARE_PROVIDER_SITE_OTHER): Payer: BLUE CROSS/BLUE SHIELD | Admitting: Family Medicine

## 2017-06-28 ENCOUNTER — Encounter: Payer: Self-pay | Admitting: Family Medicine

## 2017-06-28 ENCOUNTER — Other Ambulatory Visit: Payer: Self-pay

## 2017-06-28 VITALS — BP 150/90 | HR 96 | Temp 98.7°F | Resp 16 | Wt 278.0 lb

## 2017-06-28 DIAGNOSIS — H6983 Other specified disorders of Eustachian tube, bilateral: Secondary | ICD-10-CM | POA: Diagnosis not present

## 2017-06-28 DIAGNOSIS — J0100 Acute maxillary sinusitis, unspecified: Secondary | ICD-10-CM

## 2017-06-28 MED ORDER — AMOXICILLIN 500 MG PO CAPS: 500.0000 mg | ORAL_CAPSULE | Freq: Two times a day (BID) | ORAL | 0 refills | Status: AC

## 2017-06-28 MED ORDER — FLUTICASONE PROPIONATE 50 MCG/ACT NA SUSP: 1.0000 | Freq: Every day | NASAL | 0 refills | Status: DC

## 2017-06-28 NOTE — Patient Instructions (Addendum)
Remember to start using a daily allergy pill such as Claritin, Allegra, Zyrtec.  This will help with your ear symptoms.  I have also sent a prescription for an antibiotic called amoxicillin to your pharmacy to help with your sinus infection.  I have also sent Flonase and nasal spray to help with your ear pain/pressure and your sinus infection.   Eustachian Tube Dysfunction The eustachian tube connects the middle ear to the back of the nose. It regulates air pressure in the middle ear by allowing air to move between the ear and nose. It also helps to drain fluid from the middle ear space. When the eustachian tube does not function properly, air pressure, fluid, or both can build up in the middle ear. Eustachian tube dysfunction can affect one or both ears. What are the causes? This condition happens when the eustachian tube becomes blocked or cannot open normally. This may result from:  Ear infections.  Colds and other upper respiratory infections.  Allergies.  Irritation, such as from cigarette smoke or acid from the stomach coming up into the esophagus (gastroesophageal reflux).  Sudden changes in air pressure, such as from descending in an airplane.  Abnormal growths in the nose or throat, such as nasal polyps, tumors, or enlarged tissue at the back of the throat (adenoids).  What increases the risk? This condition may be more likely to develop in people who smoke and people who are overweight. Eustachian tube dysfunction may also be more likely to develop in children, especially children who have:  Certain birth defects of the mouth, such as cleft palate.  Large tonsils and adenoids.  What are the signs or symptoms? Symptoms of this condition may include:  A feeling of fullness in the ear.  Ear pain.  Clicking or popping noises in the ear.  Ringing in the ear.  Hearing loss.  Loss of balance.  Symptoms may get worse when the air pressure around you changes, such as when  you travel to an area of high elevation or fly on an airplane. How is this diagnosed? This condition may be diagnosed based on:  Your symptoms.  A physical exam of your ear, nose, and throat.  Tests, such as those that measure: ? The movement of your eardrum (tympanogram). ? Your hearing (audiometry).  How is this treated? Treatment depends on the cause and severity of your condition. If your symptoms are mild, you may be able to relieve your symptoms by moving air into ("popping") your ears. If you have symptoms of fluid in your ears, treatment may include:  Decongestants.  Antihistamines.  Nasal sprays or ear drops that contain medicines that reduce swelling (steroids).  In some cases, you may need to have a procedure to drain the fluid in your eardrum (myringotomy). In this procedure, a small tube is placed in the eardrum to:  Drain the fluid.  Restore the air in the middle ear space.  Follow these instructions at home:  Take over-the-counter and prescription medicines only as told by your health care provider.  Use techniques to help pop your ears as recommended by your health care provider. These may include: ? Chewing gum. ? Yawning. ? Frequent, forceful swallowing. ? Closing your mouth, holding your nose closed, and gently blowing as if you are trying to blow air out of your nose.  Do not do any of the following until your health care provider approves: ? Travel to high altitudes. ? Fly in airplanes. ? Work in a pressurized  cabin or room. ? Scuba dive.  Keep your ears dry. Dry your ears completely after showering or bathing.  Do not smoke.  Keep all follow-up visits as told by your health care provider. This is important. Contact a health care provider if:  Your symptoms do not go away after treatment.  Your symptoms come back after treatment.  You are unable to pop your ears.  You have: ? A fever. ? Pain in your ear. ? Pain in your head or  neck. ? Fluid draining from your ear.  Your hearing suddenly changes.  You become very dizzy.  You lose your balance. This information is not intended to replace advice given to you by your health care provider. Make sure you discuss any questions you have with your health care provider. Document Released: 05/26/2015 Document Revised: 10/05/2015 Document Reviewed: 05/18/2014 Elsevier Interactive Patient Education  2018 [employer].  Sinusitis, Adult Sinusitis is soreness and inflammation of your sinuses. Sinuses are hollow spaces in the bones around your face. Your sinuses are located:  Around your eyes.  In the middle of your forehead.  Behind your nose.  In your cheekbones.  Your sinuses and nasal passages are lined with a stringy fluid (mucus). Mucus normally drains out of your sinuses. When your nasal tissues become inflamed or swollen, the mucus can become trapped or blocked so air cannot flow through your sinuses. This allows bacteria, viruses, and funguses to grow, which leads to infection. Sinusitis can develop quickly and last for 7?10 days (acute) or for more than 12 weeks (chronic). Sinusitis often develops after a cold. What are the causes? This condition is caused by anything that creates swelling in the sinuses or stops mucus from draining, including:  Allergies.  Asthma.  Bacterial or viral infection.  Abnormally shaped bones between the nasal passages.  Nasal growths that contain mucus (nasal polyps).  Narrow sinus openings.  Pollutants, such as chemicals or irritants in the air.  A foreign object stuck in the nose.  A fungal infection. This is rare.  What increases the risk? The following factors may make you more likely to develop this condition:  Having allergies or asthma.  Having had a recent cold or respiratory tract infection.  Having structural deformities or blockages in your nose or sinuses.  Having a weak immune system.  Doing a  lot of swimming or diving.  Overusing nasal sprays.  Smoking.  What are the signs or symptoms? The main symptoms of this condition are pain and a feeling of pressure around the affected sinuses. Other symptoms include:  Upper toothache.  Earache.  Headache.  Bad breath.  Decreased sense of smell and taste.  A cough that may get worse at night.  Fatigue.  Fever.  Thick drainage from your nose. The drainage is often green and it may contain pus (purulent).  Stuffy nose or congestion.  Postnasal drip. This is when extra mucus collects in the throat or back of the nose.  Swelling and warmth over the affected sinuses.  Sore throat.  Sensitivity to light.  How is this diagnosed? This condition is diagnosed based on symptoms, a medical history, and a physical exam. To find out if your condition is acute or chronic, your health care provider may:  Look in your nose for signs of nasal polyps.  Tap over the affected sinus to check for signs of infection.  View the inside of your sinuses using an imaging device that has a light attached (endoscope).  If your health care provider suspects that you have chronic sinusitis, you may also:  Be tested for allergies.  Have a sample of mucus taken from your nose (nasal culture) and checked for bacteria.  Have a mucus sample examined to see if your sinusitis is related to an allergy.  If your sinusitis does not respond to treatment and it lasts longer than 8 weeks, you may have an MRI or CT scan to check your sinuses. These scans also help to determine how severe your infection is. In rare cases, a bone biopsy may be done to rule out more serious types of fungal sinus disease. How is this treated? Treatment for sinusitis depends on the cause and whether your condition is chronic or acute. If a virus is causing your sinusitis, your symptoms will go away on their own within 10 days. You may be given medicines to relieve your  symptoms, including:  Topical nasal decongestants. They shrink swollen nasal passages and let mucus drain from your sinuses.  Antihistamines. These drugs block inflammation that is triggered by allergies. This can help to ease swelling in your nose and sinuses.  Topical nasal corticosteroids. These are nasal sprays that ease inflammation and swelling in your nose and sinuses.  Nasal saline washes. These rinses can help to get rid of thick mucus in your nose.  If your condition is caused by bacteria, you will be given an antibiotic medicine. If your condition is caused by a fungus, you will be given an antifungal medicine. Surgery may be needed to correct underlying conditions, such as narrow nasal passages. Surgery may also be needed to remove polyps. Follow these instructions at home: Medicines  Take, use, or apply over-the-counter and prescription medicines only as told by your health care provider. These may include nasal sprays.  If you were prescribed an antibiotic medicine, take it as told by your health care provider. Do not stop taking the antibiotic even if you start to feel better. Hydrate and Humidify  Drink enough water to keep your urine clear or pale yellow. Staying hydrated will help to thin your mucus.  Use a cool mist humidifier to keep the humidity level in your home above 50%.  Inhale steam for 10-15 minutes, 3-4 times a day or as told by your health care provider. You can do this in the bathroom while a hot shower is running.  Limit your exposure to cool or dry air. Rest  Rest as much as possible.  Sleep with your head raised (elevated).  Make sure to get enough sleep each night. General instructions  Apply a warm, moist washcloth to your face 3-4 times a day or as told by your health care provider. This will help with discomfort.  Wash your hands often with soap and water to reduce your exposure to viruses and other germs. If soap and water are not  available, use hand sanitizer.  Do not smoke. Avoid being around people who are smoking (secondhand smoke).  Keep all follow-up visits as told by your health care provider. This is important. Contact a health care provider if:  You have a fever.  Your symptoms get worse.  Your symptoms do not improve within 10 days. Get help right away if:  You have a severe headache.  You have persistent vomiting.  You have pain or swelling around your face or eyes.  You have vision problems.  You develop confusion.  Your neck is stiff.  You have trouble breathing. This information is  not intended to replace advice given to you by your health care provider. Make sure you discuss any questions you have with your health care provider. Document Released: 04/29/2005 Document Revised: 12/24/2015 Document Reviewed: 02/22/2015 Elsevier Interactive Patient Education  [employer].

## 2017-06-28 NOTE — Progress Notes (Signed)
Subjective:    Patient ID: [name] [name], male    DOB: 1961-01-19, 57 y.o.   MRN: [id number]  Chief Complaint  Patient presents with  . Otalgia    HPI Patient was seen today for ongoing concern.  Patient endorses left ear pain, sinus pressure, sore throat, fullness of ear, and some dizziness x 3 wks.  He has taken Coricidin.   Pt is a flight attendant, at times feeling dizzy during take off.     Past Medical History:  Diagnosis Date  . Diverticulitis    leading to approx 1 foot resection-colectomy  . History of kidney stones   . Hypertension   . NEPHROLITHIASIS, HX OF 03/09/2007  . PONV (postoperative nausea and vomiting)   . SPRAIN/STRAIN, ANKLE NOS 01/05/2007    Allergies  Allergen Reactions  . Benadryl [Diphenhydramine Hcl] Itching    Patient stated allergy after ordered by MD  . Morphine And Related Itching    ROS General: Denies fever, chills, night sweats, changes in weight, changes in appetite  +dizziness HEENT: Denies headaches, changes in vision, rhinorrhea  +ear pain, sinus pressure, sore throat, fullness of ear CV: Denies CP, palpitations, SOB, orthopnea Pulm: Denies SOB, cough, wheezing GI: Denies abdominal pain, nausea, vomiting, diarrhea, constipation GU: Denies dysuria, hematuria, frequency, vaginal discharge Msk: Denies muscle cramps, joint pains Neuro: Denies weakness, numbness, tingling Skin: Denies rashes, bruising Psych: Denies depression, anxiety, hallucinations     Objective:    Blood pressure (!) 150/90, pulse 96, temperature 98.7 F (37.1 C), temperature source Oral, resp. rate 16, weight 278 lb (126.1 kg), SpO2 98 %.   Gen. Pleasant, well-nourished, in no distress, normal affect   HEENT: [place]/AT, face symmetric, no scleral icterus, PERRLA, nares patent without drainage, pharynx with postnasal drainage and mild erythema, no exudate. Lungs: no accessory muscle use, CTAB, no wheezes or rales Cardiovascular: RRR, no m/r/g, no peripheral  edema Abdomen: BS present, soft, NT/ND Neuro:  A&Ox3, CN II-XII intact, normal gait    Wt Readings from Last 3 Encounters:  06/28/17 278 lb (126.1 kg)  06/17/16 278 lb 6.4 oz (126.3 kg)  07/26/14 268 lb (121.6 kg)    Lab Results  Component Value Date   WBC 7.2 11/26/2013   HGB 16.7 11/26/2013   HCT 48.0 11/26/2013   PLT 306.0 11/26/2013   GLUCOSE 115 (H) 11/26/2013   ALT 28 09/11/2012   AST 24 09/11/2012   NA 137 11/26/2013   K 4.2 11/26/2013   CL 103 11/26/2013   CREATININE 0.9 11/26/2013   BUN 17 11/26/2013   CO2 25 11/26/2013   TSH 0.92 06/22/2012   INR 1.2 06/10/2008    Assessment/Plan:  Acute non-recurrent maxillary sinusitis - Plan: fluticasone (FLONASE) 50 MCG/ACT nasal spray, amoxicillin (AMOXIL) 500 MG capsule  Dysfunction of both eustachian tubes  -given handout -discussed taking a daily allergy pill such as Zyrtec, Allegra, or Claritin. - Plan: fluticasone (FLONASE) 50 MCG/ACT nasal spray  Pt advised to f/u in 1 wk for his bp.  He is currently on Olmesartan 40 mg daily, but bp is elevated 150/90.  Grier Mitts, MD

## 2017-07-08 ENCOUNTER — Encounter: Payer: Self-pay | Admitting: Physician Assistant

## 2017-07-08 ENCOUNTER — Ambulatory Visit: Payer: BLUE CROSS/BLUE SHIELD | Admitting: Physician Assistant

## 2017-07-08 VITALS — BP 160/100 | HR 102 | Temp 98.2°F

## 2017-07-08 DIAGNOSIS — J0111 Acute recurrent frontal sinusitis: Secondary | ICD-10-CM

## 2017-07-08 DIAGNOSIS — I10 Essential (primary) hypertension: Secondary | ICD-10-CM

## 2017-07-08 DIAGNOSIS — H6122 Impacted cerumen, left ear: Secondary | ICD-10-CM

## 2017-07-08 LAB — COMPREHENSIVE METABOLIC PANEL
ALT: 26 U/L (ref 0–53)
AST: 21 U/L (ref 0–37)
Albumin: 4.2 g/dL (ref 3.5–5.2)
Alkaline Phosphatase: 56 U/L (ref 39–117)
BUN: 17 mg/dL (ref 6–23)
CHLORIDE: 102 meq/L (ref 96–112)
CO2: 26 meq/L (ref 19–32)
Calcium: 9.8 mg/dL (ref 8.4–10.5)
Creatinine, Ser: 0.93 mg/dL (ref 0.40–1.50)
GFR: 88.44 mL/min (ref >60.00)
GLUCOSE: 111 mg/dL — AB (ref 70–99)
Potassium: 4.3 mEq/L (ref 3.5–5.1)
SODIUM: 136 meq/L (ref 135–145)
Total Bilirubin: 0.6 mg/dL (ref 0.2–1.2)
Total Protein: 7.2 g/dL (ref 6.0–8.3)

## 2017-07-08 LAB — CBC WITH DIFFERENTIAL/PLATELET
BASOS PCT: 0.5 % (ref 0.0–3.0)
Basophils Absolute: 0 10*3/uL (ref 0.0–0.1)
Eosinophils Absolute: 0.1 10*3/uL (ref 0.0–0.7)
Eosinophils Relative: 1.4 % (ref 0.0–5.0)
HEMATOCRIT: 47.3 % (ref 39.0–52.0)
Hemoglobin: 16.6 g/dL (ref 13.0–17.0)
LYMPHS ABS: 1.8 10*3/uL (ref 0.7–4.0)
Lymphocytes Relative: 25.9 % (ref 12.0–46.0)
MCHC: 35 g/dL (ref 30.0–36.0)
MCV: 96.8 fl (ref 78.0–100.0)
MONO ABS: 0.7 10*3/uL (ref 0.1–1.0)
Monocytes Relative: 10.2 % (ref 3.0–12.0)
NEUTROS ABS: 4.3 10*3/uL (ref 1.4–7.7)
NEUTROS PCT: 62 % (ref 43.0–77.0)
PLATELETS: 245 10*3/uL (ref 150.0–400.0)
RBC: 4.89 Mil/uL (ref 4.22–5.81)
RDW: 13.3 % (ref 11.5–15.5)
WBC: 7 10*3/uL (ref 4.0–10.5)

## 2017-07-08 MED ORDER — AMOXICILLIN-POT CLAVULANATE 875-125 MG PO TABS: 1.0000 | ORAL_TABLET | Freq: Two times a day (BID) | ORAL | 0 refills | Status: DC

## 2017-07-08 MED ORDER — AMLODIPINE BESYLATE 10 MG PO TABS: 10.0000 mg | ORAL_TABLET | Freq: Every day | ORAL | 1 refills | Status: DC

## 2017-07-08 NOTE — Assessment & Plan Note (Addendum)
Continues to be poorly controlled. EKG today without abnormal findings, compared to prior EKG in epic from 2014. BP Readings from Last 3 Encounters:  07/08/17 (!) 160/100  06/28/17 (!) 150/90  06/17/16 (!) 154/96  Reports compliance with medications. Last OV for BP > 1 year ago, was lost to f/u. Start 10 mg Norvasc. Check CMP today. No red flags on exam. Follow-up with [place] in 2 weeks. Push fluids. Work on weight loss. Provided BP log for him to complete so we can review at follow-up. Reviewed return precautions. Encouraged compliance. Suspect possible OSA as well, consider at follow-up.

## 2017-07-08 NOTE — Progress Notes (Signed)
[name] [name] is a 57 y.o. male here for a follow up of a pre-existing problem.  I acted as a Education administrator for [employer], PA-C Anselmo Pickler, LPN  History of Present Illness:   Chief Complaint  Patient presents with  . Left ear clogged    Otalgia   There is pain in the left ear. This is a recurrent problem. Episode onset: Started on Feb 1st, was seen on 2/16 for same issue. The problem has been gradually improving. There has been no fever. The pain is at a severity of 2/10. The pain is mild. Associated symptoms include coughing, headaches and neck pain. Pertinent negatives include no ear discharge. Associated symptoms comments: Pt is c/o slight sore throat and some discomfort left side neck. Left ear feels muffled.. He has tried antibiotics and NSAIDs for the symptoms. The treatment provided no relief. Pt is a swimmer, wears ears plugs   He saw Dr. Volanda Napoleon on 06/28/17 and was started on amoxicillin. He has completed this course and is still having pressure in his L ear. He is also taking the flonase as prescribed. 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 Pain is now more around his L eye with his sinuses and in his jaw.  Essential HTN Currently taking Benicar 40 mg. At home blood pressure readings are unknown -- patient does not check. Patient denies chest pain, SOB, blurred vision unusual headaches, lower leg swelling or pain. Patient is compliant with medication. Denies excessive caffeine intake, stimulant usage, excessive alcohol intake, or increase in salt consumption.  He has a recent history of weight gain, refused to be weighed today.  Past Medical History:  Diagnosis Date  . Diverticulitis    leading to approx 1 foot resection-colectomy  . History of kidney stones   . NEPHROLITHIASIS, HX OF 03/09/2007  . PONV (postoperative nausea and vomiting)   . SPRAIN/STRAIN, ANKLE NOS 01/05/2007     Social History   Socioeconomic History  . Marital status:  Single    Spouse name: Not on file  . Number of children: Not on file  . Years of education: Not on file  . Highest education level: Not on file  Social Needs  . Financial resource strain: Not on file  . Food insecurity - worry: Not on file  . Food insecurity - inability: Not on file  . Transportation needs - medical: Not on file  . Transportation needs - non-medical: Not on file  Occupational History  . Not on file  Tobacco Use  . Smoking status: Former Smoker    Types: Cigarettes    Last attempt to quit: 05/13/2017    Years since quitting: 0.1  . Smokeless tobacco: Never Used  . Tobacco comment: Doing Vape in the evenings  Substance and Sexual Activity  . Alcohol use: Yes    Alcohol/week: 0.0 oz    Comment: occasionally  . Drug use: No  . Sexual activity: No  Other Topics Concern  . Not on file  Social History Narrative   Single. Lives alone. Monogamous.    Long term friend of Dr. 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       Hobbies: working around [employer], mountain bike    Past Surgical History:  Procedure Laterality Date  . COLECTOMY  09/2008   Dr Excell Seltzer  . HERNIA REPAIR  09/18/12   lap incisional hernia repair, related to  colectomy  . VENTRAL HERNIA REPAIR N/A 09/18/2012    Family History  Problem Relation Age of Onset  . Heart attack Father        65  . CVA Mother        1    Allergies  Allergen Reactions  . Benadryl [Diphenhydramine Hcl] Itching    Patient stated allergy after ordered by MD  . Morphine And Related Itching    Current Medications:   Current Outpatient Medications:  .  fexofenadine-pseudoephedrine (ALLEGRA-D 24) 180-240 MG 24 hr tablet, Take 1 tablet by mouth daily., Disp: , Rfl:  .  acetaminophen (TYLENOL) 500 MG tablet, Take 500 mg by mouth every 6 (six) hours as needed for pain., Disp: , Rfl:  .  amLODipine (NORVASC) 10 MG tablet, Take 1 tablet (10 mg total) by mouth daily., Disp: 30  tablet, Rfl: 1 .  amoxicillin-clavulanate (AUGMENTIN) 875-125 MG tablet, Take 1 tablet by mouth 2 (two) times daily., Disp: 20 tablet, Rfl: 0 .  aspirin 325 MG tablet, Take 325 mg by mouth every 6 (six) hours as needed for pain., Disp: , Rfl:  .  fluticasone (FLONASE) 50 MCG/ACT nasal spray, Place 1 spray into both nostrils daily., Disp: 16 g, Rfl: 0 .  olmesartan (BENICAR) 40 MG tablet, TAKE 1 TABLET AT BEDTIME, Disp: 90 tablet, Rfl: 0   Review of Systems:   Review of Systems  HENT: Positive for ear pain. Negative for ear discharge.   Respiratory: Positive for cough.   Musculoskeletal: Positive for neck pain.  Neurological: Positive for headaches.    Vitals:   Vitals:   07/08/17 1320 07/08/17 1344  BP: (!) 180/110 (!) 160/100  Pulse: (!) 101 (!) 102  Temp: 98.2 F (36.8 C)   TempSrc: Oral   SpO2: 95%      There is no height or weight on file to calculate BMI.  Physical Exam:   Physical Exam  Constitutional: He appears well-developed. He is cooperative.  Non-toxic appearance. He does not have a sickly appearance. He does not appear ill. No distress.  HENT:  Head: Normocephalic and atraumatic.  Right Ear: Tympanic membrane, external ear and ear canal normal. Tympanic membrane is not erythematous, not retracted and not bulging.  Left Ear: Tympanic membrane and external ear normal. Left ear exhibits lacerations (small area of dried blood in canal). Tympanic membrane is not erythematous, not retracted and not bulging.  Nose: Mucosal edema and rhinorrhea present. Right sinus exhibits no maxillary sinus tenderness and no frontal sinus tenderness. Left sinus exhibits maxillary sinus tenderness and frontal sinus tenderness.  Mouth/Throat: Uvula is midline and mucous membranes are normal. Posterior oropharyngeal erythema present. No posterior oropharyngeal edema.  L TM unable to be visualized prior to ear lavage; improved and able to be visualized after lavage  Eyes: Conjunctivae and  lids are normal.  Neck: Trachea normal.  Cardiovascular: Regular rhythm, S1 normal, S2 normal, normal heart sounds and intact distal pulses. Tachycardia present.  Pulmonary/Chest: Effort normal and breath sounds normal. He has no decreased breath sounds. He has no wheezes. He has no rhonchi. He has no rales.  Lymphadenopathy:    He has no cervical adenopathy.  Neurological: He is alert.  Skin: Skin is warm and intact. He is diaphoretic.  Psychiatric: He has a normal mood and affect. His speech is normal and behavior is normal.  Nursing note and vitals reviewed.  Procedure: Cerumen Disimpaction Gentle ear lavage performed on the left ear. There were no complications and  following the disimpaction the tympanic membrane was visible on the left. Tympanic membranes are intact following the procedure.  Auditory canals are inflamed.  The patient reported slight resolution of symptoms after removal of cerumen.   Assessment and Plan:    Problem List Items Addressed This Visit      Cardiovascular and Mediastinum   HTN (hypertension)    Continues to be poorly controlled. EKG today without abnormal findings, compared to prior EKG in epic from 2014. BP Readings from Last 3 Encounters:  07/08/17 (!) 160/100  06/28/17 (!) 150/90  06/17/16 (!) 154/96  Reports compliance with medications. Last OV for BP > 1 year ago, was lost to f/u. Start 10 mg Norvasc. Check CMP today. No red flags on exam. Follow-up with [place] in 2 weeks. Push fluids. Work on weight loss. Provided BP log for him to complete so we can review at follow-up. Reviewed return precautions. Encouraged compliance. Suspect possible OSA as well, consider at follow-up.      Relevant Medications   amLODipine (NORVASC) 10 MG tablet   Other Relevant Orders   EKG 12-Lead (Completed)   Comprehensive metabolic panel   CBC with Differential/Platelet    Other Visit Diagnoses    Impacted cerumen of left ear    -  Primary Slight improvement in  hearing (per patient) after procedure. Consider ENT if symptoms persist.   Relevant Orders   Ear wax removal   Acute recurrent frontal sinusitis     Failed Amoxicillin treatment. Start Augmentin. Follow-up if symptoms worsen or persist. Push fluids. Reviewed red flags and return precautions.   Relevant Medications   fexofenadine-pseudoephedrine (ALLEGRA-D 24) 180-240 MG 24 hr tablet   amoxicillin-clavulanate (AUGMENTIN) 875-125 MG tablet      . Reviewed expectations re: course of current medical issues. . Discussed self-management of symptoms. . Outlined signs and symptoms indicating need for more acute intervention. . Patient verbalized understanding and all questions were answered. . See orders for this visit as documented in the electronic medical record. . Patient received an After-Visit Summary.  CMA or LPN served as scribe during this visit. History, Physical, and Plan performed by medical provider. Documentation and orders reviewed and attested to.  Inda Coke, PA-C

## 2017-07-08 NOTE — Patient Instructions (Signed)
It was great to see you.  Follow-up in 2 weeks with Dr. Yong Channel or myself.  Keep your blood pressure log. Continue Benicar, start Norvasc and Augmentin.  If you develop chest pain or any other worrisome symptoms, please go to the ER.

## 2017-07-28 ENCOUNTER — Telehealth: Payer: Self-pay | Admitting: *Deleted

## 2017-07-28 ENCOUNTER — Ambulatory Visit: Payer: BLUE CROSS/BLUE SHIELD | Admitting: Physician Assistant

## 2017-07-28 ENCOUNTER — Encounter: Payer: Self-pay | Admitting: Physician Assistant

## 2017-07-28 VITALS — BP 140/80 | HR 98 | Temp 97.8°F | Ht 70.0 in | Wt 295.4 lb

## 2017-07-28 DIAGNOSIS — I10 Essential (primary) hypertension: Secondary | ICD-10-CM

## 2017-07-28 DIAGNOSIS — Z23 Encounter for immunization: Secondary | ICD-10-CM

## 2017-07-28 DIAGNOSIS — Z1211 Encounter for screening for malignant neoplasm of colon: Secondary | ICD-10-CM | POA: Diagnosis not present

## 2017-07-28 MED ORDER — AMLODIPINE BESYLATE 10 MG PO TABS: 10.0000 mg | ORAL_TABLET | Freq: Every day | ORAL | 0 refills | Status: DC

## 2017-07-28 NOTE — Telephone Encounter (Signed)
Copied from [place]. Topic: General - Other >> Jul 28, 2017  4:06 PM Neva Seat wrote: Pt was in this morning for office visit with Madilyn Hook.  He was told to check w/ his insurance to see what they would cover for the Shingrix vaccine and to call back.  The office wasn't answering call when I called to check. He is covered 100%. He is wanting to know what would be next to get the vaccine.

## 2017-07-28 NOTE — Progress Notes (Signed)
[name] [name] is a 57 y.o. male is here to discuss: Hypertension  I acted as a Education administrator for [employer], PA-C Anselmo Pickler, LPN   History of Present Illness:   Chief Complaint  Patient presents with  . Follow-up  . Hypertension    Hypertension  This is a chronic problem. Episode onset: Pt here today for follow up on blood pressure. Pt has not been checking his pressure.  The problem is uncontrolled. Pertinent negatives include no blurred vision, chest pain, headaches, peripheral edema or shortness of breath. Risk factors for coronary artery disease include male gender. Compliance problems include exercise and diet.    He is currently taking Benicar 40 mg and Norvasc 10 mg.  BP Readings from Last 3 Encounters:  07/28/17 140/80  07/08/17 (!) 160/100  06/28/17 (!) 150/90   He reports that he watched [employer] this morning and they were discussing colorectal awareness month, states that he would like to get a screening colonoscopy.   Health Maintenance Due  Topic Date Due  . Hepatitis C Screening  1960-07-06  . HIV Screening  09/06/1973  . COLONOSCOPY  09/06/2008    Past Medical History:  Diagnosis Date  . Diverticulitis    leading to approx 1 foot resection-colectomy  . History of kidney stones   . NEPHROLITHIASIS, HX OF 03/09/2007  . PONV (postoperative nausea and vomiting)   . SPRAIN/STRAIN, ANKLE NOS 01/05/2007     Social History   Socioeconomic History  . Marital status: Single    Spouse name: Not on file  . Number of children: Not on file  . Years of education: Not on file  . Highest education level: Not on file  Social Needs  . Financial resource strain: Not on file  . Food insecurity - worry: Not on file  . Food insecurity - inability: Not on file  . Transportation needs - medical: Not on file  . Transportation needs - non-medical: Not on file  Occupational History  . Not on file  Tobacco Use  . Smoking status: Former Smoker   Types: Cigarettes    Last attempt to quit: 05/13/2017    Years since quitting: 0.2  . Smokeless tobacco: Never Used  . Tobacco comment: Doing Vape in the evenings  Substance and Sexual Activity  . Alcohol use: Yes    Alcohol/week: 0.0 oz    Comment: occasionally  . Drug use: No  . Sexual activity: No  Other Topics Concern  . Not on file  Social History Narrative   Single. Lives alone. Monogamous.    Long term friend of Dr. 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       Hobbies: working around [employer], mountain bike    Past Surgical History:  Procedure Laterality Date  . COLECTOMY  09/2008   Dr Excell Seltzer  . HERNIA REPAIR  09/18/12   lap incisional hernia repair, related to colectomy  . VENTRAL HERNIA REPAIR N/A 09/18/2012    Family History  Problem Relation Age of Onset  . Heart attack Father        71  . CVA Mother        11    PMHx, SurgHx, SocialHx, FamHx, Medications, and Allergies were reviewed in the Visit Navigator and updated as appropriate.   Patient Active Problem List   Diagnosis Date Noted  . Diverticulitis   . Stress fracture 07/04/2014  . Right-sided thoracic  back pain 11/28/2013  . HTN (hypertension) 05/09/2012    Social History   Tobacco Use  . Smoking status: Former Smoker    Types: Cigarettes    Last attempt to quit: 05/13/2017    Years since quitting: 0.2  . Smokeless tobacco: Never Used  . Tobacco comment: Doing Vape in the evenings  Substance Use Topics  . Alcohol use: Yes    Alcohol/week: 0.0 oz    Comment: occasionally  . Drug use: No    Current Medications and Allergies:    Current Outpatient Medications:  .  acetaminophen (TYLENOL) 500 MG tablet, Take 500 mg by mouth every 6 (six) hours as needed for pain., Disp: , Rfl:  .  amLODipine (NORVASC) 10 MG tablet, Take 1 tablet (10 mg total) by mouth daily., Disp: 90 tablet, Rfl: 0 .  aspirin 325 MG tablet, Take 325 mg by mouth every 6 (six)  hours as needed for pain., Disp: , Rfl:  .  fluticasone (FLONASE) 50 MCG/ACT nasal spray, Place 1 spray into both nostrils daily., Disp: 16 g, Rfl: 0 .  olmesartan (BENICAR) 40 MG tablet, TAKE 1 TABLET AT BEDTIME, Disp: 90 tablet, Rfl: 0   Allergies  Allergen Reactions  . Benadryl [Diphenhydramine Hcl] Itching    Patient stated allergy after ordered by MD  . Morphine And Related Itching    Review of Systems   Review of Systems  Eyes: Negative for blurred vision.  Respiratory: Negative for shortness of breath.   Cardiovascular: Negative for chest pain.  Neurological: Negative for headaches.    Vitals:   Vitals:   07/28/17 1332  BP: 140/80  Pulse: 98  Temp: 97.8 F (36.6 C)  TempSrc: Oral  SpO2: 95%  Weight: 295 lb 6.4 oz (134 kg)  Height: 5\' 10"  (1.778 m)     Body mass index is 42.39 kg/m.   Physical Exam:    Physical Exam  Constitutional: He appears well-developed. He is cooperative.  Non-toxic appearance. He does not have a sickly appearance. He does not appear ill. No distress.  Cardiovascular: Normal rate, regular rhythm, S1 normal, S2 normal, normal heart sounds and normal pulses.  No LE edema  Pulmonary/Chest: Effort normal and breath sounds normal.  Neurological: He is alert. GCS eye subscore is 4. GCS verbal subscore is 5. GCS motor subscore is 6.  Skin: Skin is warm, dry and intact.  Psychiatric: He has a normal mood and affect. His speech is normal and behavior is normal.  Nursing note and vitals reviewed.    Assessment and Plan:    Problem List Items Addressed This Visit      Cardiovascular and Mediastinum   HTN (hypertension) - Primary    Improved control today. He would like to work on weight loss before we add another agent, I am in agreement. Follow-up in 3 months with Dr. Yong Channel, sooner if concerns.      Relevant Medications   amLODipine (NORVASC) 10 MG tablet    Other Visit Diagnoses    Need for prophylactic vaccination with combined  diphtheria-tetanus-pertussis (DTP) vaccine       Relevant Orders   Tdap vaccine greater than or equal to 7yo IM (Completed)   Screening for colon cancer       Relevant Orders   Ambulatory referral to Gastroenterology       . Reviewed expectations re: course of current medical issues. . Discussed self-management of symptoms. . Outlined signs and symptoms indicating need for more acute intervention. Marland Kitchen  Patient verbalized understanding and all questions were answered. . See orders for this visit as documented in the electronic medical record. . Patient received an After Visit Summary.  CMA or LPN served as scribe during this visit. History, Physical, and Plan performed by medical provider. Documentation and orders reviewed and attested to.  Inda Coke, PA-C [place], [place] 07/28/2017  Follow-up: Return in about 3 months (around 10/28/2017) for Bp follow up with Dr. Yong Channel.

## 2017-07-28 NOTE — Assessment & Plan Note (Signed)
Improved control today. He would like to work on weight loss before we add another agent, I am in agreement. Follow-up in 3 months with Dr. Yong Channel, sooner if concerns.

## 2017-07-30 ENCOUNTER — Encounter: Payer: Self-pay | Admitting: Internal Medicine

## 2017-08-05 NOTE — Telephone Encounter (Signed)
Spoke to pt, told him I got his message. We have a wait list right now for the vaccine if you like you can go tho the pharmacy to have it done. Pt said he prefers to have it here. Told him okay  I will put you on wait list and as soon as we a vaccines I will contact you for an appt. Pt verbalized understanding.

## 2017-08-26 ENCOUNTER — Ambulatory Visit (INDEPENDENT_AMBULATORY_CARE_PROVIDER_SITE_OTHER): Payer: BLUE CROSS/BLUE SHIELD

## 2017-08-26 DIAGNOSIS — Z23 Encounter for immunization: Secondary | ICD-10-CM

## 2017-09-14 ENCOUNTER — Other Ambulatory Visit: Payer: Self-pay | Admitting: Family Medicine

## 2017-09-15 ENCOUNTER — Other Ambulatory Visit: Payer: Self-pay

## 2017-09-15 MED ORDER — OLMESARTAN MEDOXOMIL 40 MG PO TABS: 40.0000 mg | ORAL_TABLET | Freq: Every day | ORAL | 0 refills | Status: DC

## 2017-09-23 ENCOUNTER — Telehealth: Payer: Self-pay | Admitting: Family Medicine

## 2017-09-23 ENCOUNTER — Other Ambulatory Visit: Payer: Self-pay

## 2017-09-23 MED ORDER — OLMESARTAN MEDOXOMIL 40 MG PO TABS: 40.0000 mg | ORAL_TABLET | Freq: Every day | ORAL | 0 refills | Status: DC

## 2017-09-23 NOTE — Telephone Encounter (Signed)
Copied from [place] [id number]. Topic: Quick Communication - See Telephone Encounter >> Sep 23, 2017 11:36 AM Bea Graff, NT wrote: CRM for notification. See Telephone encounter for: 09/23/17. Pt states that [facility] will not refill the olmesartan (BENICAR) 40 MG tablet due to the message for no refills until appt. Pt wants to know if they should be refilling the one that was sent in on May 6th? Pt is scheduled for an appointment in June. Please advise.

## 2017-09-23 NOTE — Telephone Encounter (Signed)
[facility] to see what was wrong and they said we didn't send in a prescription that it was a request for the medication. They also said that in order for him to get his  Medication quickly I should send it in to his local pharmacy. I [facility]. That will get him threw until his next appointment in June. I called patient to go over this with him but he didn't answer so I left a voicemail letting him know I sent it in.

## 2017-09-30 ENCOUNTER — Telehealth: Payer: Self-pay | Admitting: Family Medicine

## 2017-09-30 ENCOUNTER — Other Ambulatory Visit: Payer: Self-pay

## 2017-09-30 ENCOUNTER — Ambulatory Visit (AMBULATORY_SURGERY_CENTER): Payer: Self-pay | Admitting: *Deleted

## 2017-09-30 VITALS — Ht 69.75 in | Wt 289.6 lb

## 2017-09-30 DIAGNOSIS — Z1211 Encounter for screening for malignant neoplasm of colon: Secondary | ICD-10-CM

## 2017-09-30 MED ORDER — AMLODIPINE BESYLATE 10 MG PO TABS: 10.0000 mg | ORAL_TABLET | Freq: Every day | ORAL | 0 refills | Status: DC

## 2017-09-30 NOTE — Telephone Encounter (Signed)
14 day prescription sent to pharmacy for amlodipine

## 2017-09-30 NOTE — Telephone Encounter (Signed)
Copied from [place] [id number]. Topic: Quick Communication - See Telephone Encounter >> Sep 30, 2017 12:42 PM Percell Belt A wrote: CRM for notification. See Telephone encounter for: 09/30/17. Pt called in and stated that his amLODipine (NORVASC) 10 MG tablet [[id number]] is going to take 2 [facility].  He would like a small script sent to  [facility] [address]

## 2017-09-30 NOTE — Progress Notes (Signed)
No egg or soy allergy known to patient  No issues with past sedation with any surgeries  or procedures, no intubation problems  No diet pills per patient No home 02 use per patient  No blood thinners per patient  Pt denies issues with constipation  No A fib or A flutter  EMMI video sent to pt's e mail pt declined   

## 2017-10-13 ENCOUNTER — Encounter: Payer: Self-pay | Admitting: Internal Medicine

## 2017-10-13 ENCOUNTER — Other Ambulatory Visit: Payer: Self-pay

## 2017-10-13 ENCOUNTER — Ambulatory Visit (AMBULATORY_SURGERY_CENTER): Payer: BLUE CROSS/BLUE SHIELD | Admitting: Internal Medicine

## 2017-10-13 VITALS — BP 130/70 | HR 76 | Temp 98.2°F | Resp 12 | Ht 70.0 in | Wt 295.0 lb

## 2017-10-13 DIAGNOSIS — Z1211 Encounter for screening for malignant neoplasm of colon: Secondary | ICD-10-CM

## 2017-10-13 DIAGNOSIS — D124 Benign neoplasm of descending colon: Secondary | ICD-10-CM

## 2017-10-13 DIAGNOSIS — D122 Benign neoplasm of ascending colon: Secondary | ICD-10-CM

## 2017-10-13 DIAGNOSIS — D123 Benign neoplasm of transverse colon: Secondary | ICD-10-CM | POA: Diagnosis not present

## 2017-10-13 MED ORDER — SODIUM CHLORIDE 0.9 % IV SOLN: 500.0000 mL | Freq: Once | INTRAVENOUS | Status: DC

## 2017-10-13 NOTE — Progress Notes (Signed)
Report to PACU, RN, vss, BBS= Clear.  

## 2017-10-13 NOTE — Progress Notes (Signed)
Pt's states no medical or surgical changes since previsit or office visit. 

## 2017-10-13 NOTE — Op Note (Signed)
[facility] Patient Name: [name] [name] Procedure Date: 10/13/2017 12:19 PM MRN: [id number] Endoscopist: Gatha Mayer , MD Age: 57 Referring MD:  Date of Birth: May 07, 1961 Gender: Male Account #: [id number] Procedure:                Colonoscopy Medicines:                Propofol per Anesthesia, Monitored Anesthesia Care Procedure:                Pre-Anesthesia Assessment:                           - Prior to the procedure, a History and Physical                            was performed, and patient medications and                            allergies were reviewed. The patient's tolerance of                            previous anesthesia was also reviewed. The risks                            and benefits of the procedure and the sedation                            options and risks were discussed with the patient.                            All questions were answered, and informed consent                            was obtained. Prior Anticoagulants: The patient has                            taken no previous anticoagulant or antiplatelet                            agents. ASA Grade Assessment: III - A patient with                            severe systemic disease. After reviewing the risks                            and benefits, the patient was deemed in                            satisfactory condition to undergo the procedure.                           After obtaining informed consent, the colonoscope                            was passed under direct vision. Throughout  the                            procedure, the patient's blood pressure, pulse, and                            oxygen saturations were monitored continuously. The                            Model CF-HQ190L [id number]) scope was introduced                            through the anus and advanced to the the cecum,                            identified by appendiceal orifice and ileocecal              valve. The colonoscopy was performed without                            difficulty. The patient tolerated the procedure                            well. The quality of the bowel preparation was                            good. The ileocecal valve, appendiceal orifice, and                            rectum were photographed. The bowel preparation                            used was Miralax. Scope In: 12:22:27 PM Scope Out: 71:24:58 PM Scope Withdrawal Time: 0 hours 12 minutes 29 seconds  Total Procedure Duration: 0 hours 13 minutes 51 seconds  Findings:                 The perianal and digital rectal examinations were                            normal. Pertinent negatives include normal prostate                            (size, shape, and consistency).                           Six sessile polyps were found in the descending                            colon, transverse colon and ascending colon. The                            polyps were diminutive in size. These polyps were  removed with a cold snare. Resection and retrieval                            were complete. Verification of patient                            identification for the specimen was done. Estimated                            blood loss was minimal.                           Scattered diverticula were found in the entire                            colon.                           There was evidence of a prior end-to-end                            colo-colonic anastomosis in the sigmoid colon. This                            was patent and was characterized by healthy                            appearing mucosa.                           The exam was otherwise without abnormality on                            direct and retroflexion views. Complications:            No immediate complications. Estimated Blood Loss:     Estimated blood loss was minimal. Impression:               - Six  diminutive polyps in the descending colon, in                            the transverse colon and in the ascending colon,                            removed with a cold snare. Resected and retrieved.                           - Diverticulosis in the entire examined colon.                           - Patent end-to-end colo-colonic anastomosis,                            characterized by healthy appearing mucosa.                           -  The examination was otherwise normal on direct                            and retroflexion views. Recommendation:           - Patient has a contact number available for                            emergencies. The signs and symptoms of potential                            delayed complications were discussed with the                            patient. Return to normal activities tomorrow.                            Written discharge instructions were provided to the                            patient.                           - Resume previous diet.                           - Continue present medications.                           - Repeat colonoscopy is recommended for                            surveillance. The colonoscopy date will be                            determined after pathology results from today's                            exam become available for review. Gatha Mayer, MD 10/13/2017 12:46:04 PM This report has been signed electronically.

## 2017-10-13 NOTE — Patient Instructions (Addendum)
I found and removed 6 tiny polyps. I will let you know pathology results and when to have another routine colonoscopy by mail and/or My Chart.  You also have some diverticulosis remaining.  I appreciate the opportunity to care for you. Gatha Mayer, MD, [facility]   Please read handouts on polyps and diverticulosis.  Await pathology results.   YOU HAD AN ENDOSCOPIC PROCEDURE TODAY AT [facility]:   Refer to the procedure report that was given to you for any specific questions about what was found during the examination.  If the procedure report does not answer your questions, please call your gastroenterologist to clarify.  If you requested that your care partner not be given the details of your procedure findings, then the procedure report has been included in a sealed envelope for you to review at your convenience later.  YOU SHOULD EXPECT: Some feelings of bloating in the abdomen. Passage of more gas than usual.  Walking can help get rid of the air that was put into your GI tract during the procedure and reduce the bloating. If you had a lower endoscopy (such as a colonoscopy or flexible sigmoidoscopy) you may notice spotting of blood in your stool or on the toilet paper. If you underwent a bowel prep for your procedure, you may not have a normal bowel movement for a few days.  Please Note:  You might notice some irritation and congestion in your nose or some drainage.  This is from the oxygen used during your procedure.  There is no need for concern and it should clear up in a day or so.  SYMPTOMS TO REPORT IMMEDIATELY:   Following lower endoscopy (colonoscopy or flexible sigmoidoscopy):  Excessive amounts of blood in the stool  Significant tenderness or worsening of abdominal pains  Swelling of the abdomen that is new, acute  Fever of 100F or higher   For urgent or emergent issues, a gastroenterologist can be reached at any hour by calling [phone]  [phone].   DIET:  We do recommend a small meal at first, but then you may proceed to your regular diet.  Drink plenty of fluids but you should avoid alcoholic beverages for 24 hours.  ACTIVITY:  You should plan to take it easy for the rest of today and you should NOT DRIVE or use heavy machinery until tomorrow (because of the sedation medicines used during the test).    FOLLOW UP: Our staff will call the number listed on your records the next business day following your procedure to check on you and address any questions or concerns that you may have regarding the information given to you following your procedure. If we do not reach you, we will leave a message.  However, if you are feeling well and you are not experiencing any problems, there is no need to return our call.  We will assume that you have returned to your regular daily activities without incident.  If any biopsies were taken you will be contacted by phone or by letter within the next 1-3 weeks.  Please call us at [phone] if you have not heard about the biopsies in 3 weeks.    SIGNATURES/CONFIDENTIALITY: You and/or your care partner have signed paperwork which will be entered into your electronic medical record.  These signatures attest to the fact that that the information above on your After Visit Summary has been reviewed and is understood.  Full responsibility of the confidentiality of this discharge  information lies with you and/or your care-partner. 

## 2017-10-13 NOTE — Progress Notes (Signed)
Called to room to assist during endoscopic procedure.  Patient ID and intended procedure confirmed with present staff. Received instructions for my participation in the procedure from the performing physician.  

## 2017-10-14 ENCOUNTER — Telehealth: Payer: Self-pay | Admitting: *Deleted

## 2017-10-14 NOTE — Telephone Encounter (Signed)
  Follow up Call-  Call back number 10/13/2017  Post procedure Call Back phone  # [phone]  Permission to leave phone message Yes  Some recent data might be hidden     Patient questions:  Do you have a fever, pain , or abdominal swelling? No. Pain Score  0 *  Have you tolerated food without any problems? Yes.    Have you been able to return to your normal activities? Yes.    Do you have any questions about your discharge instructions: Diet   No. Medications  No. Follow up visit  No.  Do you have questions or concerns about your Care? No.  Actions: * If pain score is 4 or above: No action needed, pain <4.

## 2017-10-19 ENCOUNTER — Encounter: Payer: Self-pay | Admitting: Internal Medicine

## 2017-10-19 DIAGNOSIS — Z860101 Personal history of adenomatous and serrated colon polyps: Secondary | ICD-10-CM

## 2017-10-19 DIAGNOSIS — Z86010 Personal history of colonic polyps: Secondary | ICD-10-CM

## 2017-10-19 HISTORY — DX: Personal history of adenomatous and serrated colon polyps: Z86.0101

## 2017-10-19 HISTORY — DX: Personal history of colonic polyps: Z86.010

## 2017-10-19 NOTE — Progress Notes (Signed)
6 adenomas recall 2022

## 2017-10-22 ENCOUNTER — Telehealth: Payer: Self-pay | Admitting: Family Medicine

## 2017-10-22 MED ORDER — AMLODIPINE BESYLATE 10 MG PO TABS: 10.0000 mg | ORAL_TABLET | Freq: Every day | ORAL | 0 refills | Status: DC

## 2017-10-22 NOTE — Telephone Encounter (Signed)
Pt called to check status, has 1 pill left for tonight.

## 2017-10-22 NOTE — Telephone Encounter (Signed)
Copied from [place] [id number]. Topic: Quick Communication - Rx Refill/Question >> Oct 22, 2017 10:15 AM Robina Ade, Helene Kelp D wrote: Medication: amLODipine (NORVASC) 10 MG tablet  Has the patient contacted their pharmacy? Yes patient needs enough to last him until his appt. Next week. (Agent: If no, request that the patient contact the pharmacy for the refill.) (Agent: If yes, when and what did the pharmacy advise?)  Preferred Pharmacy (with phone number or street name): [id number] - [address], [address]  Agent: Please be advised that RX refills may take up to 3 business days. We ask that you follow-up with your pharmacy.

## 2017-10-29 ENCOUNTER — Ambulatory Visit: Payer: BLUE CROSS/BLUE SHIELD

## 2017-10-29 ENCOUNTER — Other Ambulatory Visit: Payer: Self-pay

## 2017-10-31 LAB — TSH: TSH: 0.8 (ref 0.41–5.90)

## 2017-10-31 LAB — CBC AND DIFFERENTIAL
HEMATOCRIT: 45 (ref 41–53)
Hemoglobin: 15.6 (ref 13.5–17.5)
PLATELETS: 274 (ref 150–399)
WBC: 6.2

## 2017-10-31 LAB — HEPATIC FUNCTION PANEL
ALT: 33 (ref 10–40)
AST: 24 (ref 14–40)
Alkaline Phosphatase: 53 (ref 25–125)
Bilirubin, Total: 0.5

## 2017-10-31 LAB — LIPID PANEL
CHOLESTEROL: 204 — AB (ref 0–200)
HDL: 56 (ref 35–70)
LDL CALC: 127
LDL/HDL RATIO: 2.3
Triglycerides: 106 (ref 40–160)

## 2017-10-31 LAB — BASIC METABOLIC PANEL
BUN: 17 (ref 4–21)
Creatinine: 0.8 (ref 0.6–1.3)
GLUCOSE: 112
Potassium: 4.5 (ref 3.4–5.3)
Sodium: 139 (ref 137–147)

## 2017-10-31 LAB — HEMOGLOBIN A1C: Hemoglobin A1C: 5.5

## 2017-10-31 LAB — VITAMIN D 25 HYDROXY (VIT D DEFICIENCY, FRACTURES): Vit D, 25-Hydroxy: 39.3

## 2017-11-04 ENCOUNTER — Ambulatory Visit: Payer: BLUE CROSS/BLUE SHIELD | Admitting: Family Medicine

## 2017-11-05 ENCOUNTER — Encounter: Payer: Self-pay | Admitting: Family Medicine

## 2017-11-05 ENCOUNTER — Other Ambulatory Visit: Payer: Self-pay | Admitting: Family Medicine

## 2017-11-05 ENCOUNTER — Ambulatory Visit: Payer: BLUE CROSS/BLUE SHIELD | Admitting: Family Medicine

## 2017-11-05 VITALS — BP 118/70 | HR 90 | Temp 98.2°F | Ht 70.0 in | Wt 283.2 lb

## 2017-11-05 DIAGNOSIS — Z1159 Encounter for screening for other viral diseases: Secondary | ICD-10-CM

## 2017-11-05 DIAGNOSIS — Z114 Encounter for screening for human immunodeficiency virus [HIV]: Secondary | ICD-10-CM | POA: Diagnosis not present

## 2017-11-05 DIAGNOSIS — I10 Essential (primary) hypertension: Secondary | ICD-10-CM | POA: Diagnosis not present

## 2017-11-05 DIAGNOSIS — Z23 Encounter for immunization: Secondary | ICD-10-CM

## 2017-11-05 NOTE — Patient Instructions (Addendum)
Health Maintenance Due  Topic Date Due  . Hepatitis C Screening -ordered today 11-Nov-1960  . HIV Screening -ordered today 09/06/1973   MMR booster before you leave  Blood pressure looks great today- I think weight loss will help in long run- we may even have to reduce medicines in future with weight loss so watch out for symptoms like abnormal fatigue or lightheadedness particularly standing  Please stop by lab before you leave today

## 2017-11-05 NOTE — Telephone Encounter (Signed)
Copied from [place] [phone]. Topic: Quick Communication - Rx Refill/Question >> Nov 05, 2017 11:07 AM Selinda Flavin B, NT wrote: Medication: amLODipine (NORVASC) 10 MG tablet  (90 day supply) olmesartan (BENICAR) 40 MG tablet   (90 day supply)  Has the patient contacted their pharmacy? Yes.   (Agent: If no, request that the patient contact the pharmacy for the refill.) (Agent: If yes, when and what did the pharmacy advise?)  Preferred Pharmacy (with phone number or street name): [facility]  Agent: Please be advised that RX refills may take up to 3 business days. We ask that you follow-up with your pharmacy.

## 2017-11-05 NOTE — Progress Notes (Signed)
Subjective:  [name] [name] is a 57 y.o. year old very pleasant male patient who presents for/with See problem oriented charting ROS- No chest pain or shortness of breath. No headache or blurry vision.    Past Medical History-  Patient Active Problem List   Diagnosis Date Noted  . Hx of adenomatous colonic polyps 10/19/2017    Priority: Medium  . HTN (hypertension) 05/09/2012    Priority: Medium  . Diverticulitis     Priority: Low  . Stress fracture 07/04/2014    Priority: Low  . Right-sided thoracic back pain 11/28/2013    Priority: Low  . Morbid obesity (HCC) 11/06/2017    Medications- reviewed and updated Current Outpatient Medications  Medication Sig Dispense Refill  . acetaminophen (TYLENOL) 500 MG tablet Take 500 mg by mouth every 6 (six) hours as needed for pain.    . ibuprofen (ADVIL,MOTRIN) 200 MG tablet Take 200 mg by mouth every 6 (six) hours as needed.    . amLODipine (NORVASC) 10 MG tablet Take 1 tablet (10 mg total) by mouth daily. 90 tablet 1  . olmesartan (BENICAR) 40 MG tablet Take 1 tablet (40 mg total) by mouth at bedtime. 90 tablet 1   No current facility-administered medications for this visit.     Objective: BP 118/70 (BP Location: Left Arm, Patient Position: Sitting, Cuff Size: Large)   Pulse 90   Temp 98.2 F (36.8 C) (Oral)   Ht 5' 10" (1.778 m)   Wt 283 lb 3.2 oz (128.5 kg)   SpO2 97%   BMI 40.63 kg/m  Gen: NAD, resting comfortably CV: RRR no murmurs rubs or gallops Lungs: CTAB no crackles, wheeze, rhonchi Abdomen: soft/obese Ext: no edema Skin: warm, dry  Assessment/Plan:  Other notes: 1. MMR booster today- does not have his original records but he does think he received MMR booster- would have been in 60s which is high risk period for inadequate coverage  HTN (hypertension) S: controlled on amlodipine 10mg, olmesartan 40mg BP Readings from Last 3 Encounters:  11/05/17 118/70  10/13/17 130/70  07/28/17 140/80  A/P: We discussed  blood pressure goal of <140/90. Continue current meds. Also discussed importance of weight loss- he is wanting to work on this through [facility]  Morbid obesity (HCC) S: has had 2 appointments with [facility]- working on weight loss plan. He is up 5 lbs from last visit here in February.   Was told borderline for low T. They are considering using clomid.  A/P: Patient asks my opinion of [facility]- I told him I have had some patients have good weight loss results. Discussed other options with patient such as weight watchers but he wants to consider [facility] for now- has really had a hard time doing this on his own lately. BMI now over 40 and more aggressive approach is reasonable   Future Appointments  Date Time Provider Department Center  03/10/2018  1:15 PM Hunter, Stephen O, MD [facility]   Lab/Order associations: Screening for HIV (human immunodeficiency virus) - Plan: HIV antibody (with reflex)  Encounter for hepatitis C screening test for low risk patient - Plan: Hepatitis C Antibody  Need for prophylactic vaccination and inoculation against varicella - Plan: Varicella-zoster vaccine IM  Need for prophylactic vaccination with measles-mumps-rubella (MMR) vaccine - Plan: MMR vaccine subcutaneous  Essential hypertension  Morbid obesity (HCC)  Return precautions advised.  Stephen Hunter, MD   

## 2017-11-06 LAB — HIV ANTIBODY (ROUTINE TESTING W REFLEX): HIV 1&2 Ab, 4th Generation: NONREACTIVE

## 2017-11-06 LAB — HEPATITIS C ANTIBODY
HEP C AB: NONREACTIVE
SIGNAL TO CUT-OFF: 0.02 (ref <1.00)

## 2017-11-06 MED ORDER — OLMESARTAN MEDOXOMIL 40 MG PO TABS: 40.0000 mg | ORAL_TABLET | Freq: Every day | ORAL | 1 refills | Status: DC

## 2017-11-06 MED ORDER — AMLODIPINE BESYLATE 10 MG PO TABS: 10.0000 mg | ORAL_TABLET | Freq: Every day | ORAL | 1 refills | Status: DC

## 2017-11-06 NOTE — Telephone Encounter (Signed)
Pt requesting 90 day supply of Amlodipine and Olmseartan.   LOV: 10/28/17 Dr. Yong Channel  [facility]

## 2017-11-06 NOTE — Assessment & Plan Note (Signed)
S: has had 2 appointments with [facility]- working on weight loss plan. He is up 5 lbs from last visit here in February.   Was told borderline for low T. They are considering using clomid.  A/P: Patient asks my opinion of [facility]- I told him I have had some patients have good weight loss results. Discussed other options with patient such as weight watchers but he wants to consider [facility] for now- has really had a hard time doing this on his own lately. BMI now over 40 and more aggressive approach is reasonable

## 2017-11-06 NOTE — Assessment & Plan Note (Signed)
S: controlled on amlodipine 10mg , olmesartan 40mg  BP Readings from Last 3 Encounters:  11/05/17 118/70  10/13/17 130/70  07/28/17 140/80  A/P: We discussed blood pressure goal of <140/90. Continue current meds. Also discussed importance of weight loss- he is wanting to work on this through [employer]

## 2017-11-06 NOTE — Telephone Encounter (Signed)
See note

## 2017-11-17 ENCOUNTER — Other Ambulatory Visit: Payer: Self-pay | Admitting: Family Medicine

## 2017-12-11 ENCOUNTER — Other Ambulatory Visit: Payer: Self-pay | Admitting: Family Medicine

## 2018-01-23 DIAGNOSIS — J310 Chronic rhinitis: Secondary | ICD-10-CM | POA: Insufficient documentation

## 2018-01-23 DIAGNOSIS — H6123 Impacted cerumen, bilateral: Secondary | ICD-10-CM | POA: Insufficient documentation

## 2018-01-23 DIAGNOSIS — H6983 Other specified disorders of Eustachian tube, bilateral: Secondary | ICD-10-CM | POA: Insufficient documentation

## 2018-02-16 ENCOUNTER — Encounter: Payer: Self-pay | Admitting: Family Medicine

## 2018-02-16 ENCOUNTER — Ambulatory Visit: Payer: BLUE CROSS/BLUE SHIELD | Admitting: Family Medicine

## 2018-02-16 VITALS — BP 120/78 | HR 92 | Temp 98.5°F

## 2018-02-16 DIAGNOSIS — L089 Local infection of the skin and subcutaneous tissue, unspecified: Secondary | ICD-10-CM

## 2018-02-16 DIAGNOSIS — L723 Sebaceous cyst: Secondary | ICD-10-CM | POA: Diagnosis not present

## 2018-02-16 DIAGNOSIS — Z23 Encounter for immunization: Secondary | ICD-10-CM | POA: Diagnosis not present

## 2018-02-16 MED ORDER — DOXYCYCLINE HYCLATE 100 MG PO TABS: 100.0000 mg | ORAL_TABLET | Freq: Two times a day (BID) | ORAL | 0 refills | Status: AC

## 2018-02-16 NOTE — Addendum Note (Signed)
Addended by: Lucianne Lei M on: 02/16/2018 01:37 PM   Modules accepted: Orders

## 2018-02-16 NOTE — Progress Notes (Signed)
Subjective:  [name] [name] is a 57 y.o. year old very pleasant male patient who presents for/with See problem oriented charting ROS- no fever or chills. Calmed down after rupture and then seems to be getting slightly larger since then- spot on his back. Sore with palpation. No nausea or vomiting.   Past Medical History-  Patient Active Problem List   Diagnosis Date Noted  . Hx of adenomatous colonic polyps 10/19/2017    Priority: Medium  . HTN (hypertension) 05/09/2012    Priority: Medium  . Diverticulitis     Priority: Low  . Stress fracture 07/04/2014    Priority: Low  . Right-sided thoracic back pain 11/28/2013    Priority: Low  . Morbid obesity ([place]) 11/06/2017    Medications- reviewed and updated Current Outpatient Medications  Medication Sig Dispense Refill  . acetaminophen (TYLENOL) 500 MG tablet Take 500 mg by mouth every 6 (six) hours as needed for pain.    Marland Kitchen amLODipine (NORVASC) 10 MG tablet Take 1 tablet (10 mg total) by mouth daily. 90 tablet 1  . ibuprofen (ADVIL,MOTRIN) 200 MG tablet Take 200 mg by mouth every 6 (six) hours as needed.    Marland Kitchen olmesartan (BENICAR) 40 MG tablet Take 1 tablet (40 mg total) by mouth at bedtime. 90 tablet 1  . doxycycline (VIBRA-TABS) 100 MG tablet Take 1 tablet (100 mg total) by mouth 2 (two) times daily for 5 days. 10 tablet 0   No current facility-administered medications for this visit.     Objective: BP 120/78 (BP Location: Left Arm, Patient Position: Sitting, Cuff Size: Large)   Pulse 92   Temp 98.5 F (36.9 C) (Oral)   SpO2 95%  Gen: NAD, resting comfortably CV: RRR  Lungs: nonlabored, normal respiratory rate Abdomen: soft/nondistended Skin: warm, dry, on right mid back there is about 3 x 3 cm indurated area with central opening with white material. This was easily expressed- more purulent material and blood was expressed as well as some cottage cheese like discharge and otherwhitish material (likely capsule of cyst Neuro:  speech normal, moves all extremities  Assessment/Plan:  Infected sebaceous cyst S: Saturday afternoon- noticed a sting like pain in his low back and noted ruptured/bloody/purulent area- blood and pus on his shirt. Had not felt anything prior to this but when he looked it had obviously been there. If not pushing on it- almost no pain. With palpation 1-2/10 pain. Feels well othewise. 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 This had already ruptured but we expressed more purulent material, blood, as well as a good portion of the capsule of the cyst. Will cover with 5 days of doxycycline to be on safe side and prevent recurrence/worsening. Work note written. I asked him to return to see [place] for new or worsening symptoms or failure to continue to improve. Should change dressing at least once a day when bathing.    Future Appointments  Date Time Provider [place]  03/10/2018  1:15 PM Marin Olp, MD [facility]    Meds ordered this encounter  Medications  . doxycycline (VIBRA-TABS) 100 MG tablet    Sig: Take 1 tablet (100 mg total) by mouth 2 (two) times daily for 5 days.    Dispense:  10 tablet    Refill:  0   Return precautions advised.  Garret Reddish, MD

## 2018-02-16 NOTE — Patient Instructions (Signed)
Health Maintenance Due  Topic Date Due  . INFLUENZA VACCINE - today 12/11/2017    Infected sebaceous cyst noted on right mid back. This had already ruptured but we expressed more purulent material, blood, as well as a good portion of the capsule of the cyst. Will cover with 5 days of doxycycline to be on safe side and prevent recurrence/worsening. Work note written. I asked him to return to see [place] for new or worsening symptoms or failure to continue to improve. Should change dressing at least once a day when bathing.

## 2018-03-10 ENCOUNTER — Ambulatory Visit: Payer: BLUE CROSS/BLUE SHIELD | Admitting: Family Medicine

## 2018-03-10 ENCOUNTER — Encounter: Payer: Self-pay | Admitting: Family Medicine

## 2018-03-10 VITALS — BP 134/70 | HR 92 | Ht 70.0 in | Wt 290.0 lb

## 2018-03-10 DIAGNOSIS — Z6841 Body Mass Index (BMI) 40.0 and over, adult: Secondary | ICD-10-CM

## 2018-03-10 DIAGNOSIS — I10 Essential (primary) hypertension: Secondary | ICD-10-CM

## 2018-03-10 NOTE — Assessment & Plan Note (Addendum)
S: controlled on amlodipine 10 mg and olmesartan 40 mg. Systolic trending up some  BP Readings from Last 3 Encounters:  03/10/18 134/70  02/16/18 120/78  11/05/17 118/70  A/P: We discussed blood pressure goal of <140/90. Continue current meds- bp trending up some with weight gain and we focused on reversing this trend

## 2018-03-10 NOTE — Progress Notes (Signed)
Subjective:  [name] [name] is a 57 y.o. year old very pleasant male patient who presents for/with See problem oriented charting ROS- stable edema. No chest pain or shortness of breath. No headache or blurry vision.  Difficulty losing weight. Poor sleep due to his job.    Past Medical History-  Patient Active Problem List   Diagnosis Date Noted  . Hx of adenomatous colonic polyps 10/19/2017    Priority: Medium  . HTN (hypertension) 05/09/2012    Priority: Medium  . Diverticulitis     Priority: Low  . Stress fracture 07/04/2014    Priority: Low  . Right-sided thoracic back pain 11/28/2013    Priority: Low  . Morbid obesity ([place]) 11/06/2017    Medications- reviewed and updated Current Outpatient Medications  Medication Sig Dispense Refill  . acetaminophen (TYLENOL) 500 MG tablet Take 500 mg by mouth every 6 (six) hours as needed for pain.    Marland Kitchen amLODipine (NORVASC) 10 MG tablet Take 1 tablet (10 mg total) by mouth daily. 90 tablet 1  . ibuprofen (ADVIL,MOTRIN) 200 MG tablet Take 200 mg by mouth every 6 (six) hours as needed.    Marland Kitchen olmesartan (BENICAR) 40 MG tablet Take 1 tablet (40 mg total) by mouth at bedtime. 90 tablet 1   No current facility-administered medications for this visit.     Objective: BP 134/70 (BP Location: Left Arm, Patient Position: Sitting, Cuff Size: Large)   Pulse 92   Ht 5\' 10"  (1.778 m)   Wt 290 lb (131.5 kg)   SpO2 95%   BMI 41.61 kg/m  Gen: NAD, resting comfortably CV: RRR no murmurs rubs or gallops Lungs: CTAB no crackles, wheeze, rhonchi Abdomen: soft/nontender/nondistended/normal bowel sounds. No rebound or guarding.  Ext: trace edema Skin: warm, dry, on back has healed/scabbed over cyst. Still with induration about nickel size but not painful   Assessment/Plan:  Other notes: 1.Infected sebaceous cyst treated earlier this month-appears to be healing. About nickel sized induration- if recurrence of infection or doesn't further reduce in size-  consider referral to dermatology if needed in future 2. Outside labs with [facility]- PSA was in normal range. Testosterone low normal but still normal.   HTN (hypertension) S: controlled on amlodipine 10 mg and olmesartan 40 mg. Systolic trending up some  BP Readings from Last 3 Encounters:  03/10/18 134/70  02/16/18 120/78  11/05/17 118/70  A/P: We discussed blood pressure goal of <140/90. Continue current meds- bp trending up some with weight gain and we focused on reversing this trend     Morbid obesity ([place]) S: weight tending up unfortunately from last visit. Patient at  Last regular visit he mentioned going to [facility] to help with weight loss to further help with blood pressure- they wanted to start him on 1200 calorie diet and he didn't want to go through with that (weight up 7 lbs). Neighbor is on diabetic diet- wife and husband are following a diet and he would like to try that. Exercise has been down lately.   Wt Readings from Last 3 Encounters:  03/10/18 290 lb (131.5 kg)  11/05/17 283 lb 3.2 oz (128.5 kg)  10/13/17 295 lb (133.8 kg)  A/P: poor control of obesity. Focused more on lifestyle than weight itself. Encouraged need for healthy eating, regular exercise. Discussed SLAM type goals (Specific, limited, actionable, measurable)- he is not ready to commit to any today but will think it over- see avs discussion  Return in about 6 months (around  09/09/2018) for physical.  Return precautions advised.  Garret Reddish, MD

## 2018-03-10 NOTE — Patient Instructions (Addendum)
I like your idea of getting with your neighbors- that may be an option for accountability as well.   Consider setting an actionable exercise as well.   Blood pressure looks good today  Keep an eye on the cyst- glad its doing better. We can refer to dermatology if needed for excision.

## 2018-03-10 NOTE — Assessment & Plan Note (Addendum)
S: weight tending up unfortunately from last visit. Patient at  Last regular visit he mentioned going to [facility] to help with weight loss to further help with blood pressure- they wanted to start him on 1200 calorie diet and he didn't want to go through with that (weight up 7 lbs). Neighbor is on diabetic diet- wife and husband are following a diet and he would like to try that. Exercise has been down lately.   Wt Readings from Last 3 Encounters:  03/10/18 290 lb (131.5 kg)  11/05/17 283 lb 3.2 oz (128.5 kg)  10/13/17 295 lb (133.8 kg)  A/P: poor control of obesity. Focused more on lifestyle than weight itself. Encouraged need for healthy eating, regular exercise. Discussed SLAM type goals (Specific, limited, actionable, measurable)- he is not ready to commit to any today but will think it over- see avs discussion

## 2018-04-17 ENCOUNTER — Other Ambulatory Visit: Payer: Self-pay | Admitting: Family Medicine

## 2018-05-05 ENCOUNTER — Other Ambulatory Visit: Payer: Self-pay | Admitting: Family Medicine

## 2018-06-13 ENCOUNTER — Encounter (HOSPITAL_COMMUNITY): Payer: Self-pay | Admitting: *Deleted

## 2018-06-13 ENCOUNTER — Other Ambulatory Visit: Payer: Self-pay

## 2018-06-13 ENCOUNTER — Emergency Department (HOSPITAL_COMMUNITY)
Admission: EM | Admit: 2018-06-13 | Discharge: 2018-06-13 | Disposition: A | Discharge status: Home / Self Care | Payer: BLUE CROSS/BLUE SHIELD | Attending: Emergency Medicine | Admitting: Emergency Medicine

## 2018-06-13 DIAGNOSIS — Z87891 Personal history of nicotine dependence: Secondary | ICD-10-CM | POA: Insufficient documentation

## 2018-06-13 DIAGNOSIS — Z79899 Other long term (current) drug therapy: Secondary | ICD-10-CM | POA: Diagnosis not present

## 2018-06-13 DIAGNOSIS — I10 Essential (primary) hypertension: Secondary | ICD-10-CM | POA: Diagnosis not present

## 2018-06-13 DIAGNOSIS — J069 Acute upper respiratory infection, unspecified: Secondary | ICD-10-CM | POA: Insufficient documentation

## 2018-06-13 DIAGNOSIS — R0981 Nasal congestion: Secondary | ICD-10-CM | POA: Diagnosis present

## 2018-06-13 NOTE — ED Notes (Signed)
ED Provider at bedside. 

## 2018-06-13 NOTE — ED Triage Notes (Signed)
Pt complains of cough, nausea, dizziness since last night. Pt states he was in [place], [place] on 1/25 and was in a room with several people wearing masks. Pt then heard people were quarantined in [place] for coronavirus. Pt was told by PCP to come to ED.

## 2018-06-13 NOTE — ED Provider Notes (Signed)
[facility] DEPT Provider Note   CSN: [id number] Arrival date & time: 06/13/18  1058     History   Chief Complaint Chief Complaint  Patient presents with  . URI    recent travel, concerned for coronavirus    HPI [name] [name] is a 58 y.o. male.  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  He flew into [place] from [place] on January 24.  He stayed over at a busy hotel.  He reports that there were a number of Asian clients wearing masks.  He then flew back to [place] on the 26th.  Patient reports last night (1/31), he started coming down with cold symptoms.  He reports nasal congestion slight sore throat mild cough.  He has not had any documented fever.  Small amount of loose stool.  He reports overall he feels like he has a mild cold but is concerned due to concurrent cases of coronavirus in [place] right now.  Patient's only medical history is hypertension.  He takes antihypertensive medications and is otherwise healthy. Past Medical History:  Diagnosis Date  . Allergy   . Diverticulitis    leading to approx 1 foot resection-colectomy  . History of kidney stones   . Hx of adenomatous colonic polyps 10/19/2017  . Hypertension   . NEPHROLITHIASIS, HX OF 03/09/2007  . PONV (postoperative nausea and vomiting)   . SPRAIN/STRAIN, ANKLE NOS 01/05/2007    Patient Active Problem List   Diagnosis Date Noted  . Morbid obesity ([place]) 11/06/2017  . Hx of adenomatous colonic polyps 10/19/2017  . Diverticulitis   . Stress fracture 07/04/2014  . Right-sided thoracic back pain 11/28/2013  . HTN (hypertension) 05/09/2012    Past Surgical History:  Procedure Laterality Date  . COLECTOMY  09/2008   Dr Excell Seltzer  . COLONOSCOPY    . HERNIA REPAIR  09/18/12   lap incisional hernia repair, related to colectomy  . VENTRAL HERNIA REPAIR N/A 09/18/2012        Home Medications    Prior to Admission medications   Medication Sig Start Date End Date  Taking? Authorizing Provider  acetaminophen (TYLENOL) 500 MG tablet Take 500 mg by mouth every 6 (six) hours as needed for pain.    [provider]  amLODipine (NORVASC) 10 MG tablet TAKE 1 TABLET DAILY 04/17/18   Marin Olp, MD  ibuprofen (ADVIL,MOTRIN) 200 MG tablet Take 200 mg by mouth every 6 (six) hours as needed.    [provider]  olmesartan (BENICAR) 40 MG tablet TAKE 1 TABLET AT BEDTIME 05/05/18   Marin Olp, MD    Family History Family History  Problem Relation Age of Onset  . CVA Mother        45  . Heart attack Father        78  . Colon cancer Neg Hx   . Colon polyps Neg Hx   . Esophageal cancer Neg Hx   . Rectal cancer Neg Hx   . Stomach cancer Neg Hx     Social History Social History   Tobacco Use  . Smoking status: Former Smoker    Types: Cigarettes, E-cigarettes    Last attempt to quit: 05/13/2016    Years since quitting: 2.0  . Smokeless tobacco: Current User  . Tobacco comment: Doing Vape in the evenings  Substance Use Topics  . Alcohol use: Yes    Alcohol/week: 0.0 standard drinks    Comment: occasionally  . Drug  use: No     Allergies   Benadryl [diphenhydramine hcl] and Morphine and related   Review of Systems Review of Systems 10 Systems reviewed and are negative for acute change except as noted in the HPI.   Physical Exam Updated Vital Signs BP 112/86 (BP Location: Left Arm)   Pulse 79   Temp 98.3 F (36.8 C) (Oral)   Resp 18   SpO2 96%   Physical Exam Constitutional:      Comments: Patient is alert and nontoxic.  No respiratory distress.  Clear mental status.  HENT:     Head: Normocephalic and atraumatic.     Nose: Nose normal.     Mouth/Throat:     Mouth: Mucous membranes are moist.     Comments: Posterior oropharynx diffuse erythema of the tonsillar pillars. Eyes:     Extraocular Movements: Extraocular movements intact.     Conjunctiva/sclera: Conjunctivae normal.  Cardiovascular:     Rate and  Rhythm: Normal rate.  Pulmonary:     Effort: Pulmonary effort is normal.     Breath sounds: Normal breath sounds.  Abdominal:     General: There is no distension.     Palpations: Abdomen is soft.     Tenderness: There is no abdominal tenderness. There is no guarding.  Musculoskeletal: Normal range of motion.        General: No swelling.     Right lower leg: No edema.     Left lower leg: No edema.  Skin:    General: Skin is warm and dry.  Neurological:     General: No focal deficit present.     Mental Status: He is oriented to person, place, and time.     Coordination: Coordination normal.  Psychiatric:        Mood and Affect: Mood normal.      ED Treatments / Results  Labs (all labs ordered are listed, but only abnormal results are displayed) Labs Reviewed - No data to display  EKG None  Radiology No results found.  Procedures Procedures (including critical care time)  Medications Ordered in ED Medications - No data to display   Initial Impression / Assessment and Plan / ED Course  I have reviewed the triage vital signs and the nursing notes.  Pertinent labs & imaging results that were available during my care of the patient were reviewed by me and considered in my medical decision making (see chart for details).    Consult: Reviewed to Dr. Novella Olive infectious disease.  Advises that at this time criteria for coronavirus evaluation/management is someone who has direct contact with individual who has been in wound hand problems within the past 14 days.  At this time, patient's circumstance of travel to [place] with no known direct contact to sick individuals or high risk individuals does not meet criteria for specific coronavirus evaluation.  Patient should continue good hygiene practices consistent with viral illness.  Patient is clinically well.  He has not had fever.  He started URI symptoms yesterday evening.  At this time, he will remain very cautious about exposures  to anyone else until symptoms are improving or other symptoms develop.  He is counseled to follow-up with his primary care doctor on Wednesday before considering another travel course.  He is counseled to return emergency department should symptoms be worsening.  Final Clinical Impressions(s) / ED Diagnoses   Final diagnoses:  Viral upper respiratory tract infection    ED Discharge Orders    None  Charlesetta Shanks, MD 06/13/18 1242

## 2018-06-13 NOTE — Discharge Instructions (Signed)
1.  At this time you are still in a low risk category for coronavirus, by definition of not having direct contact with someone who is been to [facility] within 14 days.  2.  Remain cautious of your exposure to other people until you are clearly improving.  Use good hand hygiene and avoid coughing or sneezing in the presence of others. 3.  Schedule an appointment with your doctor for recheck and clearance in 3 days. 4.  Return to the emergency department if you feel your symptoms are worsening.

## 2018-06-15 ENCOUNTER — Telehealth: Payer: Self-pay | Admitting: Family Medicine

## 2018-06-15 NOTE — Telephone Encounter (Signed)
See note  Copied from [place] [id number]. Topic: Appointment Scheduling - Scheduling Inquiry for Clinic >> Jun 15, 2018  9:40 AM Virl Axe D wrote: Reason for CRM: Pt called to schedule HFU from ED visit on 06/13/18. 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 He stated there was a very small chance of possible exposure to coronavirus while there but attending physician at ED recommended followup with PCP on today or 06/16/18. Dr. Ansel Bong first [facility] opening is 06/17/18. Pt would like to be seen today or tomorrow the latest. Please advise pt if he can be worked in. CB# [phone]

## 2018-06-15 NOTE — Telephone Encounter (Signed)
Patient is scheduled for tomorrow with Sam at 1:20pm

## 2018-06-16 ENCOUNTER — Encounter: Payer: Self-pay | Admitting: Physician Assistant

## 2018-06-16 ENCOUNTER — Ambulatory Visit: Payer: BLUE CROSS/BLUE SHIELD | Admitting: Physician Assistant

## 2018-06-16 VITALS — BP 130/84 | HR 103 | Temp 98.7°F | Ht 70.0 in

## 2018-06-16 DIAGNOSIS — J069 Acute upper respiratory infection, unspecified: Secondary | ICD-10-CM

## 2018-06-16 NOTE — Progress Notes (Signed)
[name] [name] is a 58 y.o. male is here for ER follow up  I acted as a Education administrator for [employer], PA-C Anselmo Pickler, LPN  History of Present Illness:   Chief Complaint  Patient presents with  . ER Follow up    HPI   Patient was seen in the ER on 06/13/18 for URI. He is a Catering manager for [employer]. He was concerned that he might have been exposed to coronovirus because of what he was reading on the news and because he was recently on a flight in [place], [place].  Symptoms at that time were: nasal congestion, sore throat, mild cough  Denies: SOB, fever, dizziness, n/v  Infectious disease was consulted while in the ER. Per ED note "Dr. Novella Olive (from ID) Advises that at this time criteria for coronavirus evaluation/management is someone who has direct contact with individual who has been in [place] within the past 14 days.  At this time, patient's circumstance of travel to [place] with no known direct contact to sick individuals or high risk individuals does not meet criteria for specific coronavirus evaluation.  Patient should continue good hygiene practices consistent with viral illness."  Patient reports that he has been clinically improving since he has gone to the ER.  There are no preventive care reminders to display for this patient.  Past Medical History:  Diagnosis Date  . Allergy   . Diverticulitis    leading to approx 1 foot resection-colectomy  . History of kidney stones   . Hx of adenomatous colonic polyps 10/19/2017  . Hypertension   . NEPHROLITHIASIS, HX OF 03/09/2007  . PONV (postoperative nausea and vomiting)   . SPRAIN/STRAIN, ANKLE NOS 01/05/2007     Social History   Socioeconomic History  . Marital status: Single    Spouse name: Not on file  . Number of children: Not on file  . Years of education: Not on file  . Highest education level: Not on file  Occupational History  . Not on file  Social Needs  . Financial resource strain: Not  on file  . Food insecurity:    Worry: Not on file    Inability: Not on file  . Transportation needs:    Medical: Not on file    Non-medical: Not on file  Tobacco Use  . Smoking status: Former Smoker    Types: Cigarettes, E-cigarettes    Last attempt to quit: 05/13/2016    Years since quitting: 2.0  . Smokeless tobacco: Current User  . Tobacco comment: Doing Vape in the evenings  Substance and Sexual Activity  . Alcohol use: Yes    Alcohol/week: 0.0 standard drinks    Comment: occasionally  . Drug use: No  . Sexual activity: Never  Lifestyle  . Physical activity:    Days per week: Not on file    Minutes per session: Not on file  . Stress: Not on file  Relationships  . Social connections:    Talks on phone: Not on file    Gets together: Not on file    Attends religious service: Not on file    Active member of club or organization: Not on file    Attends meetings of clubs or organizations: Not on file    Relationship status: Not on file  . Intimate partner violence:    Fear of current or ex partner: Not on file    Emotionally abused: Not on file    Physically abused: Not on  file    Forced sexual activity: Not on file  Other Topics Concern  . Not on file  Social History Narrative   Single. Lives alone. Monogamous.    Long term friend of Dr. 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       Hobbies: working around [employer], mountain bike    Past Surgical History:  Procedure Laterality Date  . COLECTOMY  09/2008   Dr Excell Seltzer  . COLONOSCOPY    . HERNIA REPAIR  09/18/12   lap incisional hernia repair, related to colectomy  . VENTRAL HERNIA REPAIR N/A 09/18/2012    Family History  Problem Relation Age of Onset  . CVA Mother        77  . Heart attack Father        52  . Colon cancer Neg Hx   . Colon polyps Neg Hx   . Esophageal cancer Neg Hx   . Rectal cancer Neg Hx   . Stomach cancer Neg Hx     PMHx, SurgHx, SocialHx,  FamHx, Medications, and Allergies were reviewed in the Visit Navigator and updated as appropriate.   Patient Active Problem List   Diagnosis Date Noted  . Morbid obesity ([place]) 11/06/2017  . Hx of adenomatous colonic polyps 10/19/2017  . Diverticulitis   . Stress fracture 07/04/2014  . Right-sided thoracic back pain 11/28/2013  . HTN (hypertension) 05/09/2012    Social History   Tobacco Use  . Smoking status: Former Smoker    Types: Cigarettes, E-cigarettes    Last attempt to quit: 05/13/2016    Years since quitting: 2.0  . Smokeless tobacco: Current User  . Tobacco comment: Doing Vape in the evenings  Substance Use Topics  . Alcohol use: Yes    Alcohol/week: 0.0 standard drinks    Comment: occasionally  . Drug use: No    Current Medications and Allergies:    Current Outpatient Medications:  .  acetaminophen (TYLENOL) 500 MG tablet, Take 500 mg by mouth every 6 (six) hours as needed for pain., Disp: , Rfl:  .  amLODipine (NORVASC) 10 MG tablet, TAKE 1 TABLET DAILY, Disp: 90 tablet, Rfl: 3 .  ibuprofen (ADVIL,MOTRIN) 200 MG tablet, Take 200 mg by mouth every 6 (six) hours as needed., Disp: , Rfl:  .  olmesartan (BENICAR) 40 MG tablet, TAKE 1 TABLET AT BEDTIME, Disp: 90 tablet, Rfl: 3   Allergies  Allergen Reactions  . Benadryl [Diphenhydramine Hcl] Itching    Patient stated allergy after ordered by MD  . Morphine And Related Itching    Review of Systems   Review of Systems  Constitutional: Negative for chills, fever, malaise/fatigue and weight loss.  HENT: Positive for congestion. Negative for ear discharge, ear pain, hearing loss, sinus pain and tinnitus.   Respiratory: Negative for shortness of breath.   Cardiovascular: Negative for chest pain, orthopnea, claudication and leg swelling.  Gastrointestinal: Negative for heartburn, nausea and vomiting.  Neurological: Negative for dizziness, tingling and headaches.      Vitals:   Vitals:   06/16/18 1324  BP:  130/84  Pulse: (!) 103  Temp: 98.7 F (37.1 C)  TempSrc: Oral  SpO2: 96%  Height: 5\' 10"  (1.778 m)     Body mass index is 41.61 kg/m.   Physical Exam:    Physical Exam Vitals signs and nursing note reviewed.  Constitutional:      General: He is not in acute  distress.    Appearance: He is well-developed. He is not ill-appearing or toxic-appearing.  HENT:     Head: Normocephalic and atraumatic.     Right Ear: Tympanic membrane, ear canal and external ear normal. Tympanic membrane is not erythematous, retracted or bulging.     Left Ear: Tympanic membrane, ear canal and external ear normal. Tympanic membrane is not erythematous, retracted or bulging.     Nose: Mucosal edema, congestion and rhinorrhea present.     Right Sinus: No maxillary sinus tenderness or frontal sinus tenderness.     Left Sinus: No maxillary sinus tenderness or frontal sinus tenderness.     Mouth/Throat:     Lips: Pink.     Mouth: Mucous membranes are moist.     Pharynx: Uvula midline. Posterior oropharyngeal erythema present.     Tonsils: Swelling: 0 on the right. 0 on the left.  Eyes:     General: Lids are normal.     Conjunctiva/sclera: Conjunctivae normal.  Neck:     Trachea: Trachea normal.  Cardiovascular:     Rate and Rhythm: Normal rate and regular rhythm.     Pulses: Normal pulses.     Heart sounds: Normal heart sounds, S1 normal and S2 normal.     Comments: No LE edema Pulmonary:     Effort: Pulmonary effort is normal.     Breath sounds: Normal breath sounds. No decreased breath sounds, wheezing, rhonchi or rales.  Lymphadenopathy:     Cervical: No cervical adenopathy.  Skin:    General: Skin is warm and dry.  Neurological:     Mental Status: He is alert.     GCS: GCS eye subscore is 4. GCS verbal subscore is 5. GCS motor subscore is 6.  Psychiatric:        Speech: Speech normal.        Behavior: Behavior normal. Behavior is cooperative.      Assessment and Plan:    Alanson was  seen today for er follow up.  Diagnoses and all orders for this visit:  Upper respiratory tract infection, unspecified type   No red flags on exam.  He is improving with symptomatic care. No indication for further intervention at this time.  Reviewed return precautions including worsening fever, SOB, worsening cough or other concerns. Push fluids and rest. I recommend that patient follow-up if symptoms worsen or persist despite treatment x 7-10 days, sooner if needed.  . Reviewed expectations re: course of current medical issues. . Discussed self-management of symptoms. . Outlined signs and symptoms indicating need for more acute intervention. . Patient verbalized understanding and all questions were answered. . See orders for this visit as documented in the electronic medical record. . Patient received an After Visit Summary.  CMA or LPN served as scribe during this visit. History, Physical, and Plan performed by medical provider. The above documentation has been reviewed and is accurate and complete.   Inda Coke, PA-C [place], [place] 06/16/2018  Follow-up: No follow-ups on file.

## 2018-06-22 ENCOUNTER — Encounter: Payer: Self-pay | Admitting: Family Medicine

## 2018-06-22 ENCOUNTER — Ambulatory Visit: Payer: BLUE CROSS/BLUE SHIELD | Admitting: Family Medicine

## 2018-06-22 VITALS — BP 116/80 | HR 101 | Temp 97.9°F | Ht 70.0 in

## 2018-06-22 DIAGNOSIS — J329 Chronic sinusitis, unspecified: Secondary | ICD-10-CM | POA: Diagnosis not present

## 2018-06-22 MED ORDER — IPRATROPIUM BROMIDE 0.06 % NA SOLN: 2.0000 | Freq: Four times a day (QID) | NASAL | 0 refills | Status: DC

## 2018-06-22 MED ORDER — METHYLPREDNISOLONE ACETATE 80 MG/ML IJ SUSP
80.0000 mg | Freq: Once | INTRAMUSCULAR | Status: AC
  Administered 2018-06-22: 80 mg via INTRAMUSCULAR

## 2018-06-22 MED ORDER — AMOXICILLIN-POT CLAVULANATE 875-125 MG PO TABS: 1.0000 | ORAL_TABLET | Freq: Two times a day (BID) | ORAL | 0 refills | Status: DC

## 2018-06-22 NOTE — Progress Notes (Signed)
   Chief Complaint:  [name] [name] is a 58 y.o. male who presents for same day appointment with a chief complaint of sinus congestion.   Assessment/Plan:  Sinusitis Given the symptoms of been persistent for 2 weeks and are worsening, will start course of Augmentin today.  Also start Atrovent nasal spray.  Will give 80 mg of IM Depo-Medrol today as well.  Encouraged good oral hydration.  Continue over-the-counter analgesics as needed discussed reasons to return to care.  Follow-up as needed.    Subjective:  HPI:  Sinus Congestion, acute problem Started a couple of weeks ago. Worsened over that time.  Has tried over-the-counter medications which have not helped.  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  No fevers.  No chills.  No sputum production.  Has had some left-sided facial pain.  No other obvious alleviating or aggravating factors.   ROS: Per HPI  PMH: He reports that he quit smoking about 2 years ago. His smoking use included cigarettes and e-cigarettes. He uses smokeless tobacco. He reports current alcohol use. He reports that he does not use drugs.      Objective:  Physical Exam: BP 116/80 (BP Location: Left Arm, Patient Position: Sitting, Cuff Size: Large)   Pulse (!) 101   Temp 97.9 F (36.6 C) (Oral)   Ht 5\' 10"  (1.778 m)   SpO2 96%   BMI 41.61 kg/m   Gen: NAD, resting comfortably HEENT: TMs clear.  Oropharynx erythematous with no exudate.  Nasal mucosa erythematous and boggy bilaterally. CV: Regular rate and rhythm with no murmurs appreciated Pulm: Normal work of breathing, clear to auscultation bilaterally with no crackles, wheezes, or rhonchi     Loreley Schwall M. Jerline Pain, MD 06/22/2018 4:39 PM

## 2018-06-22 NOTE — Patient Instructions (Addendum)
Start the atrovent and augmentin.   We will give you an injection of an anti-inflammatory steroid today.   Please stay well hydrated.  You can take tylenol and/or motrin as needed for low grade fever and pain.  Please let me know if your symptoms worsen or fail to improve.  Take care, Dr Jerline Pain

## 2018-09-03 ENCOUNTER — Encounter: Payer: BLUE CROSS/BLUE SHIELD | Admitting: Family Medicine

## 2018-09-28 ENCOUNTER — Encounter: Payer: Self-pay | Admitting: Family Medicine

## 2018-09-28 ENCOUNTER — Ambulatory Visit (INDEPENDENT_AMBULATORY_CARE_PROVIDER_SITE_OTHER): Payer: BLUE CROSS/BLUE SHIELD | Admitting: Family Medicine

## 2018-09-28 ENCOUNTER — Telehealth: Payer: Self-pay | Admitting: Family Medicine

## 2018-09-28 VITALS — Ht 70.0 in | Wt 270.0 lb

## 2018-09-28 DIAGNOSIS — Z20828 Contact with and (suspected) exposure to other viral communicable diseases: Secondary | ICD-10-CM | POA: Diagnosis not present

## 2018-09-28 DIAGNOSIS — Z20822 Contact with and (suspected) exposure to covid-19: Secondary | ICD-10-CM

## 2018-09-28 NOTE — Progress Notes (Signed)
Phone [phone]   Subjective:  Virtual visit via Video note. Chief complaint: Chief Complaint  Patient presents with  . Acute Visit    Discuss [phone] Testing   This visit type was conducted due to national recommendations for restrictions regarding the COVID-19 Pandemic (e.g. social distancing).  This format is felt to be most appropriate for this patient at this time balancing risks to patient and risks to population by having him in for in person visit.  No physical exam was performed (except for noted visual exam or audio findings with Telehealth visits).    Our team/I connected with [name] [name] at  2:20 PM EDT by a video enabled telemedicine application (doxy.me or caregility through epic) and verified that I am speaking with the correct person using two identifiers.  Location patient: [place] Location provider: [facility], office Persons participating in the virtual visit:  patient  Our team/I discussed the limitations of evaluation and management by telemedicine and the availability of in person appointments. In light of current covid-19 pandemic, patient also understands that we are trying to protect them by minimizing in office contact if at all possible.  The patient expressed consent for telemedicine visit and agreed to proceed. Patient understands insurance will be billed.   ROS- No fever, chills, cough, shortness of breath, body aches, sore throat, or loss of taste or smell  Past Medical History-  Patient Active Problem List   Diagnosis Date Noted  . Hx of adenomatous colonic polyps 10/19/2017    Priority: Medium  . HTN (hypertension) 05/09/2012    Priority: Medium  . Diverticulitis     Priority: Low  . Stress fracture 07/04/2014    Priority: Low  . Right-sided thoracic back pain 11/28/2013    Priority: Low  . Morbid obesity ([place]) 11/06/2017    Medications- reviewed and updated Current Outpatient Medications  Medication Sig Dispense Refill  . acetaminophen  (TYLENOL) 500 MG tablet Take 500 mg by mouth every 6 (six) hours as needed for pain.    Marland Kitchen amLODipine (NORVASC) 10 MG tablet TAKE 1 TABLET DAILY 90 tablet 3  . ibuprofen (ADVIL,MOTRIN) 200 MG tablet Take 200 mg by mouth every 6 (six) hours as needed.    Marland Kitchen olmesartan (BENICAR) 40 MG tablet TAKE 1 TABLET AT BEDTIME 90 tablet 3   No current facility-administered medications for this visit.      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   His last trip was after [place] trip- when he got back from trip he found out 7 people had been quarantined where he just came from. He had symptoms including bad head cold then chest congestion then later had some lower GI symptoms then it cleared up and that was it. Never had a fever, never short of breath with it. Illness was in February.  A/P: Patient with strong probability of exposure while in [place]- didn't meet testing parameters when sick in February but given what we  now know about asymptomatic spread and also fact may have been more prevalent in areas then we knew due to limited testing- we will have patient in for antibody testing. He is aware we are not sure if this confers immunity as well as not sure about accuracy or cost- he would still like to have the test done- will come by tomorrow for this.   Future Appointments  Date Time Provider [place]  09/29/2018  2:00 PM [facility]  01/26/2019  1:00 PM Marin Olp, MD [facility]   Lab/Order  associations: Exposure to Covid-19 Virus - Plan: SAR CoV2 Serology (COVID 19)AB(IGG)IA  Time Stamp The duration of face-to-face time during this visit was greater than 10 minutes. Greater than 50% of this time was spent in counseling, explanation of diagnosis, planning of further management, and/or coordination of care including  Discussing risks of covid exposure, potential signs symptoms, reviewing test and limitations.   Return precautions advised.  Garret Reddish, MD

## 2018-09-28 NOTE — Telephone Encounter (Signed)
Maddy, please contact patient to schedule virtual visit to discuss with Dr. Yong Channel

## 2018-09-28 NOTE — Telephone Encounter (Signed)
Copied from [place] [phone]. Topic: Quick Communication - See Telephone Encounter >> Sep 28, 2018 10:30 AM Nils Flack wrote: CRM for notification. See Telephone encounter for: 09/28/18. 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  He feels he has been exposed to corona.  He has done a lot of flying into [place] prior to March.  He would like to have antibody test for corona. He is not having any symptoms.  Please asvise. Cb is [phone]

## 2018-09-28 NOTE — Patient Instructions (Addendum)
There are no preventive care reminders to display for this patient.  Depression screen PHQ 2/9 07/28/2017  Decreased Interest 0  Down, Depressed, Hopeless 0  PHQ - 2 Score 0   Video visit

## 2018-09-29 ENCOUNTER — Other Ambulatory Visit (INDEPENDENT_AMBULATORY_CARE_PROVIDER_SITE_OTHER): Payer: BLUE CROSS/BLUE SHIELD

## 2018-09-29 DIAGNOSIS — Z20828 Contact with and (suspected) exposure to other viral communicable diseases: Secondary | ICD-10-CM | POA: Diagnosis not present

## 2018-09-29 DIAGNOSIS — Z20822 Contact with and (suspected) exposure to covid-19: Secondary | ICD-10-CM

## 2018-09-30 LAB — SAR COV2 SEROLOGY (COVID19)AB(IGG),IA: SARS CoV2 AB IGG: NEGATIVE

## 2018-10-27 ENCOUNTER — Telehealth: Payer: Self-pay | Admitting: Physical Therapy

## 2018-10-27 NOTE — Telephone Encounter (Signed)
Copied from [place] [id number]. Topic: Appointment Scheduling - Scheduling Inquiry for Clinic >> Oct 27, 2018  1:26 PM Rayann Heman wrote: Reason for CRM: pt called and would like to schedule an appointment for a cyst on back. Please advise

## 2018-10-30 ENCOUNTER — Ambulatory Visit: Payer: BLUE CROSS/BLUE SHIELD | Admitting: Family Medicine

## 2018-10-30 ENCOUNTER — Encounter: Payer: Self-pay | Admitting: Family Medicine

## 2018-10-30 ENCOUNTER — Other Ambulatory Visit: Payer: Self-pay

## 2018-10-30 VITALS — BP 110/68 | HR 89 | Temp 98.3°F | Ht 70.0 in

## 2018-10-30 DIAGNOSIS — L723 Sebaceous cyst: Secondary | ICD-10-CM | POA: Diagnosis not present

## 2018-10-30 DIAGNOSIS — I10 Essential (primary) hypertension: Secondary | ICD-10-CM

## 2018-10-30 NOTE — Progress Notes (Signed)
  Phone [phone]   Subjective:  [name] [name] is a 58 y.o. year old very pleasant male patient who presents for/with See problem oriented charting Chief Complaint  Patient presents with  . Cyst   ROS- No fever, chills, cough, shortness of breath, body aches, sore throat, or loss of taste or smell   Past Medical History-  Patient Active Problem List   Diagnosis Date Noted  . Hx of adenomatous colonic polyps 10/19/2017    Priority: Medium  . HTN (hypertension) 05/09/2012    Priority: Medium  . Diverticulitis     Priority: Low  . Stress fracture 07/04/2014    Priority: Low  . Right-sided thoracic back pain 11/28/2013    Priority: Low  . Morbid obesity ([place]) 11/06/2017    Medications- reviewed and updated Current Outpatient Medications  Medication Sig Dispense Refill  . acetaminophen (TYLENOL) 500 MG tablet Take 500 mg by mouth every 6 (six) hours as needed for pain.    Marland Kitchen amLODipine (NORVASC) 10 MG tablet TAKE 1 TABLET DAILY 90 tablet 3  . ibuprofen (ADVIL,MOTRIN) 200 MG tablet Take 200 mg by mouth every 6 (six) hours as needed.    Marland Kitchen olmesartan (BENICAR) 40 MG tablet TAKE 1 TABLET AT BEDTIME 90 tablet 3   No current facility-administered medications for this visit.      Objective:  BP 110/68 (BP Location: Left Arm, Patient Position: Sitting, Cuff Size: Large)   Pulse 89   Temp 98.3 F (36.8 C) (Oral)   Ht 5\' 10"  (1.778 m)   SpO2 97%   BMI 38.74 kg/m  Gen: NAD, resting comfortably CV: RRR  Lungs: nonlabored, normal respiratory rate Abdomen: soft/nondistende Skin: warm, dry, on right back below shoulder blade over 5 cm below- there is a bandaged area- when removed- note hole about 1-2 mm. Surrounding tissues palpated with cottage cheese like discharge expressed (no odor or obvious purulent material) as well as serosanguineous and bloody fluid    Assessment and Plan   # Cyst on Back S: Patient had a cyst incision and drainage due to infected/inflamed sebaceous  cyst on right back below shoulder blade back in October.  Per patient-cyst has returned. Not as large as the last time. Same location. There is some drainage and erythema. Denies fever.  No pain with at this time  On Tuesday night-turned over in bed and noted some wetness- had drainage from the cyst at that time on Tuesday night. Bandage on since Wednesday. Denies pain.  A/P: Appears to have sebaceous cyst on right low back- does not appear infected- we were able to express cottage cheeselike discharge without purulence as well as some serosanguineous and bloody fluid- patient would like to have cyst fully removed-referred to endocrinologyto day  #hypertension S: controlled on almodipine 10mg  and olmesartan 40mg .  BP Readings from Last 3 Encounters:  10/30/18 110/68  06/22/18 116/80  06/16/18 130/84  A/P:  Stable. Continue current medications.    Has upcoming physical- morbid obesity-he did not want to weigh in today but we did discuss working on weight loss with healthy eating regular exercise-this has been a challenge with covid-19 Future Appointments  Date Time Provider [place]  01/26/2019  1:00 PM Marin Olp, MD [facility]   Lab/Order associations:   ICD-10-CM   1. Sebaceous cyst  L72.3 Ambulatory referral to Dermatology  2. Morbid obesity (HCC) Chronic E66.01   3. Essential hypertension  I10    Return precautions advised.  Garret Reddish, MD

## 2018-10-30 NOTE — Patient Instructions (Addendum)
We will call you within two weeks about your referral to [facility] dermatology. If you do not hear within 3 weeks, give [place] a call.   Blood pressure looks good!

## 2019-01-26 ENCOUNTER — Encounter: Payer: BLUE CROSS/BLUE SHIELD | Admitting: Family Medicine

## 2019-04-05 ENCOUNTER — Encounter: Payer: BC Managed Care – PPO | Admitting: Family Medicine

## 2019-04-12 ENCOUNTER — Other Ambulatory Visit: Payer: Self-pay | Admitting: Family Medicine

## 2019-04-30 ENCOUNTER — Other Ambulatory Visit: Payer: Self-pay | Admitting: Family Medicine

## 2019-05-03 ENCOUNTER — Other Ambulatory Visit: Payer: Self-pay | Admitting: Family Medicine

## 2019-05-03 MED ORDER — OLMESARTAN MEDOXOMIL 40 MG PO TABS: 40.0000 mg | ORAL_TABLET | Freq: Every day | ORAL | 0 refills | Status: DC

## 2019-05-03 MED ORDER — AMLODIPINE BESYLATE 10 MG PO TABS: 10.0000 mg | ORAL_TABLET | Freq: Every day | ORAL | 0 refills | Status: DC

## 2019-05-03 NOTE — Telephone Encounter (Signed)
See note

## 2019-05-03 NOTE — Telephone Encounter (Signed)
Requested medication (s) are due for refill today: yes  Requested medication (s) are on the active medication list: yes  Last refill: omesartan 05/05/2018, amlodipine 04/12/2019  Future visit scheduled: No refill states need OV for further refill (amlodipine)  Notes to clinic:  Need OV    Requested Prescriptions  Pending Prescriptions Disp Refills   amLODipine (NORVASC) 10 MG tablet 30 tablet 0    Sig: Take 1 tablet (10 mg total) by mouth daily.      Cardiovascular:  Calcium Channel Blockers Failed - 05/03/2019 12:35 PM      Failed - Valid encounter within last 6 months    Recent Outpatient Visits           6 months ago Sebaceous cyst   [place] Hunter, Brayton Mars, MD   7 months ago Exposure to [place] Hunter, Brayton Mars, MD   10 months ago Sinusitis, unspecified chronicity, unspecified location   [place] Parker, Algis Greenhouse, MD   10 months ago Upper respiratory tract infection, unspecified type   [place] Worley, [place], [place]   1 year ago Essential hypertension   [place], [place]              Passed - Last BP in normal range    BP Readings from Last 1 Encounters:  10/30/18 110/68            olmesartan (BENICAR) 40 MG tablet 90 tablet 3    Sig: Take 1 tablet (40 mg total) by mouth at bedtime.      Cardiovascular:  Angiotensin Receptor Blockers Failed - 05/03/2019 12:35 PM      Failed - Cr in normal range and within 180 days    Creatinine  Date Value Ref Range Status  10/31/2017 0.8 0.6 - 1.3 Final   Creatinine, Ser  Date Value Ref Range Status  07/08/2017 0.93 0.40 - 1.50 mg/dL Final          Failed - K in normal range and within 180 days    Potassium  Date Value Ref Range Status  10/31/2017 4.5 3.4 - 5.3 Final          Failed - Valid encounter within last 6 months    Recent Outpatient  Visits           6 months ago Sebaceous cyst   [place] Hunter, Brayton Mars, MD   7 months ago Exposure to [place] Hunter, Brayton Mars, MD   10 months ago Sinusitis, unspecified chronicity, unspecified location   [place] Parker, Algis Greenhouse, MD   10 months ago Upper respiratory tract infection, unspecified type   [place] Worley, [place], [place]   1 year ago Essential hypertension   [place] Hunter, Brayton Mars, MD              Passed - Patient is not pregnant      Passed - Last BP in normal range    BP Readings from Last 1 Encounters:  10/30/18 110/68

## 2019-05-03 NOTE — Telephone Encounter (Signed)
Medication Refill - Medication: amLODipine (NORVASC) 10 MG tablet, olmesartan (BENICAR) 40 MG tablet Has the patient contacted their pharmacy? Yes.   (Agent: If no, request that the patient contact the pharmacy for the refill.) (Agent: If yes, when and what did the pharmacy advise?)  Preferred Pharmacy (with phone number or street name):  [place], [place] Phone:  [phone]  Fax:  [phone]    PATIENT STATED HE ONLY HAD 4 PILLS LEFT OF THE [facility]. HE WOULD LIKE A REFILL SENT TO  [facility] - [address], [address] AT [address] Phone:  [phone]  Fax:  [phone]     HE STATED HE WOULDN'T GET THE ONE FROM [facility] IN TIME.  Agent: Please be advised that RX refills may take up to 3 business days. We ask that you follow-up with your pharmacy.

## 2019-06-02 ENCOUNTER — Other Ambulatory Visit: Payer: Self-pay | Admitting: Family Medicine

## 2019-08-26 ENCOUNTER — Other Ambulatory Visit: Payer: Self-pay | Admitting: Family Medicine

## 2019-09-24 ENCOUNTER — Other Ambulatory Visit: Payer: Self-pay | Admitting: Family Medicine

## 2019-11-01 ENCOUNTER — Other Ambulatory Visit: Payer: Self-pay | Admitting: Family Medicine

## 2019-12-03 ENCOUNTER — Other Ambulatory Visit: Payer: Self-pay | Admitting: Family Medicine

## 2019-12-20 NOTE — Progress Notes (Signed)
Phone [phone] In person visit   Subjective:   [name] [name] is a 59 y.o. year old very pleasant male patient who presents for/with See problem oriented charting Chief Complaint  Patient presents with  . Follow-up   This visit occurred during the SARS-CoV-2 public health emergency.  Safety protocols were in place, including screening questions prior to the visit, additional usage of staff PPE, and extensive cleaning of exam room while observing appropriate contact time as indicated for disinfecting solutions.   Past Medical History-  Patient Active Problem List   Diagnosis Date Noted  . Hx of adenomatous colonic polyps 10/19/2017    Priority: Medium  . HTN (hypertension) 05/09/2012    Priority: Medium  . Diverticulitis     Priority: Low  . Stress fracture 07/04/2014    Priority: Low  . Right-sided thoracic back pain 11/28/2013    Priority: Low  . Depression, major, single episode, moderate ([place]) 12/21/2019  . Morbid obesity ([place]) 11/06/2017    Medications- reviewed and updated Current Outpatient Medications  Medication Sig Dispense Refill  . acetaminophen (TYLENOL) 500 MG tablet Take 500 mg by mouth every 6 (six) hours as needed for pain.    Marland Kitchen amLODipine (NORVASC) 10 MG tablet TAKE 1 TABLET(10 MG) BY MOUTH DAILY 30 tablet 5  . ibuprofen (ADVIL,MOTRIN) 200 MG tablet Take 200 mg by mouth every 6 (six) hours as needed.    Marland Kitchen olmesartan (BENICAR) 40 MG tablet TAKE 1 TABLET(40 MG) BY MOUTH DAILY 30 tablet 5  . buPROPion (WELLBUTRIN XL) 150 MG 24 hr tablet Take 1 tablet (150 mg total) by mouth daily. 30 tablet 5   No current facility-administered medications for this visit.     Objective:  BP 130/86   Pulse (!) 104   Temp 98.5 F (36.9 C)   Wt (!) 309 lb 9.6 oz (140.4 kg)   SpO2 96%   BMI 44.42 kg/m  Gen: NAD, resting comfortably    Assessment and Plan   # Depression S: Medication:pt states things have been terrible for him in the last year and a half, he feels  he may have some depression symptoms. Pt states he has noticed an increase in his alcohol consumption- currently - 3 nights a week does bottle of wine. . He has also started smoking again and he would like Chantix because he knows its stress smoking. Does not feel like stress eater but has gained weight- up 39 lbs.   Dad died a year ago in 12-25-22. And step mom is declining as well- they were married when he was 4.    Counselor years ago in 1995-1996 with bad breakup. Has done hospice grief counseling  Was on paxil years ago- had to take a half tablet as did not feel well on full talet.  Depression screen [facility] 2/9 12/21/2019 10/30/2018 07/28/2017  Decreased Interest 3 0 0  Down, Depressed, Hopeless 3 0 0  PHQ - 2 Score 6 0 0  Altered sleeping 0 1 -  Tired, decreased energy 3 0 -  Change in appetite 0 0 -  Feeling bad or failure about yourself  3 0 -  Trouble concentrating 3 0 -  Moving slowly or fidgety/restless 0 0 -  Suicidal thoughts 1 0 -  PHQ-9 Score 16 1 -  Difficult doing work/chores Very difficult Not difficult at all -  Fleeting thoughts of being better off dead A/P: Patient with recurrent depression-last treated in the 1990s after a significant life stressor.  Would consider this moderate with PHQ-9 at 16.  Recommended counseling and medication together. -He would like a male therapist-I think Mirna Mires would be an excellent choice -We discussed multiple treatment options-he did not do well with high-dose Paxil in the past -Since he also desires to quit smoking and has had issues with weight gain I think Wellbutrin would be an excellent choice -Recommended cutting down alcohol to 2 max per day "off of the 1 -Follow-up in 6 weeks-sooner if needed -Would be willing to chat with him by phone about possibly trying alternate therapy such as Lexapro if does not tolerate Wellbutrin  #hypertension S: medication:  amlodipine 10mg , olmesartan 40mg  BP Readings from Last 3 Encounters:   12/21/19 130/86  10/30/18 110/68  06/22/18 116/80  A/P: Improved control on repeat-continue current medication  Recommended follow up: Return in about 6 weeks (around 02/01/2020) for follow up- or sooner if needed.  Lab/Order associations:   ICD-10-CM   1. Depression, major, recurrent, moderate (HCC)  F33.1 CBC With Differential/Platelet    COMPLETE METABOLIC PANEL WITH GFR    TSH(Reflex)    TSH(Reflex)    COMPLETE METABOLIC PANEL WITH GFR    CBC With Differential/Platelet  2. Essential hypertension  I10 CBC With Differential/Platelet    COMPLETE METABOLIC PANEL WITH GFR    TSH(Reflex)    TSH(Reflex)    COMPLETE METABOLIC PANEL WITH GFR    CBC With Differential/Platelet    Meds ordered this encounter  Medications  . olmesartan (BENICAR) 40 MG tablet    Sig: TAKE 1 TABLET(40 MG) BY MOUTH DAILY    Dispense:  30 tablet    Refill:  5  . amLODipine (NORVASC) 10 MG tablet    Sig: TAKE 1 TABLET(10 MG) BY MOUTH DAILY    Dispense:  30 tablet    Refill:  5  . buPROPion (WELLBUTRIN XL) 150 MG 24 hr tablet    Sig: Take 1 tablet (150 mg total) by mouth daily.    Dispense:  30 tablet    Refill:  5    Time Spent: 41 Minutes of total time (2:24 PM- 3:05 PM) was spent on the date of the encounter performing the following actions: chart review prior to seeing the patient, obtaining history, performing a medically necessary exam, counseling on the treatment plan, placing orders, and documenting in our EHR.   Return precautions advised.  Garret Reddish, MD

## 2019-12-20 NOTE — Patient Instructions (Addendum)
Health Maintenance Due  Topic Date Due  . INFLUENZA VACCINE -let us know when you get this in the fall if not here. We should have available in 1-2 months perhaps at next visit 12/12/2019    Please call [phone] to schedule a visit with [facility] -Trey Paula is an excellent counselor who is based out of our clinic  Start wellbutrin/bupropion- may also help you quit smoking  Taking the medicine as directed and not missing any doses is one of the best things you can do to treat your depression.  Here are some things to keep in mind:  1) Side effects (stomach upset, some increased anxiety) may happen before you notice a benefit.  These side effects typically go away over time. 2) Changes to your dose of medicine or a change in medication all together is sometimes necessary 3) Most people need to be on medication at least 6-12 months. With your history- may be worth using longer- we will play by ear 4) Many people will notice an improvement within two weeks but the full effect of the medication can take up to 4-6 weeks 5) Stopping the medication when you start feeling better often results in a return of symptoms 6) If you start having progressive thoughts of hurting yourself or others after starting this medicine, call our office immediately at [phone] or seek care through 911.    Please stop by lab before you go If you have mychart- we will send your results within 3 business days of [place] receiving them.  If you do not have mychart- we will call you about results within 5 business days of [place] receiving them.  *please note we are currently using [facility] which has a longer processing time than [facility] typically so labs may not come back as quickly as in the past *please also note that you will see labs on mychart as soon as they post. I will later go in and write notes on them- will say "notes from Dr. Yong Channel"

## 2019-12-21 ENCOUNTER — Other Ambulatory Visit: Payer: Self-pay

## 2019-12-21 ENCOUNTER — Ambulatory Visit: Payer: BC Managed Care – PPO | Admitting: Family Medicine

## 2019-12-21 ENCOUNTER — Encounter: Payer: Self-pay | Admitting: Family Medicine

## 2019-12-21 VITALS — BP 130/86 | HR 104 | Temp 98.5°F | Wt 309.6 lb

## 2019-12-21 DIAGNOSIS — I10 Essential (primary) hypertension: Secondary | ICD-10-CM | POA: Diagnosis not present

## 2019-12-21 DIAGNOSIS — F331 Major depressive disorder, recurrent, moderate: Secondary | ICD-10-CM

## 2019-12-21 DIAGNOSIS — F321 Major depressive disorder, single episode, moderate: Secondary | ICD-10-CM | POA: Insufficient documentation

## 2019-12-21 MED ORDER — OLMESARTAN MEDOXOMIL 40 MG PO TABS: ORAL_TABLET | ORAL | 5 refills | Status: DC

## 2019-12-21 MED ORDER — AMLODIPINE BESYLATE 10 MG PO TABS: ORAL_TABLET | ORAL | 5 refills | Status: DC

## 2019-12-21 MED ORDER — BUPROPION HCL ER (XL) 150 MG PO TB24: 150.0000 mg | ORAL_TABLET | Freq: Every day | ORAL | 5 refills | Status: DC

## 2019-12-22 ENCOUNTER — Other Ambulatory Visit: Payer: Self-pay

## 2019-12-22 DIAGNOSIS — E875 Hyperkalemia: Secondary | ICD-10-CM

## 2019-12-22 LAB — CBC WITH DIFFERENTIAL/PLATELET
Absolute Monocytes: 788 cells/uL (ref 200–950)
Basophils Absolute: 43 cells/uL (ref 0–200)
Basophils Relative: 0.6 %
Eosinophils Absolute: 71 cells/uL (ref 15–500)
Eosinophils Relative: 1 %
HCT: 46.8 % (ref 38.5–50.0)
Hemoglobin: 16.2 g/dL (ref 13.2–17.1)
Lymphs Abs: 1207 cells/uL (ref 850–3900)
MCH: 34 pg — ABNORMAL HIGH (ref 27.0–33.0)
MCHC: 34.6 g/dL (ref 32.0–36.0)
MCV: 98.1 fL (ref 80.0–100.0)
MPV: 8.8 fL (ref 7.5–12.5)
Monocytes Relative: 11.1 %
Neutro Abs: 4991 cells/uL (ref 1500–7800)
Neutrophils Relative %: 70.3 %
Platelets: 258 10*3/uL (ref 140–400)
RBC: 4.77 10*6/uL (ref 4.20–5.80)
RDW: 12.2 % (ref 11.0–15.0)
Total Lymphocyte: 17 %
WBC: 7.1 10*3/uL (ref 3.8–10.8)

## 2019-12-22 LAB — TSH(REFL): TSH: 1.1 mIU/L (ref 0.40–4.50)

## 2019-12-22 LAB — COMPLETE METABOLIC PANEL WITH GFR
AG Ratio: 1.5 (calc) (ref 1.0–2.5)
ALT: 28 U/L (ref 9–46)
AST: 21 U/L (ref 10–35)
Albumin: 4.5 g/dL (ref 3.6–5.1)
Alkaline phosphatase (APISO): 61 U/L (ref 35–144)
BUN: 16 mg/dL (ref 7–25)
CO2: 26 mmol/L (ref 20–32)
Calcium: 9.9 mg/dL (ref 8.6–10.3)
Chloride: 103 mmol/L (ref 98–110)
Creat: 1.09 mg/dL (ref 0.70–1.25)
GFR, Est African American: 84 mL/min/{1.73_m2} (ref >=60)
GFR, Est Non African American: 73 mL/min/{1.73_m2} (ref >=60)
Globulin: 3.1 g/dL (calc) (ref 1.9–3.7)
Glucose, Bld: 118 mg/dL — ABNORMAL HIGH (ref 65–99)
Potassium: 5.4 mmol/L — ABNORMAL HIGH (ref 3.5–5.3)
Sodium: 139 mmol/L (ref 135–146)
Total Bilirubin: 0.5 mg/dL (ref 0.2–1.2)
Total Protein: 7.6 g/dL (ref 6.1–8.1)

## 2019-12-22 LAB — REFLEX TIQ

## 2019-12-23 ENCOUNTER — Other Ambulatory Visit: Payer: Self-pay

## 2019-12-23 DIAGNOSIS — E875 Hyperkalemia: Secondary | ICD-10-CM

## 2019-12-29 ENCOUNTER — Other Ambulatory Visit: Payer: Self-pay

## 2019-12-30 ENCOUNTER — Other Ambulatory Visit: Payer: Self-pay

## 2020-01-03 ENCOUNTER — Other Ambulatory Visit: Payer: BC Managed Care – PPO

## 2020-01-03 ENCOUNTER — Other Ambulatory Visit: Payer: Self-pay

## 2020-01-03 DIAGNOSIS — E875 Hyperkalemia: Secondary | ICD-10-CM

## 2020-01-03 NOTE — Addendum Note (Signed)
Addended by: Liliane Channel on: 01/03/2020 02:02 PM   Modules accepted: Orders

## 2020-01-04 LAB — BASIC METABOLIC PANEL
BUN: 20 mg/dL (ref 7–25)
CO2: 24 mmol/L (ref 20–32)
Calcium: 9.6 mg/dL (ref 8.6–10.3)
Chloride: 103 mmol/L (ref 98–110)
Creat: 1.03 mg/dL (ref 0.70–1.25)
Glucose, Bld: 114 mg/dL — ABNORMAL HIGH (ref 65–99)
Potassium: 4.5 mmol/L (ref 3.5–5.3)
Sodium: 138 mmol/L (ref 135–146)

## 2020-02-23 ENCOUNTER — Encounter: Payer: Self-pay | Admitting: Family Medicine

## 2020-02-23 ENCOUNTER — Ambulatory Visit: Payer: BC Managed Care – PPO | Admitting: Family Medicine

## 2020-02-23 ENCOUNTER — Other Ambulatory Visit: Payer: Self-pay

## 2020-02-23 VITALS — BP 118/78 | HR 95 | Temp 98.1°F | Resp 18 | Ht 70.0 in

## 2020-02-23 DIAGNOSIS — F324 Major depressive disorder, single episode, in partial remission: Secondary | ICD-10-CM

## 2020-02-23 DIAGNOSIS — Z23 Encounter for immunization: Secondary | ICD-10-CM

## 2020-02-23 DIAGNOSIS — H6122 Impacted cerumen, left ear: Secondary | ICD-10-CM

## 2020-02-23 DIAGNOSIS — R21 Rash and other nonspecific skin eruption: Secondary | ICD-10-CM

## 2020-02-23 MED ORDER — TRIAMCINOLONE ACETONIDE 0.1 % EX CREA
1.0000 | TOPICAL_CREAM | Freq: Two times a day (BID) | CUTANEOUS | 0 refills | Status: DC
Start: 2020-02-23 — End: 2021-09-04

## 2020-02-23 MED ORDER — FLUTICASONE PROPIONATE 50 MCG/ACT NA SUSP
2.0000 | Freq: Every day | NASAL | 2 refills | Status: DC
Start: 2020-02-23 — End: 2021-03-16

## 2020-02-23 NOTE — Progress Notes (Signed)
Phone [phone] In person visit   Subjective:   [name] [name] is a 59 y.o. year old very pleasant male patient who presents for/with See problem oriented charting Chief Complaint  Patient presents with  . Left Ear Drum Pain   This visit occurred during the SARS-CoV-2 public health emergency.  Safety protocols were in place, including screening questions prior to the visit, additional usage of staff PPE, and extensive cleaning of exam room while observing appropriate contact time as indicated for disinfecting solutions.   Past Medical History-  Patient Active Problem List   Diagnosis Date Noted  . Hx of adenomatous colonic polyps 10/19/2017    Priority: Medium  . HTN (hypertension) 05/09/2012    Priority: Medium  . Diverticulitis     Priority: Low  . Stress fracture 07/04/2014    Priority: Low  . Right-sided thoracic back pain 11/28/2013    Priority: Low  . Depression, major, single episode, moderate ([place]) 12/21/2019  . Morbid obesity ([place]) 11/06/2017    Medications- reviewed and updated Current Outpatient Medications  Medication Sig Dispense Refill  . acetaminophen (TYLENOL) 500 MG tablet Take 500 mg by mouth every 6 (six) hours as needed for pain.    Marland Kitchen amLODipine (NORVASC) 10 MG tablet TAKE 1 TABLET(10 MG) BY MOUTH DAILY 30 tablet 5  . buPROPion (WELLBUTRIN XL) 150 MG 24 hr tablet Take 1 tablet (150 mg total) by mouth daily. 30 tablet 5  . ibuprofen (ADVIL,MOTRIN) 200 MG tablet Take 200 mg by mouth every 6 (six) hours as needed.    Marland Kitchen olmesartan (BENICAR) 40 MG tablet TAKE 1 TABLET(40 MG) BY MOUTH DAILY 30 tablet 5  . fluticasone (FLONASE) 50 MCG/ACT nasal spray Place 2 sprays into both nostrils daily. 16 g 2  . triamcinolone cream (KENALOG) 0.1 % Apply 1 application topically 2 (two) times daily. For 7-10 days maximum 80 g 0   No current facility-administered medications for this visit.     Objective:  BP 118/78   Pulse 95   Temp 98.1 F (36.7 C) (Temporal)    Resp 18   Ht 5\' 10"  (1.778 m)   SpO2 99%   BMI 44.42 kg/m  Gen: NAD, resting comfortably Tympanic membrane completely obscured by wax on left ear.  Some wax on right ear but not completely obstructed.  After irrigation-full tympanic membrane was viewed and largely unremarkable though cannot rule out effusion.  Canal appears mildly erythematous where wax was removed.  Patient reports resolution of fullness sensation and improved hearing after procedure  Patient declined being weighed today  Procedure note: Verbal consent obtained- discussed risk of tympanic membrane perforation Obstructive copious Cerumen noted in left ear. This obscures evaluation of tympanic membrane.  Irrigation with water and peroxide performed. Full view of tympanic membrane after procedure.  Patient tolerated procedure well     Assessment and Plan  # Left Ear fullness/hearing loss  S:Patient mentioned that the pain started over last week.  He stated that he was able to get some wax out of his left ear- q tip and advised against. He has not used any ear drops. Also notes some jaw pain and perhaps pain behind the eye. Has noted hearing loss. auto insufflation helps some. A/P: Some of patient's symptoms were related to certain impaction.  This was completely removed today.  He also reports some allergies since the fall has begun and some mild congestion symptoms-with improvement with auto insufflation I wonder if he has some otitis media with effusion/eustachian  tube dysfunction-a course of Flonase is reasonable as below.  ENT referral if fails to continue to improve  From AVS:  " Thankfully [place] was able to remove a substantial amount of wax from your left ear canal-glad your hearing and fullness sensation is better.  It also seems like he may have some allergies which could be contributing to eustachian tube dysfunction-I think if we can calm down some of the inflammation with Flonase in your nasal passages that could  potentially help.  If you are not feeling better in the next 2 or 3 weeks let us know and we can refer to ENT or certainly sooner if symptoms worsen.  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 Recommended visits with Trey Paula for counseling.   Feels like he is improving but is not fully better at this point.  Has been doing some reading on depression and redirection.  Depression screen [facility] 2/9 02/23/2020 12/21/2019 10/30/2018  Decreased Interest 1 3 0  Down, Depressed, Hopeless 1 3 0  PHQ - 2 Score 2 6 0  Altered sleeping 0 0 1  Tired, decreased energy 3 3 0  Change in appetite 0 0 0  Feeling bad or failure about yourself  0 3 0  Trouble concentrating 0 3 0  Moving slowly or fidgety/restless 0 0 0  Suicidal thoughts 0 1 0  PHQ-9 Score 5 16 1   Difficult doing work/chores Somewhat difficult Very difficult Not difficult at all  A/P: Depression still with mild poor control-would prefer score for a less and also less anhedonia and depressed mood.  We discussed possibly increasing his dose of medication.  Since he has made improvement he would like to continue on his current dose and see how he does over the next few months-we will reschedule her 1 month follow-up for 3 months  #Rash S:1 month of rash at base of neck on both sides. In intertriginous area. Band like red rash. Has tried witch hazel, rubbing alcohol. Has not tried steroid cream like hydrocortisone.  ROS-not ill appearing, no fever/chills. No new medications. Not immunocompromised. No mucus membrane involvement.  A/P: this looks like intertrigo- not clearly fungal- will start with steroid cream-if this worsens rash he will stop immediately and let me know and we will try an antifungal.  If no improvement after a week or 10 days would also try antifungal.  If both of these methods fail consider dermatology consult   From AVS:  " For the rash on your neck-tried triamcinolone twice  a day for 10 days-if not improving follow-up with dermatology.  If it worsens please stop the triamcinolone and let me know as I want to use an antifungal instead-though it does not have the classic appearance of fungus-I think it is primarily irritation where your skin is rubbing together.  Also if possible try to keep this area patted dry but I would not rub across it. Weight loss may also help- I know you are working on that- we can weigh at next visit  "  #Morbid obesity likely contributes to neck rash-see discussion above.  Would recommend healthy eating/regular exercise  Recommended follow up: Return in about 3 months (around 05/25/2020) for follow up- or sooner if needed.  Could be done as a physical Future Appointments  Date Time Provider [place]  05/30/2020  3:00 PM Marin Olp, MD [facility]    Lab/Order associations:   ICD-10-CM   1.  Major depressive disorder in partial remission, unspecified whether recurrent ([place])  F32.4   2. Impacted cerumen of left ear  H61.22   3. Rash of neck  R21     Meds ordered this encounter  Medications  . triamcinolone cream (KENALOG) 0.1 %    Sig: Apply 1 application topically 2 (two) times daily. For 7-10 days maximum    Dispense:  80 g    Refill:  0  . fluticasone (FLONASE) 50 MCG/ACT nasal spray    Sig: Place 2 sprays into both nostrils daily.    Dispense:  16 g    Refill:  2   Time Spent: 32 minutes of total time was spent on the date of the encounter performing the following actions: chart review prior to seeing the patient, obtaining history, performing a medically necessary exam, counseling on the treatment plan, placing orders, and documenting in our EHR.   Return precautions advised.  Garret Reddish, MD

## 2020-02-23 NOTE — Patient Instructions (Addendum)
Health Maintenance Due  Topic Date Due   INFLUENZA VACCINE In office flu shot today. 12/12/2019   I am glad the Wellbutrin has been helpful-lets continue current medication for now.  Reschedule your visit next month for perhaps 3 months from now  [place] was able to remove a substantial amount of wax from your left ear canal-glad your hearing and fullness sensation is better.  It also seems like he may have some allergies which could be contributing to eustachian tube dysfunction-I think if we can calm down some of the inflammation with Flonase in your nasal passages that could potentially help.  If you are not feeling better in the next 2 or 3 weeks let us know and we can refer to ENT or certainly sooner if symptoms worsen.  For the rash on your neck-tried triamcinolone twice a day for 10 days-if not improving follow-up with dermatology.  If it worsens please stop the triamcinolone and let me know as I want to use an antifungal instead-though it does not have the classic appearance of fungus-I think it is primarily irritation where your skin is rubbing together.  Also if possible try to keep this area patted dry but I would not rub across it. Weight loss may also help- I know you are working on that- we can weigh at next visit  Recommended follow up: Return in about 3 months (around 05/25/2020) for follow up- or sooner if needed. You could try to schedule this as a physical as I believe it has been over a year- double check with desk

## 2020-03-21 ENCOUNTER — Ambulatory Visit: Payer: BC Managed Care – PPO | Admitting: Family Medicine

## 2020-05-30 ENCOUNTER — Ambulatory Visit: Payer: BC Managed Care – PPO | Admitting: Family Medicine

## 2020-07-17 ENCOUNTER — Other Ambulatory Visit: Payer: Self-pay | Admitting: Family Medicine

## 2020-07-25 ENCOUNTER — Telehealth: Payer: Self-pay

## 2020-07-25 ENCOUNTER — Ambulatory Visit: Payer: BC Managed Care – PPO | Admitting: Physician Assistant

## 2020-07-25 ENCOUNTER — Other Ambulatory Visit: Payer: Self-pay

## 2020-07-25 ENCOUNTER — Encounter: Payer: Self-pay | Admitting: Physician Assistant

## 2020-07-25 ENCOUNTER — Emergency Department (HOSPITAL_BASED_OUTPATIENT_CLINIC_OR_DEPARTMENT_OTHER): Payer: BC Managed Care – PPO | Admitting: Radiology

## 2020-07-25 ENCOUNTER — Emergency Department (HOSPITAL_BASED_OUTPATIENT_CLINIC_OR_DEPARTMENT_OTHER)
Admission: EM | Admit: 2020-07-25 | Discharge: 2020-07-25 | Disposition: A | Discharge status: Home / Self Care | Payer: BC Managed Care – PPO | Attending: Emergency Medicine | Admitting: Emergency Medicine

## 2020-07-25 VITALS — BP 110/72 | HR 100 | Temp 98.1°F | Ht 70.0 in | Wt 305.5 lb

## 2020-07-25 DIAGNOSIS — Z79899 Other long term (current) drug therapy: Secondary | ICD-10-CM | POA: Insufficient documentation

## 2020-07-25 DIAGNOSIS — R194 Change in bowel habit: Secondary | ICD-10-CM

## 2020-07-25 DIAGNOSIS — K5900 Constipation, unspecified: Secondary | ICD-10-CM | POA: Insufficient documentation

## 2020-07-25 DIAGNOSIS — Z720 Tobacco use: Secondary | ICD-10-CM | POA: Diagnosis not present

## 2020-07-25 DIAGNOSIS — I10 Essential (primary) hypertension: Secondary | ICD-10-CM | POA: Diagnosis not present

## 2020-07-25 DIAGNOSIS — M545 Low back pain, unspecified: Secondary | ICD-10-CM

## 2020-07-25 LAB — CBC WITH DIFFERENTIAL/PLATELET
Basophils Absolute: 0 10*3/uL (ref 0.0–0.1)
Basophils Relative: 0.5 % (ref 0.0–3.0)
Eosinophils Absolute: 0.2 10*3/uL (ref 0.0–0.7)
Eosinophils Relative: 2.3 % (ref 0.0–5.0)
HCT: 45.2 % (ref 39.0–52.0)
Hemoglobin: 16.1 g/dL (ref 13.0–17.0)
Lymphocytes Relative: 26.1 % (ref 12.0–46.0)
Lymphs Abs: 1.8 10*3/uL (ref 0.7–4.0)
MCHC: 35.6 g/dL (ref 30.0–36.0)
MCV: 96.9 fl (ref 78.0–100.0)
Monocytes Absolute: 0.7 10*3/uL (ref 0.1–1.0)
Monocytes Relative: 10.7 % (ref 3.0–12.0)
Neutro Abs: 4.2 10*3/uL (ref 1.4–7.7)
Neutrophils Relative %: 60.4 % (ref 43.0–77.0)
Platelets: 329 10*3/uL (ref 150.0–400.0)
RBC: 4.66 Mil/uL (ref 4.22–5.81)
RDW: 12.3 % (ref 11.5–15.5)
WBC: 6.9 10*3/uL (ref 4.0–10.5)

## 2020-07-25 LAB — COMPREHENSIVE METABOLIC PANEL
ALT: 55 U/L — ABNORMAL HIGH (ref 0–53)
AST: 33 U/L (ref 0–37)
Albumin: 4.3 g/dL (ref 3.5–5.2)
Alkaline Phosphatase: 53 U/L (ref 39–117)
BUN: 16 mg/dL (ref 6–23)
CO2: 23 mEq/L (ref 19–32)
Calcium: 9.5 mg/dL (ref 8.4–10.5)
Chloride: 105 mEq/L (ref 96–112)
Creatinine, Ser: 1 mg/dL (ref 0.40–1.50)
GFR: 81.02 mL/min (ref >60.00)
Glucose, Bld: 100 mg/dL — ABNORMAL HIGH (ref 70–99)
Potassium: 4.4 mEq/L (ref 3.5–5.1)
Sodium: 138 mEq/L (ref 135–145)
Total Bilirubin: 0.5 mg/dL (ref 0.2–1.2)
Total Protein: 7.3 g/dL (ref 6.0–8.3)

## 2020-07-25 LAB — LIPASE: Lipase: 37 U/L (ref 11.0–59.0)

## 2020-07-25 NOTE — Patient Instructions (Signed)
It was great to see you!  Update blood work and urine today. Make the changes we discussed on your handout.  If you cannot provider urine sample, come back this afternoon.  Any worsening, we will get CT scan. Let me know.  Take care,  Inda Coke PA-C

## 2020-07-25 NOTE — ED Notes (Signed)
Patient transported to X-ray 

## 2020-07-25 NOTE — Progress Notes (Signed)
[name] [name] is a 60 y.o. male here for a new problem.  I acted as a Education administrator for [employer], PA-C Anselmo Pickler, LPN   History of Present Illness:   Chief Complaint  Patient presents with  . Constipation    HPI  Bowel changes; back pain Pt c/o trouble with constipation x 1 week. Pt said he stopped drinking alcohol x 2 weeks ago for [place]. Was drinking approximately 1/2 - 1 bottle of wine nightly prior to [place]. He has noticed that he has been having intermittent hard and watery stools. He took Dulcolax x 3 tablets on Sunday and on Monday, with insignificant results. Having some mild nausea. Denies: headaches, abdominal pain, vomiting, hematuria, rectal bleeding, dysuria.  He does endorse worsening low back spasms. Taking Ibuprofen for low back pain. Has been able to do yard work and other things but is still having some spasms that have caused him to have to stop. Denies weakness in lower legs, swelling.  Prior to stopping alcohol he was having regular BMs. He does take mag citrate x 2 every night and has for years.   Last colonoscopy was 10/13/2017 and is due in 2022, had 6 adenomas.  Hx of colectomy due to recurrent diverticulitis in 09/2008. Does also have hx of nephrolithiasis.  Last abx use was about a month ago.    Past Medical History:  Diagnosis Date  . Allergy   . Diverticulitis    leading to approx 1 foot resection-colectomy  . History of kidney stones   . Hx of adenomatous colonic polyps 10/19/2017  . Hypertension   . NEPHROLITHIASIS, HX OF 03/09/2007  . PONV (postoperative nausea and vomiting)   . SPRAIN/STRAIN, ANKLE NOS 01/05/2007     Social History   Tobacco Use  . Smoking status: Former Smoker    Types: Cigarettes, E-cigarettes    Quit date: 05/13/2016    Years since quitting: 4.2  . Smokeless tobacco: Current User  . Tobacco comment: Doing Vape in the evenings  Vaping Use  . Vaping Use: Every day  Substance Use Topics  . Alcohol use: Yes     Alcohol/week: 0.0 standard drinks    Comment: occasionally  . Drug use: No    Past Surgical History:  Procedure Laterality Date  . COLECTOMY  09/2008   Dr Hoxworth  . COLONOSCOPY    . HERNIA REPAIR  09/18/12   lap incisional hernia repair, related to colectomy  . VENTRAL HERNIA REPAIR N/A 09/18/2012    Family History  Problem Relation Age of Onset  . CVA Mother        75  . Heart attack Father        72   . Sleep apnea Father        noncompliant with CPAP  . Pulmonary Hypertension Father        related to cpap noncompliance. died 38 sepsis   . Colon cancer Neg Hx   . Colon polyps Neg Hx   . Esophageal cancer Neg Hx   . Rectal cancer Neg Hx   . Stomach cancer Neg Hx     Allergies  Allergen Reactions  . Benadryl [Diphenhydramine Hcl] Itching    Patient stated allergy after ordered by MD  . Morphine And Related Itching    Current Medications:   Current Outpatient Medications:  .  acetaminophen (TYLENOL) 500 MG tablet, Take 500 mg by mouth every 6 (six) hours as needed for pain., Disp: , Rfl:  .  amLODipine (  NORVASC) 10 MG tablet, TAKE 1 TABLET(10 MG) BY MOUTH DAILY, Disp: 30 tablet, Rfl: 5 .  buPROPion (WELLBUTRIN XL) 150 MG 24 hr tablet, TAKE 1 TABLET(150 MG) BY MOUTH DAILY, Disp: 30 tablet, Rfl: 5 .  fluticasone (FLONASE) 50 MCG/ACT nasal spray, Place 2 sprays into both nostrils daily., Disp: 16 g, Rfl: 2 .  ibuprofen (ADVIL,MOTRIN) 200 MG tablet, Take 200 mg by mouth every 6 (six) hours as needed., Disp: , Rfl:  .  olmesartan (BENICAR) 40 MG tablet, TAKE 1 TABLET(40 MG) BY MOUTH DAILY, Disp: 30 tablet, Rfl: 5 .  triamcinolone cream (KENALOG) 0.1 %, Apply 1 application topically 2 (two) times daily. For 7-10 days maximum, Disp: 80 g, Rfl: 0   Review of Systems:   ROS Negative unless otherwise specified per HPI.  Vitals:   Vitals:   07/25/20 1130  BP: 110/72  Pulse: 100  Temp: 98.1 F (36.7 C)  TempSrc: Temporal  SpO2: 94%  Weight: (!) 305 lb 8 oz (138.6 kg)   Height: 5\' 10"  (1.778 m)     Body mass index is 43.83 kg/m.  Physical Exam:   Physical Exam Vitals and nursing note reviewed.  Constitutional:      General: He is not in acute distress.    Appearance: He is well-developed. He is not ill-appearing or toxic-appearing.  Cardiovascular:     Rate and Rhythm: Normal rate and regular rhythm.     Pulses: Normal pulses.     Heart sounds: Normal heart sounds, S1 normal and S2 normal.     Comments: No LE edema Pulmonary:     Effort: Pulmonary effort is normal.     Breath sounds: Normal breath sounds.  Abdominal:     General: Abdomen is flat. Bowel sounds are normal.     Palpations: Abdomen is soft.     Tenderness: There is no abdominal tenderness. There is no right CVA tenderness or left CVA tenderness.  Musculoskeletal:     Comments: TTP to bilateral lumbar muscles; no bony tenderness  Skin:    General: Skin is warm and dry.  Neurological:     Mental Status: He is alert.     GCS: GCS eye subscore is 4. GCS verbal subscore is 5. GCS motor subscore is 6.  Psychiatric:        Speech: Speech normal.        Behavior: Behavior normal. Behavior is cooperative.     Assessment and Plan:   [place] was seen today for constipation.  Diagnoses and all orders for this visit:  Bowel habit changes; Acute bilateral low back pain without sciatica He is in NAD today and vitals stable. No abdominal tenderness on my exam today. No evidence of acute abdomen. Labs thus far are unremarkable (CBC, CMP, lipase). DDx includes: constipation, stool changes related to recent ETOH reduction, IBS, diverticulitis, nephrolithiasis, back strain, among others. Recommended the following: -Decrease mag citrate to daily, may need to stop completely -Stop dulcolax -Start colace 100 mg BID -Start miralax 1 capful daily -Push plain water  If no improvement over next few days or any worsening, new symptoms, recommend CT abd/pel for further evaluation given GI  hx. -     CBC with Differential/Platelet -     Comprehensive metabolic panel -     POCT Urinalysis Dipstick (Automated) -     Urine Culture -     Lipase  CMA or LPN served as scribe during this visit. History, Physical, and Plan performed by medical  provider. The above documentation has been reviewed and is accurate and complete.  Inda Coke, PA-C

## 2020-07-25 NOTE — Telephone Encounter (Signed)
Pt has tried the options Aldona Bar gave him for his constipation and has experienced NO relief. He states his cramping is worsening.

## 2020-07-25 NOTE — ED Triage Notes (Addendum)
Pt from home states that he has had trouble with constipation off and on for the past few day. Last bowel movent  was yesterday and pt states that it was more watery than solid.  Pt took colace earlier this afternoon

## 2020-07-25 NOTE — Telephone Encounter (Signed)
Spoke to pt told him discussed with [name] [name] and she would like you to go to [facility] to be evaluated due to cramping is worse and it is too later for her to order any tests. Pt verbalized understanding. Gave pt address and explained how to get there. Pt verbalized understanding.

## 2020-07-25 NOTE — ED Provider Notes (Signed)
[facility] EMERGENCY DEPT Provider Note   CSN: [id number] Arrival date & time: 07/25/20  1716     History Chief Complaint  Patient presents with  . Constipation    [name] [name] is a 60 y.o. male.  Patient c/o constipation in the past week. Symptoms acute onset, moderate, constant, persistent. States at baseline has used mg citrate on a regular basis to help keep bms regular. A week ago did stop drinking alcohol - formerly 1/2-1 bottle/day. No other recent change in diet or meds. Denies fever or chills. No abd distension or nausea/vomiting. Did have small bm yesterday, and is passing gas. No prior hx sbo. Remote hx colon surgery for diverticular disease. Denies recent change in caliber of stools. No wt loss.  Is urinating without difficulty or gu symptoms. No back pain. No numbness/weakness.  Went to pcp w same today, had labs done.   The history is provided by the patient.  Constipation Associated symptoms: no abdominal pain, no back pain, no dysuria, no fever and no vomiting        Past Medical History:  Diagnosis Date  . Allergy   . Diverticulitis    leading to approx 1 foot resection-colectomy  . History of kidney stones   . Hx of adenomatous colonic polyps 10/19/2017  . Hypertension   . NEPHROLITHIASIS, HX OF 03/09/2007  . PONV (postoperative nausea and vomiting)   . SPRAIN/STRAIN, ANKLE NOS 01/05/2007    Patient Active Problem List   Diagnosis Date Noted  . Depression, major, single episode, moderate ([place]) 12/21/2019  . Morbid obesity ([place]) 11/06/2017  . Hx of adenomatous colonic polyps 10/19/2017  . Diverticulitis   . Stress fracture 07/04/2014  . Right-sided thoracic back pain 11/28/2013  . HTN (hypertension) 05/09/2012    Past Surgical History:  Procedure Laterality Date  . COLECTOMY  09/2008   Dr Excell Seltzer  . COLONOSCOPY    . HERNIA REPAIR  09/18/12   lap incisional hernia repair, related to colectomy  . VENTRAL HERNIA REPAIR N/A 09/18/2012        Family History  Problem Relation Age of Onset  . CVA Mother        1  . Heart attack Father        41  . Sleep apnea Father        noncompliant with CPAP  . Pulmonary Hypertension Father        related to cpap noncompliance. died 68 sepsis   . Colon cancer Neg Hx   . Colon polyps Neg Hx   . Esophageal cancer Neg Hx   . Rectal cancer Neg Hx   . Stomach cancer Neg Hx     Social History   Tobacco Use  . Smoking status: Former Smoker    Types: Cigarettes, E-cigarettes    Quit date: 05/13/2016    Years since quitting: 4.2  . Smokeless tobacco: Current User  . Tobacco comment: Doing Vape in the evenings  Vaping Use  . Vaping Use: Every day  Substance Use Topics  . Alcohol use: Yes    Alcohol/week: 0.0 standard drinks    Comment: occasionally  . Drug use: No    Home Medications Prior to Admission medications   Medication Sig Start Date End Date Taking? Authorizing Provider  acetaminophen (TYLENOL) 500 MG tablet Take 500 mg by mouth every 6 (six) hours as needed for pain.    [provider]  amLODipine (NORVASC) 10 MG tablet TAKE 1 TABLET(10 MG) BY MOUTH  DAILY 07/17/20   Marin Olp, MD  buPROPion (WELLBUTRIN XL) 150 MG 24 hr tablet TAKE 1 TABLET(150 MG) BY MOUTH DAILY 07/17/20   Marin Olp, MD  fluticasone [facility]) 50 MCG/ACT nasal spray Place 2 sprays into both nostrils daily. 02/23/20   Marin Olp, MD  ibuprofen (ADVIL,MOTRIN) 200 MG tablet Take 200 mg by mouth every 6 (six) hours as needed.    [provider]  olmesartan (BENICAR) 40 MG tablet TAKE 1 TABLET(40 MG) BY MOUTH DAILY 07/17/20   Marin Olp, MD  triamcinolone cream (KENALOG) 0.1 % Apply 1 application topically 2 (two) times daily. For 7-10 days maximum 02/23/20   Marin Olp, MD    Allergies    Benadryl [diphenhydramine hcl] and Morphine and related  Review of Systems   Review of Systems  Constitutional: Negative for fever.  HENT: Negative for sore  throat.   Eyes: Negative for redness.  Respiratory: Negative for shortness of breath.   Cardiovascular: Negative for chest pain.  Gastrointestinal: Positive for constipation. Negative for abdominal pain and vomiting.  Genitourinary: Negative for dysuria and flank pain.  Musculoskeletal: Negative for back pain.  Skin: Negative for rash.  Neurological: Negative for numbness and headaches.  Hematological: Does not bruise/bleed easily.  Psychiatric/Behavioral: Negative for confusion.    Physical Exam Updated Vital Signs Ht 1.778 m (5\' 10" )   Wt (!) 138.3 kg   BMI 43.76 kg/m   Physical Exam Vitals and nursing note reviewed.  Constitutional:      Appearance: Normal appearance. He is well-developed.  HENT:     Head: Atraumatic.     Nose: Nose normal.     Mouth/Throat:     Mouth: Mucous membranes are moist.     Pharynx: Oropharynx is clear.  Eyes:     General: No scleral icterus.    Conjunctiva/sclera: Conjunctivae normal.  Neck:     Trachea: No tracheal deviation.  Cardiovascular:     Rate and Rhythm: Normal rate and regular rhythm.     Pulses: Normal pulses.     Heart sounds: Normal heart sounds. No murmur heard. No friction rub. No gallop.   Pulmonary:     Effort: Pulmonary effort is normal. No accessory muscle usage or respiratory distress.     Breath sounds: Normal breath sounds.  Abdominal:     General: Bowel sounds are normal. There is no distension.     Palpations: Abdomen is soft. There is no mass.     Tenderness: There is no abdominal tenderness. There is no guarding or rebound.     Hernia: No hernia is present.  Genitourinary:    Comments: No cva tenderness. Soft brown stool. No impaction or mass felt.  Musculoskeletal:        General: No swelling.     Cervical back: Neck supple.  Skin:    General: Skin is warm and dry.     Findings: No rash.  Neurological:     Mental Status: He is alert.     Comments: Alert, speech clear. Steady gait.   Psychiatric:         Mood and Affect: Mood normal.     ED Results / Procedures / Treatments   Labs (all labs ordered are listed, but only abnormal results are displayed) Results for orders placed or performed in visit on 07/25/20  CBC with Differential/Platelet  Result Value Ref Range   WBC 6.9 4.0 - 10.5 K/uL   RBC 4.66 4.22 - 5.81  Mil/uL   Hemoglobin 16.1 13.0 - 17.0 g/dL   HCT 45.2 39.0 - 52.0 %   MCV 96.9 78.0 - 100.0 fl   MCHC 35.6 30.0 - 36.0 g/dL   RDW 12.3 11.5 - 15.5 %   Platelets 329.0 150.0 - 400.0 K/uL   Neutrophils Relative % 60.4 43.0 - 77.0 %   Lymphocytes Relative 26.1 12.0 - 46.0 %   Monocytes Relative 10.7 3.0 - 12.0 %   Eosinophils Relative 2.3 0.0 - 5.0 %   Basophils Relative 0.5 0.0 - 3.0 %   Neutro Abs 4.2 1.4 - 7.7 K/uL   Lymphs Abs 1.8 0.7 - 4.0 K/uL   Monocytes Absolute 0.7 0.1 - 1.0 K/uL   Eosinophils Absolute 0.2 0.0 - 0.7 K/uL   Basophils Absolute 0.0 0.0 - 0.1 K/uL  Comprehensive metabolic panel  Result Value Ref Range   Sodium 138 135 - 145 mEq/L   Potassium 4.4 3.5 - 5.1 mEq/L   Chloride 105 96 - 112 mEq/L   CO2 23 19 - 32 mEq/L   Glucose, Bld 100 (H) 70 - 99 mg/dL   BUN 16 6 - 23 mg/dL   Creatinine, Ser 1.00 0.40 - 1.50 mg/dL   Total Bilirubin 0.5 0.2 - 1.2 mg/dL   Alkaline Phosphatase 53 39 - 117 U/L   AST 33 0 - 37 U/L   ALT 55 (H) 0 - 53 U/L   Total Protein 7.3 6.0 - 8.3 g/dL   Albumin 4.3 3.5 - 5.2 g/dL   GFR 81.02 >60.00 mL/min   Calcium 9.5 8.4 - 10.5 mg/dL  Lipase  Result Value Ref Range   Lipase 37.0 11.0 - 59.0 U/L   DG Abd 1 View  Result Date: 07/25/2020 CLINICAL DATA:  Constipation. EXAM: ABDOMEN - 1 VIEW COMPARISON:  None. FINDINGS: The bowel gas pattern is normal. A mild amount of stool is seen within the descending colon. No radio-opaque calculi or other significant radiographic abnormality are seen. IMPRESSION: 1. Mild stool burden without evidence of bowel obstruction. Electronically Signed   By: Virgina Norfolk M.D.   On:  07/25/2020 18:23    EKG None  Radiology DG Abd 1 View  Result Date: 07/25/2020 CLINICAL DATA:  Constipation. EXAM: ABDOMEN - 1 VIEW COMPARISON:  None. FINDINGS: The bowel gas pattern is normal. A mild amount of stool is seen within the descending colon. No radio-opaque calculi or other significant radiographic abnormality are seen. IMPRESSION: 1. Mild stool burden without evidence of bowel obstruction. Electronically Signed   By: Virgina Norfolk M.D.   On: 07/25/2020 18:23    Procedures Procedures   Medications Ordered in ED Medications - No data to display  ED Course  I have reviewed the triage vital signs and the nursing notes.  Pertinent labs & imaging results that were available during my care of the patient were reviewed by me and considered in my medical decision making (see chart for details).    MDM Rules/Calculators/A&P                         Reviewed nursing notes and prior charts for additional history.   Labs from earlier today reviewed/interpreted by me - wbc normal, lytes normal.   Imaging ordered.   Xrays reviewed/interpreted by me - no sbo.   Abd soft non tender. Tolerating po.  Rec colace bid, miralax prn, etoh cessation, pcp f/u.  Return precautions provided.      Final Clinical Impression(s) /  ED Diagnoses Final diagnoses:  None    Rx / DC Orders ED Discharge Orders    None       Lajean Saver, MD 07/27/20 [phone]

## 2020-07-25 NOTE — Discharge Instructions (Addendum)
It was our pleasure to provide your ER care today - we hope that you feel better.  Drink plenty of fluids, stay well hydrated. Get adequate fiber in diet. Take colace (stool softener) 2x/day.  Take miralax once a day as need (laxative).  Both colace and miralax are available over the counter.   Follow up with primary care doctor in 1 week.  Your blood pressure is high today - continue blood pressure medication, limit salt intake, and follow up with your doctor for recheck.  Return to ER right away if worse, new symptoms, fevers, new, worsening or severe abdominal pain, persistent vomiting, or other concern.

## 2020-07-25 NOTE — ED Notes (Signed)
Back from imaging.

## 2020-08-03 ENCOUNTER — Encounter (HOSPITAL_COMMUNITY): Payer: Self-pay

## 2020-08-03 ENCOUNTER — Other Ambulatory Visit: Payer: Self-pay

## 2020-08-03 ENCOUNTER — Emergency Department (HOSPITAL_COMMUNITY)
Admission: EM | Admit: 2020-08-03 | Discharge: 2020-08-04 | Disposition: A | Discharge status: Home / Self Care | Payer: BC Managed Care – PPO | Attending: Emergency Medicine | Admitting: Emergency Medicine

## 2020-08-03 ENCOUNTER — Emergency Department (HOSPITAL_COMMUNITY): Payer: BC Managed Care – PPO

## 2020-08-03 DIAGNOSIS — M549 Dorsalgia, unspecified: Secondary | ICD-10-CM | POA: Insufficient documentation

## 2020-08-03 DIAGNOSIS — Z8719 Personal history of other diseases of the digestive system: Secondary | ICD-10-CM | POA: Diagnosis not present

## 2020-08-03 DIAGNOSIS — K5900 Constipation, unspecified: Secondary | ICD-10-CM | POA: Insufficient documentation

## 2020-08-03 DIAGNOSIS — R110 Nausea: Secondary | ICD-10-CM | POA: Diagnosis not present

## 2020-08-03 DIAGNOSIS — Z79899 Other long term (current) drug therapy: Secondary | ICD-10-CM | POA: Insufficient documentation

## 2020-08-03 DIAGNOSIS — R000 Tachycardia, unspecified: Secondary | ICD-10-CM | POA: Diagnosis not present

## 2020-08-03 DIAGNOSIS — Z87891 Personal history of nicotine dependence: Secondary | ICD-10-CM | POA: Insufficient documentation

## 2020-08-03 DIAGNOSIS — I10 Essential (primary) hypertension: Secondary | ICD-10-CM | POA: Diagnosis not present

## 2020-08-03 DIAGNOSIS — R109 Unspecified abdominal pain: Secondary | ICD-10-CM | POA: Insufficient documentation

## 2020-08-03 LAB — COMPREHENSIVE METABOLIC PANEL
ALT: 54 U/L — ABNORMAL HIGH (ref 0–44)
AST: 37 U/L (ref 15–41)
Albumin: 4.6 g/dL (ref 3.5–5.0)
Alkaline Phosphatase: 57 U/L (ref 38–126)
Anion gap: 10 (ref 5–15)
BUN: 15 mg/dL (ref 8–23)
CO2: 21 mmol/L — ABNORMAL LOW (ref 22–32)
Calcium: 9.8 mg/dL (ref 8.9–10.3)
Chloride: 105 mmol/L (ref 98–111)
Creatinine, Ser: 1.07 mg/dL (ref 0.61–1.24)
GFR, Estimated: >60 mL/min (ref >60)
Glucose, Bld: 113 mg/dL — ABNORMAL HIGH (ref 70–99)
Potassium: 4.1 mmol/L (ref 3.5–5.1)
Sodium: 136 mmol/L (ref 135–145)
Total Bilirubin: 0.9 mg/dL (ref 0.3–1.2)
Total Protein: 8.3 g/dL — ABNORMAL HIGH (ref 6.5–8.1)

## 2020-08-03 LAB — URINALYSIS, ROUTINE W REFLEX MICROSCOPIC
Bilirubin Urine: NEGATIVE
Glucose, UA: NEGATIVE mg/dL
Hgb urine dipstick: NEGATIVE
Ketones, ur: 5 mg/dL — AB
Leukocytes,Ua: NEGATIVE
Nitrite: NEGATIVE
Protein, ur: 100 mg/dL — AB
Specific Gravity, Urine: 1.031 — ABNORMAL HIGH (ref 1.005–1.030)
pH: 5 (ref 5.0–8.0)

## 2020-08-03 LAB — CBC
HCT: 47.4 % (ref 39.0–52.0)
Hemoglobin: 16.4 g/dL (ref 13.0–17.0)
MCH: 33.1 pg (ref 26.0–34.0)
MCHC: 34.6 g/dL (ref 30.0–36.0)
MCV: 95.6 fL (ref 80.0–100.0)
Platelets: 331 10*3/uL (ref 150–400)
RBC: 4.96 MIL/uL (ref 4.22–5.81)
RDW: 11.5 % (ref 11.5–15.5)
WBC: 7.8 10*3/uL (ref 4.0–10.5)
nRBC: 0 % (ref 0.0–0.2)

## 2020-08-03 MED ORDER — SODIUM CHLORIDE 0.9 % IV SOLN: INTRAVENOUS | Status: DC

## 2020-08-03 MED ORDER — HYDROMORPHONE HCL 1 MG/ML IJ SOLN
1.0000 mg | Freq: Once | INTRAMUSCULAR | Status: AC
Start: 2020-08-03 — End: 2020-08-03
  Administered 2020-08-03: 1 mg via INTRAVENOUS
  Filled 2020-08-03: qty 1

## 2020-08-03 MED ORDER — ONDANSETRON HCL 4 MG/2ML IJ SOLN
4.0000 mg | Freq: Once | INTRAMUSCULAR | Status: AC
  Administered 2020-08-03: 4 mg via INTRAVENOUS
  Filled 2020-08-03: qty 2

## 2020-08-03 NOTE — ED Triage Notes (Signed)
Patient c/o lower back pain and states at times it radiates into the RLQ x 10 days. Patient states he has been taking stool softeners and miralax. and states that he has been having watery stools, but no solid stool. Patient states the pain eases after having the liquid stools, but then the pain returns.

## 2020-08-03 NOTE — ED Provider Notes (Signed)
[facility] DEPT Provider Note   CSN: [id number] Arrival date & time: 08/03/20  1800     History Chief Complaint  Patient presents with  . Back Pain  . Constipation    [name] [name] is a 60 y.o. male.  Patient is a 60 year old male with a history of kidney stones, diverticulitis, hypertension and intermittent constipation who is presenting today with complaints of 10 days of persistent right flank pain and right-sided abdominal pain.  He initially felt like he was constipated and saw his PCP last Tuesday.  They encouraged oral medication and good hydration.  He reports the pain however worsened and he was seen at [facility] emergency room last week.  At that time he had a normal rectal exam, benign exam, normal labs and an x-ray that showed mild retention of stool.  He has been continuing medication for constipation drinking plenty of fluids and taking Tylenol and ibuprofen for the pain.  However he is having very loose bowel movements which will at times relieve the pain but it continues to persist.  Yesterday he was feeling mild malaise but today when he woke up the pain was severe.  It is in his right flank but will radiate to the right side and into the groin at times.  He has had some nausea but denies any vomiting.  No blood in his stool and urine has seemed normal to him.  The history is provided by the patient.  Back Pain Constipation Associated symptoms: back pain        Past Medical History:  Diagnosis Date  . Allergy   . Diverticulitis    leading to approx 1 foot resection-colectomy  . History of kidney stones   . Hx of adenomatous colonic polyps 10/19/2017  . Hypertension   . NEPHROLITHIASIS, HX OF 03/09/2007  . PONV (postoperative nausea and vomiting)   . SPRAIN/STRAIN, ANKLE NOS 01/05/2007    Patient Active Problem List   Diagnosis Date Noted  . Depression, major, single episode, moderate ([place]) 12/21/2019  . Morbid obesity ([place])  11/06/2017  . Hx of adenomatous colonic polyps 10/19/2017  . Diverticulitis   . Stress fracture 07/04/2014  . Right-sided thoracic back pain 11/28/2013  . HTN (hypertension) 05/09/2012    Past Surgical History:  Procedure Laterality Date  . COLECTOMY  09/2008   Dr Excell Seltzer  . COLONOSCOPY    . HERNIA REPAIR  09/18/12   lap incisional hernia repair, related to colectomy  . VENTRAL HERNIA REPAIR N/A 09/18/2012       Family History  Problem Relation Age of Onset  . CVA Mother        9  . Heart attack Father        32  . Sleep apnea Father        noncompliant with CPAP  . Pulmonary Hypertension Father        related to cpap noncompliance. died 46 sepsis   . Colon cancer Neg Hx   . Colon polyps Neg Hx   . Esophageal cancer Neg Hx   . Rectal cancer Neg Hx   . Stomach cancer Neg Hx     Social History   Tobacco Use  . Smoking status: Former Smoker    Types: Cigarettes, E-cigarettes    Quit date: 05/13/2016    Years since quitting: 4.2  . Smokeless tobacco: Never Used  . Tobacco comment: Doing Vape in the evenings  Vaping Use  . Vaping Use: Former  Substance Use Topics  . Alcohol use: Yes    Alcohol/week: 0.0 standard drinks    Comment: occasionally  . Drug use: No    Home Medications Prior to Admission medications   Medication Sig Start Date End Date Taking? Authorizing Provider  acetaminophen (TYLENOL) 500 MG tablet Take 500 mg by mouth every 6 (six) hours as needed for pain.    [provider]  amLODipine (NORVASC) 10 MG tablet TAKE 1 TABLET(10 MG) BY MOUTH DAILY 07/17/20   Marin Olp, MD  buPROPion (WELLBUTRIN XL) 150 MG 24 hr tablet TAKE 1 TABLET(150 MG) BY MOUTH DAILY 07/17/20   Marin Olp, MD  fluticasone [facility]) 50 MCG/ACT nasal spray Place 2 sprays into both nostrils daily. 02/23/20   Marin Olp, MD  ibuprofen (ADVIL,MOTRIN) 200 MG tablet Take 200 mg by mouth every 6 (six) hours as needed.    [provider]  olmesartan  (BENICAR) 40 MG tablet TAKE 1 TABLET(40 MG) BY MOUTH DAILY 07/17/20   Marin Olp, MD  triamcinolone cream (KENALOG) 0.1 % Apply 1 application topically 2 (two) times daily. For 7-10 days maximum 02/23/20   Marin Olp, MD    Allergies    Benadryl [diphenhydramine hcl] and Morphine and related  Review of Systems   Review of Systems  Gastrointestinal: Positive for constipation.  Musculoskeletal: Positive for back pain.  All other systems reviewed and are negative.   Physical Exam Updated Vital Signs BP (!) 130/98   Pulse (!) 104   Temp 99.1 F (37.3 C) (Oral)   Resp 20   Ht 5\' 10"  (1.778 m)   Wt 133.8 kg   SpO2 94%   BMI 42.33 kg/m   Physical Exam Vitals and nursing note reviewed.  Constitutional:      General: He is not in acute distress.    Appearance: He is well-developed.     Comments: Appears uncomfortable  HENT:     Head: Normocephalic and atraumatic.  Eyes:     Conjunctiva/sclera: Conjunctivae normal.     Pupils: Pupils are equal, round, and reactive to light.  Cardiovascular:     Rate and Rhythm: Regular rhythm. Tachycardia present.     Heart sounds: No murmur heard.   Pulmonary:     Effort: Pulmonary effort is normal. No respiratory distress.     Breath sounds: Normal breath sounds. No wheezing or rales.  Abdominal:     General: There is no distension.     Palpations: Abdomen is soft.     Tenderness: There is no abdominal tenderness. There is right CVA tenderness. There is no guarding or rebound.  Musculoskeletal:        General: No tenderness. Normal range of motion.     Cervical back: Normal range of motion and neck supple.  Skin:    General: Skin is warm and dry.     Findings: No erythema or rash.  Neurological:     Mental Status: He is alert and oriented to person, place, and time. Mental status is at baseline.  Psychiatric:        Mood and Affect: Mood normal.        Behavior: Behavior normal.        Thought Content: Thought content  normal.     ED Results / Procedures / Treatments   Labs (all labs ordered are listed, but only abnormal results are displayed) Labs Reviewed  CBC  COMPREHENSIVE METABOLIC PANEL  URINALYSIS, ROUTINE W REFLEX MICROSCOPIC  EKG None  Radiology No results found.  Procedures Procedures   Medications Ordered in ED Medications  0.9 %  sodium chloride infusion ( Intravenous New Bag/Given 08/03/20 1902)  ondansetron (ZOFRAN) injection 4 mg (4 mg Intravenous Given 08/03/20 1903)  HYDROmorphone (DILAUDID) injection 1 mg (1 mg Intravenous Given 08/03/20 1904)    ED Course  I have reviewed the triage vital signs and the nursing notes.  Pertinent labs & imaging results that were available during my care of the patient were reviewed by me and considered in my medical decision making (see chart for details).    MDM Rules/Calculators/A&P                          Patient presenting with persistent right flank and abdominal pain that has been present now for 10 days.  He denies any chest pain or shortness of breath.  Patient has no significant reproducible pain on exam but has been having multiple loose bowel movements and low suspicion at this point for constipation.  Possibility for recurrent diverticulitis versus kidney stone.  Lower suspicion for AAA or dissection.  Patient currently is having sharp waves of pain that are a 10 out of 10 and radiate from the flank to the side of the abdomen.  Will give IV pain control per patient's request.  Will repeat CBC, CMP and UA.  Patient will need imaging for further evaluation. Final Clinical Impression(s) / ED Diagnoses Final diagnoses:  None    Rx / DC Orders ED Discharge Orders    None       Blanchie Dessert, MD 08/07/20 [phone]

## 2020-08-04 ENCOUNTER — Ambulatory Visit: Payer: BC Managed Care – PPO | Admitting: Physician Assistant

## 2020-08-04 ENCOUNTER — Telehealth: Payer: Self-pay

## 2020-08-04 ENCOUNTER — Encounter: Payer: Self-pay | Admitting: Physician Assistant

## 2020-08-04 VITALS — BP 126/68 | HR 104 | Temp 98.3°F | Ht 70.0 in | Wt 305.0 lb

## 2020-08-04 DIAGNOSIS — R109 Unspecified abdominal pain: Secondary | ICD-10-CM | POA: Diagnosis not present

## 2020-08-04 DIAGNOSIS — Z86010 Personal history of colonic polyps: Secondary | ICD-10-CM | POA: Diagnosis not present

## 2020-08-04 MED ORDER — HYDROMORPHONE HCL 1 MG/ML IJ SOLN
1.0000 mg | Freq: Once | INTRAMUSCULAR | Status: AC
Start: 2020-08-04 — End: 2020-08-04
  Administered 2020-08-04: 1 mg via INTRAVENOUS
  Filled 2020-08-04: qty 1

## 2020-08-04 MED ORDER — KETOROLAC TROMETHAMINE 30 MG/ML IJ SOLN
30.0000 mg | Freq: Once | INTRAMUSCULAR | Status: AC
  Administered 2020-08-04: 30 mg via INTRAVENOUS
  Filled 2020-08-04: qty 1

## 2020-08-04 MED ORDER — OXYCODONE-ACETAMINOPHEN 5-325 MG PO TABS: 1.0000 | ORAL_TABLET | Freq: Four times a day (QID) | ORAL | 0 refills | Status: DC | PRN

## 2020-08-04 NOTE — Progress Notes (Signed)
[name] [name] is a 60 y.o. male is here for follow up.  I acted as a Education administrator for [employer], PA-C Anselmo Pickler, LPN   History of Present Illness:   Chief Complaint  Patient presents with  . Follow-up    ED  . Flank Pain    HPI  Pt here for follow up from ED visit yesterday 3/24 -- was seen for ongoing 10/10 flank pain.  He has had two ER visits since seeing me on 07/25/20. See my note for full HPI. 07/25/20 -- had normal xray, was told to continue colace BID and use prn miralax  Has been having intermittent bowel movements since that ER visit. His BMs do relieve pain at times but overall symptoms have persisted. Yesterday developed severe pain when he woke up -- had severe R flank pain radiating to groin, symptoms were 10/10. MD was concerned for diverticulitis vs nephrolithiasis. CT scan was negative.  He is still having constipation and not great success with miralax. Bought mag citrate and had some success over the weekend, but mostly lots of diarrhea. Recently ran out of colace. Has been taking metamucil.  He is due for colonoscopy in May.  Health Maintenance Due  Topic Date Due  . COVID-19 Vaccine (3 - Booster for [employer] series) 02/17/2020    Past Medical History:  Diagnosis Date  . Allergy   . Diverticulitis    leading to approx 1 foot resection-colectomy  . History of kidney stones   . Hx of adenomatous colonic polyps 10/19/2017  . Hypertension   . NEPHROLITHIASIS, HX OF 03/09/2007  . PONV (postoperative nausea and vomiting)   . SPRAIN/STRAIN, ANKLE NOS 01/05/2007     Social History   Tobacco Use  . Smoking status: Former Smoker    Types: Cigarettes, E-cigarettes    Quit date: 05/13/2016    Years since quitting: 4.2  . Smokeless tobacco: Never Used  . Tobacco comment: Doing Vape in the evenings  Vaping Use  . Vaping Use: Former  Substance Use Topics  . Alcohol use: Yes    Alcohol/week: 0.0 standard drinks    Comment: occasionally  . Drug use: No     Past Surgical History:  Procedure Laterality Date  . COLECTOMY  09/2008   Dr Excell Seltzer  . COLONOSCOPY    . HERNIA REPAIR  09/18/12   lap incisional hernia repair, related to colectomy  . VENTRAL HERNIA REPAIR N/A 09/18/2012    Family History  Problem Relation Age of Onset  . CVA Mother        51  . Heart attack Father        18  . Sleep apnea Father        noncompliant with CPAP  . Pulmonary Hypertension Father        related to cpap noncompliance. died 48 sepsis   . Colon cancer Neg Hx   . Colon polyps Neg Hx   . Esophageal cancer Neg Hx   . Rectal cancer Neg Hx   . Stomach cancer Neg Hx     PMHx, SurgHx, SocialHx, FamHx, Medications, and Allergies were reviewed in the Visit Navigator and updated as appropriate.   Patient Active Problem List   Diagnosis Date Noted  . Depression, major, single episode, moderate ([place]) 12/21/2019  . Morbid obesity ([place]) 11/06/2017  . Hx of adenomatous colonic polyps 10/19/2017  . Diverticulitis   . Stress fracture 07/04/2014  . Right-sided thoracic back pain 11/28/2013  . HTN (hypertension)  05/09/2012    Social History   Tobacco Use  . Smoking status: Former Smoker    Types: Cigarettes, E-cigarettes    Quit date: 05/13/2016    Years since quitting: 4.2  . Smokeless tobacco: Never Used  . Tobacco comment: Doing Vape in the evenings  Vaping Use  . Vaping Use: Former  Substance Use Topics  . Alcohol use: Yes    Alcohol/week: 0.0 standard drinks    Comment: occasionally  . Drug use: No    Current Medications and Allergies:    Current Outpatient Medications:  .  acetaminophen (TYLENOL) 500 MG tablet, Take 500 mg by mouth every 6 (six) hours as needed for pain., Disp: , Rfl:  .  amLODipine (NORVASC) 10 MG tablet, TAKE 1 TABLET(10 MG) BY MOUTH DAILY, Disp: 30 tablet, Rfl: 5 .  buPROPion (WELLBUTRIN XL) 150 MG 24 hr tablet, TAKE 1 TABLET(150 MG) BY MOUTH DAILY, Disp: 30 tablet, Rfl: 5 .  fluticasone (FLONASE) 50 MCG/ACT nasal  spray, Place 2 sprays into both nostrils daily., Disp: 16 g, Rfl: 2 .  ibuprofen (ADVIL,MOTRIN) 200 MG tablet, Take 200 mg by mouth every 6 (six) hours as needed., Disp: , Rfl:  .  olmesartan (BENICAR) 40 MG tablet, TAKE 1 TABLET(40 MG) BY MOUTH DAILY, Disp: 30 tablet, Rfl: 5 .  triamcinolone cream (KENALOG) 0.1 %, Apply 1 application topically 2 (two) times daily. For 7-10 days maximum, Disp: 80 g, Rfl: 0 .  oxyCODONE-acetaminophen (PERCOCET) 5-325 MG tablet, Take 1-2 tablets by mouth every 6 (six) hours as needed. (Patient not taking: Reported on 08/04/2020), Disp: 12 tablet, Rfl: 0   Allergies  Allergen Reactions  . Benadryl [Diphenhydramine Hcl] Itching    Patient stated allergy after ordered by MD  . Morphine And Related Itching    Review of Systems   ROS Negative unless otherwise specified per HPI.  Vitals:   Vitals:   08/04/20 1308  BP: 126/68  Pulse: (!) 104  Temp: 98.3 F (36.8 C)  TempSrc: Temporal  SpO2: 93%  Weight: (!) 305 lb (138.3 kg)  Height: 5\' 10"  (1.778 m)     Body mass index is 43.76 kg/m.   Physical Exam:    Physical Exam Vitals and nursing note reviewed.  Constitutional:      General: He is not in acute distress.    Appearance: He is well-developed. He is not ill-appearing or toxic-appearing.  Cardiovascular:     Rate and Rhythm: Normal rate and regular rhythm.     Pulses: Normal pulses.     Heart sounds: Normal heart sounds, S1 normal and S2 normal.     Comments: No LE edema Pulmonary:     Effort: Pulmonary effort is normal.     Breath sounds: Normal breath sounds.  Abdominal:     General: Abdomen is flat. Bowel sounds are normal.     Palpations: Abdomen is soft.     Tenderness: There is abdominal tenderness in the right lower quadrant. There is right CVA tenderness. There is no left CVA tenderness, guarding or rebound.  Skin:    General: Skin is warm and dry.  Neurological:     Mental Status: He is alert.     GCS: GCS eye subscore is  4. GCS verbal subscore is 5. GCS motor subscore is 6.  Psychiatric:        Speech: Speech normal.        Behavior: Behavior normal. Behavior is cooperative.      Assessment and  Plan:    Deklen was seen today for follow-up and flank pain.  Diagnoses and all orders for this visit:  Right sided abdominal pain -     Ambulatory referral to Gastroenterology  Hx of adenomatous colonic polyps -     Ambulatory referral to Gastroenterology   Reviewed ER precautions.  Suspect ongoing constipation, but cannot r/o other GI etiology. Does have significant hx of colectomy. Urgent referral to GI. Resume colace BID. Push fluids.  CMA or LPN served as scribe during this visit. History, Physical, and Plan performed by medical provider. The above documentation has been reviewed and is accurate and complete.  Time spent with patient today was 15 minutes which consisted of chart review, discussing diagnosis, work up, treatment answering questions and documentation.   Inda Coke, PA-C [place] [place] 08/04/2020  Follow-up: No follow-ups on file.

## 2020-08-04 NOTE — Discharge Instructions (Addendum)
Take ibuprofen 600 mg every 6 hours as needed for pain.  Take Percocet as prescribed as needed for pain not relieved with ibuprofen.  Follow-up with your primary doctor if your symptoms are not improving in the next 3 days, and return to the ER if you develop high fever, worsening pain, bloody stools, or other new and concerning symptoms.

## 2020-08-04 NOTE — Telephone Encounter (Signed)
ERROR

## 2020-08-04 NOTE — Patient Instructions (Signed)
It was great to see you!  Continue to work on better bowel movements.  Urgent GI referral placed they will call you on your phone.  Low threshold to go to the ER if symptoms worsen.  Take care,  Inda Coke PA-C

## 2020-08-04 NOTE — ED Provider Notes (Signed)
Care assumed from Dr. Maryan Rued at shift change.  Patient awaiting results of renal CT and urinalysis.  Patient has been having right flank pain for the past 10 days.  He was seen on 1 prior occasion at [facility], however work-up was unremarkable.  He has been taking laxatives with no relief.  CT renal and urinalysis both essentially unremarkable.  There is no evidence for kidney stone or other intra-abdominal process.  At this point, I see nothing that appears emergent.  Patient does seem to have ongoing pain and was given additional medication.  At this point, discharge seems appropriate.  Patient will be prescribed medication for his pain and advised to follow-up with his primary doctor if not improving, and return if symptoms worsen.   Veryl Speak, MD 08/04/20 [phone]

## 2020-08-08 ENCOUNTER — Encounter: Payer: Self-pay | Admitting: Internal Medicine

## 2020-09-15 NOTE — Progress Notes (Deleted)
Phone: [phone]   Subjective:  Patient presents today for their annual physical. Chief complaint-noted.   See problem oriented charting- ROS- full  review of systems was completed and negative  except for: ***  The following were reviewed and entered/updated in epic: Past Medical History:  Diagnosis Date  . Allergy   . Diverticulitis    leading to approx 1 foot resection-colectomy  . History of kidney stones   . Hx of adenomatous colonic polyps 10/19/2017  . Hypertension   . NEPHROLITHIASIS, HX OF 03/09/2007  . PONV (postoperative nausea and vomiting)   . SPRAIN/STRAIN, ANKLE NOS 01/05/2007   Patient Active Problem List   Diagnosis Date Noted  . Depression, major, single episode, moderate ([place]) 12/21/2019  . Morbid obesity ([place]) 11/06/2017  . Hx of adenomatous colonic polyps 10/19/2017  . Diverticulitis   . Stress fracture 07/04/2014  . Right-sided thoracic back pain 11/28/2013  . HTN (hypertension) 05/09/2012   Past Surgical History:  Procedure Laterality Date  . COLECTOMY  09/2008   Dr Excell Seltzer  . COLONOSCOPY    . HERNIA REPAIR  09/18/12   lap incisional hernia repair, related to colectomy  . VENTRAL HERNIA REPAIR N/A 09/18/2012    Family History  Problem Relation Age of Onset  . CVA Mother        24  . Heart attack Father        47  . Sleep apnea Father        noncompliant with CPAP  . Pulmonary Hypertension Father        related to cpap noncompliance. died 47 sepsis   . Colon cancer Neg Hx   . Colon polyps Neg Hx   . Esophageal cancer Neg Hx   . Rectal cancer Neg Hx   . Stomach cancer Neg Hx     Medications- reviewed and updated Current Outpatient Medications  Medication Sig Dispense Refill  . acetaminophen (TYLENOL) 500 MG tablet Take 500 mg by mouth every 6 (six) hours as needed for pain.    Marland Kitchen amLODipine (NORVASC) 10 MG tablet TAKE 1 TABLET(10 MG) BY MOUTH DAILY 30 tablet 5  . buPROPion (WELLBUTRIN XL) 150 MG 24 hr tablet TAKE 1 TABLET(150 MG) BY  MOUTH DAILY 30 tablet 5  . fluticasone (FLONASE) 50 MCG/ACT nasal spray Place 2 sprays into both nostrils daily. 16 g 2  . ibuprofen (ADVIL,MOTRIN) 200 MG tablet Take 200 mg by mouth every 6 (six) hours as needed.    Marland Kitchen olmesartan (BENICAR) 40 MG tablet TAKE 1 TABLET(40 MG) BY MOUTH DAILY 30 tablet 5  . oxyCODONE-acetaminophen (PERCOCET) 5-325 MG tablet Take 1-2 tablets by mouth every 6 (six) hours as needed. (Patient not taking: Reported on 08/04/2020) 12 tablet 0  . triamcinolone cream (KENALOG) 0.1 % Apply 1 application topically 2 (two) times daily. For 7-10 days maximum 80 g 0   No current facility-administered medications for this visit.    Allergies-reviewed and updated Allergies  Allergen Reactions  . Benadryl [Diphenhydramine Hcl] Itching    Patient stated allergy after ordered by MD  . Morphine And Related Itching    Social History   Social History Narrative   Single. Lives alone. Monogamous.    Long term friend of Dr. 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       Hobbies: working around [employer], mountain bike   Objective  Objective:  There were no vitals taken for this  visit. Gen: NAD, resting comfortably HEENT: Mucous membranes are moist. Oropharynx normal Neck: no thyromegaly CV: RRR no murmurs rubs or gallops Lungs: CTAB no crackles, wheeze, rhonchi Abdomen: soft/nontender/nondistended/normal bowel sounds. No rebound or guarding.  Ext: no edema Skin: warm, dry Neuro: grossly normal, moves all extremities, PERRLA ***   Assessment and Plan  60 y.o. male presenting for annual physical.  Health Maintenance counseling: 1. Anticipatory guidance: Patient counseled regarding regular dental exams ***q6 months, eye exams ***yearly,  avoiding smoking and second hand smoke*** , limiting alcohol to 2 beverages per day ***.   2. Risk factor reduction:  Advised patient of need for regular exercise and diet rich and  fruits and vegetables to reduce risk of heart attack and stroke. Exercise- ***. Diet-***.  Wt Readings from Last 3 Encounters:  08/04/20 (!) 305 lb (138.3 kg)  08/03/20 295 lb (133.8 kg)  07/25/20 (!) 305 lb (138.3 kg)   3. Immunizations/screenings/ancillary studies Immunization History  Administered Date(s) Administered  . Influenza Whole 02/16/2008  . Influenza,inj,Quad PF,6+ Mos 02/16/2018, 02/23/2020  . Influenza-Unspecified 02/24/2014, 02/28/2016, 02/10/2017  . MMR 11/05/2017  . PFIZER(Purple Top)SARS-COV-2 Vaccination 07/17/2019, 08/18/2019  . Tdap 07/28/2017  . Zoster Recombinat (Shingrix) 08/26/2017, 11/05/2017   Health Maintenance Due  Topic Date Due  . COVID-19 Vaccine (3 - Booster for [employer] series) 02/17/2020   4. Prostate cancer screening- *** No results found for: PSA 5. Colon cancer screening - *** 6. Skin cancer screening- ***advised regular sunscreen use. Denies worrisome, changing, or new skin lesions.  7. *** smoker 8. STD screening - ***  Status of chronic or acute concerns  # Depression S: Medication: bupropion 125m daily. [facility] 02/23/2020 12/21/2019 10/30/2018  Decreased Interest 1 3 0  Down, Depressed, Hopeless 1 3 0  PHQ - 2 Score 2 6 0  Altered sleeping 0 0 1  Tired, decreased energy 3 3 0  Change in appetite 0 0 0  Feeling bad or failure about yourself  0 3 0  Trouble concentrating 0 3 0  Moving slowly or fidgety/restless 0 0 0  Suicidal thoughts 0 1 0  PHQ-9 Score _0 Difficult doing work/chores Somewhat difficult Very difficult Not difficult at all   A/P: *** #hypertension S: medication: Amlodipine 10Mg daily, Olmesartan 40Mg daily Home readings #s: *** BP Readings from Last 3 Encounters:  08/04/20 126/68  08/04/20 (!) 143/93  07/25/20 130/76  A/P: ***   No specialty comments available. *** Preventative health care  Recommended follow up: ***No follow-ups on file. Future Appointments  Date Time Provider  [facility] 09/18/2020  4:00 PM HMarin Olp MD [facility] 09/27/2020  1:30 PM [facility] PREVISIT [id number] [facility]  10/11/2020  2:30 PM GGatha Mayer MD [facility]    No chief complaint on file.  Lab/Order associations:*** fasting   ICD-10-CM   1. Preventative health care  Z00.00     No orders of the defined types were placed in this encounter.   Return precautions advised.  KClyde Lundborg CMA

## 2020-09-15 NOTE — Patient Instructions (Incomplete)
Please stop by lab before you go If you have mychart- we will send your results within 3 business days of [place] receiving them.  If you do not have mychart- we will call you about results within 5 business days of [place] receiving them.  *please also note that you will see labs on mychart as soon as they post. I will later go in and write notes on them- will say "notes from Dr. Yong Channel"  Health Maintenance Due  Topic Date Due  . COVID-19 Vaccine (3 - Booster for [employer] series) 02/17/2020   Depression screen [facility] 2/9 02/23/2020 12/21/2019 10/30/2018  Decreased Interest 1 3 0  Down, Depressed, Hopeless 1 3 0  PHQ - 2 Score 2 6 0  Altered sleeping 0 0 1  Tired, decreased energy 3 3 0  Change in appetite 0 0 0  Feeling bad or failure about yourself  0 3 0  Trouble concentrating 0 3 0  Moving slowly or fidgety/restless 0 0 0  Suicidal thoughts 0 1 0  PHQ-9 Score 5 16 1   Difficult doing work/chores Somewhat difficult Very difficult Not difficult at all

## 2020-09-18 ENCOUNTER — Encounter: Payer: BC Managed Care – PPO | Admitting: Family Medicine

## 2020-09-27 ENCOUNTER — Ambulatory Visit (AMBULATORY_SURGERY_CENTER): Payer: Self-pay | Admitting: *Deleted

## 2020-09-27 ENCOUNTER — Other Ambulatory Visit: Payer: Self-pay

## 2020-09-27 VITALS — Ht 70.0 in | Wt 280.0 lb

## 2020-09-27 DIAGNOSIS — Z86010 Personal history of colonic polyps: Secondary | ICD-10-CM

## 2020-09-27 MED ORDER — NA SULFATE-K SULFATE-MG SULF 17.5-3.13-1.6 GM/177ML PO SOLN: 1.0000 | Freq: Once | ORAL | 0 refills | Status: AC

## 2020-09-27 NOTE — Progress Notes (Addendum)
No egg or soy allergy known to patient  No issues with past sedation with any surgeries or procedures Patient denies ever being told they had issues or difficulty with intubation  No FH of Malignant Hyperthermia No diet pills per patient No home 02 use per patient  No blood thinners per patient  Pt denies issues with constipation  No A fib or A flutter  EMMI video to pt or via MyChart  COVID 19 guidelines implemented in [place] today with Pt and RN  Pt is fully vaccinated  for Covid    Discussed with pt there will be an out-of-pocket cost for prep and that varies from $0 to 70 dollars   Due to the COVID-19 pandemic we are asking patients to follow certain guidelines.  Pt aware of COVID protocols and [facility] guidelines   

## 2020-10-04 ENCOUNTER — Encounter: Payer: Self-pay | Admitting: Internal Medicine

## 2020-10-10 ENCOUNTER — Telehealth: Payer: Self-pay | Admitting: Internal Medicine

## 2020-10-10 NOTE — Telephone Encounter (Signed)
Okay to no charge

## 2020-10-10 NOTE — Telephone Encounter (Signed)
Good morning Dr. Carlean Purl, patient called stating due to a private manner, he rescheduled his procedure from 10/13/20 to 10/24/20.

## 2020-10-11 ENCOUNTER — Encounter: Payer: BC Managed Care – PPO | Admitting: Internal Medicine

## 2020-10-24 ENCOUNTER — Ambulatory Visit (AMBULATORY_SURGERY_CENTER): Payer: BC Managed Care – PPO | Admitting: Internal Medicine

## 2020-10-24 ENCOUNTER — Encounter: Payer: Self-pay | Admitting: Internal Medicine

## 2020-10-24 ENCOUNTER — Other Ambulatory Visit: Payer: Self-pay

## 2020-10-24 VITALS — BP 146/57 | HR 86 | Temp 97.7°F | Resp 16 | Ht 70.0 in | Wt 280.0 lb

## 2020-10-24 DIAGNOSIS — D128 Benign neoplasm of rectum: Secondary | ICD-10-CM

## 2020-10-24 DIAGNOSIS — Z86010 Personal history of colonic polyps: Secondary | ICD-10-CM | POA: Diagnosis not present

## 2020-10-24 DIAGNOSIS — D124 Benign neoplasm of descending colon: Secondary | ICD-10-CM

## 2020-10-24 DIAGNOSIS — D125 Benign neoplasm of sigmoid colon: Secondary | ICD-10-CM

## 2020-10-24 DIAGNOSIS — D129 Benign neoplasm of anus and anal canal: Secondary | ICD-10-CM | POA: Diagnosis not present

## 2020-10-24 DIAGNOSIS — D123 Benign neoplasm of transverse colon: Secondary | ICD-10-CM

## 2020-10-24 DIAGNOSIS — D122 Benign neoplasm of ascending colon: Secondary | ICD-10-CM | POA: Diagnosis not present

## 2020-10-24 DIAGNOSIS — D127 Benign neoplasm of rectosigmoid junction: Secondary | ICD-10-CM

## 2020-10-24 MED ORDER — SODIUM CHLORIDE 0.9 % IV SOLN: 500.0000 mL | Freq: Once | INTRAVENOUS | Status: DC

## 2020-10-24 NOTE — Patient Instructions (Addendum)
I found and removed 8 tiny polyps.  You also have a condition called diverticulosis - common and not usually a problem. Please read the handout provided.  I will let you know pathology results and when to have another routine colonoscopy by mail and/or My Chart.   I appreciate the opportunity to care for you. Gatha Mayer, MD, FACG     YOU HAD AN ENDOSCOPIC PROCEDURE TODAY AT [facility]:   Refer to the procedure report that was given to you for any specific questions about what was found during the examination.  If the procedure report does not answer your questions, please call your gastroenterologist to clarify.  If you requested that your care partner not be given the details of your procedure findings, then the procedure report has been included in a sealed envelope for you to review at your convenience later.  YOU SHOULD EXPECT: Some feelings of bloating in the abdomen. Passage of more gas than usual.  Walking can help get rid of the air that was put into your GI tract during the procedure and reduce the bloating. If you had a lower endoscopy (such as a colonoscopy or flexible sigmoidoscopy) you may notice spotting of blood in your stool or on the toilet paper. If you underwent a bowel prep for your procedure, you may not have a normal bowel movement for a few days.  Please Note:  You might notice some irritation and congestion in your nose or some drainage.  This is from the oxygen used during your procedure.  There is no need for concern and it should clear up in a day or so.  SYMPTOMS TO REPORT IMMEDIATELY:  Following lower endoscopy (colonoscopy or flexible sigmoidoscopy):  Excessive amounts of blood in the stool  Significant tenderness or worsening of abdominal pains  Swelling of the abdomen that is new, acute  Fever of 100F or higher   For urgent or emergent issues, a gastroenterologist can be reached at any hour by calling [phone]. Do not use  MyChart messaging for urgent concerns.    DIET:  We do recommend a small meal at first, but then you may proceed to your regular diet.  Drink plenty of fluids but you should avoid alcoholic beverages for 24 hours.  ACTIVITY:  You should plan to take it easy for the rest of today and you should NOT DRIVE or use heavy machinery until tomorrow (because of the sedation medicines used during the test).    FOLLOW UP: Our staff will call the number listed on your records 48-72 hours following your procedure to check on you and address any questions or concerns that you may have regarding the information given to you following your procedure. If we do not reach you, we will leave a message.  We will attempt to reach you two times.  During this call, we will ask if you have developed any symptoms of COVID 19. If you develop any symptoms (ie: fever, flu-like symptoms, shortness of breath, cough etc.) before then, please call [phone].  If you test positive for Covid 19 in the 2 weeks post procedure, please call and report this information to [place].    If any biopsies were taken you will be contacted by phone or by letter within the next 1-3 weeks.  Please call us at [phone] if you have not heard about the biopsies in 3 weeks.    SIGNATURES/CONFIDENTIALITY: You and/or your care partner have signed paperwork which  will be entered into your electronic medical record.  These signatures attest to the fact that that the information above on your After Visit Summary has been reviewed and is understood.  Full responsibility of the confidentiality of this discharge information lies with you and/or your care-partner.    Resume medications. Information given on polyps and diverticulosis.

## 2020-10-24 NOTE — Progress Notes (Signed)
Called to room to assist during endoscopic procedure.  Patient ID and intended procedure confirmed with present staff. Received instructions for my participation in the procedure from the performing physician.  

## 2020-10-24 NOTE — Progress Notes (Signed)
Pt's states no medical or surgical changes since previsit or office visit. 

## 2020-10-24 NOTE — Progress Notes (Signed)
Report to PACU, RN, vss, BBS= Clear.  

## 2020-10-24 NOTE — Op Note (Signed)
[place] Patient Name: [name] [name] Procedure Date: 10/24/2020 2:11 PM MRN: [id number] Endoscopist: Gatha Mayer , MD Age: 60 Referring MD:  Date of Birth: [birth year] Gender: Male Account #: [id number] Procedure:                Colonoscopy Indications:              Surveillance: Personal history of adenomatous                            polyps on last colonoscopy 3 years ago Medicines:                Propofol per Anesthesia, Monitored Anesthesia Care Procedure:                Pre-Anesthesia Assessment:                           - Prior to the procedure, a History and Physical                            was performed, and patient medications and                            allergies were reviewed. The patient's tolerance of                            previous anesthesia was also reviewed. The risks                            and benefits of the procedure and the sedation                            options and risks were discussed with the patient.                            All questions were answered, and informed consent                            was obtained. Prior Anticoagulants: The patient has                            taken no previous anticoagulant or antiplatelet                            agents. ASA Grade Assessment: III - A patient with                            severe systemic disease. After reviewing the risks                            and benefits, the patient was deemed in                            satisfactory condition to undergo the procedure.  After obtaining informed consent, the colonoscope                            was passed under direct vision. Throughout the                            procedure, the patient's blood pressure, pulse, and                            oxygen saturations were monitored continuously. The                            Colonoscope was introduced through the anus and                             advanced to the the cecum, identified by                            appendiceal orifice and ileocecal valve. The                            colonoscopy was performed without difficulty. The                            patient tolerated the procedure well. The quality                            of the bowel preparation was adequate. The                            ileocecal valve, appendiceal orifice, and rectum                            were photographed. The bowel preparation used was                            SUPREP via split dose instruction. Scope In: 2:23:10 PM Scope Out: 2:40:10 PM Scope Withdrawal Time: 0 hours 14 minutes 40 seconds  Total Procedure Duration: 0 hours 17 minutes 0 seconds  Findings:                 The perianal and digital rectal examinations were                            normal.                           Eight sessile polyps were found in the rectum,                            sigmoid colon, descending colon, transverse colon                            and ascending colon. The polyps were diminutive in  size. These polyps were removed with a cold snare.                            Resection and retrieval were complete. Verification                            of patient identification for the specimen was                            done. Estimated blood loss was minimal.                           Multiple small and large-mouthed diverticula were                            found in the entire colon.                           The exam was otherwise without abnormality on                            direct and retroflexion views. Complications:            No immediate complications. Estimated Blood Loss:     Estimated blood loss was minimal. Impression:               - Eight diminutive polyps in the rectum, in the                            sigmoid colon, in the descending colon, in the                            transverse colon and in the  ascending colon,                            removed with a cold snare. Resected and retrieved.                           - Diverticulosis in the entire examined colon.                           - The examination was otherwise normal on direct                            and retroflexion views.                           - Personal history of colonic polyps. 6 adenomas                            2019 Recommendation:           - Patient has a contact number available for                            emergencies. The  signs and symptoms of potential                            delayed complications were discussed with the                            patient. Return to normal activities tomorrow.                            Written discharge instructions were provided to the                            patient.                           - Resume previous diet.                           - Continue present medications.                           - Repeat colonoscopy is recommended for                            surveillance. The colonoscopy date will be                            determined after pathology results from today's                            exam become available for review. Gatha Mayer, MD 10/24/2020 2:50:33 PM This report has been signed electronically.

## 2020-11-04 ENCOUNTER — Encounter: Payer: Self-pay | Admitting: Internal Medicine

## 2021-01-05 ENCOUNTER — Encounter: Payer: Self-pay | Admitting: Family Medicine

## 2021-01-13 ENCOUNTER — Other Ambulatory Visit: Payer: Self-pay | Admitting: Family Medicine

## 2021-02-13 ENCOUNTER — Other Ambulatory Visit: Payer: Self-pay | Admitting: Family Medicine

## 2021-03-07 ENCOUNTER — Telehealth: Payer: Self-pay

## 2021-03-07 NOTE — Telephone Encounter (Signed)
LAST APPOINTMENT DATE:  08/04/20  NEXT APPOINTMENT DATE: 09/10/21  MEDICATION:amLODipine (NORVASC) 10 MG tablet  olmesartan (BENICAR) 40 MG tablet  buPROPion (WELLBUTRIN XL) 150 MG 24 hr tablet  PHARMACY: [id number] - [place], [place] [address] AT [address]

## 2021-03-08 NOTE — Telephone Encounter (Signed)
Pt is scheduled °

## 2021-03-09 NOTE — Progress Notes (Signed)
Phone [phone] In person visit   Subjective:   [name] [name] is a 60 y.o. year old very pleasant male patient who presents for/with See problem oriented charting Chief Complaint  Patient presents with   Depression    This visit occurred during the SARS-CoV-2 public health emergency.  Safety protocols were in place, including screening questions prior to the visit, additional usage of staff PPE, and extensive cleaning of exam room while observing appropriate contact time as indicated for disinfecting solutions.   Past Medical History-  Patient Active Problem List   Diagnosis Date Noted   Hx of adenomatous colonic polyps 10/19/2017    Priority: Medium    HTN (hypertension) 05/09/2012    Priority: Medium    Diverticulitis     Priority: Low   Stress fracture 07/04/2014    Priority: Low   Right-sided thoracic back pain 11/28/2013    Priority: Low   Depression, major, single episode, moderate ([place]) 12/21/2019   Morbid obesity ([place]) 11/06/2017    Medications- reviewed and updated Current Outpatient Medications  Medication Sig Dispense Refill   acetaminophen (TYLENOL) 500 MG tablet Take 500 mg by mouth every 6 (six) hours as needed for pain.     ibuprofen (ADVIL,MOTRIN) 200 MG tablet Take 200 mg by mouth every 6 (six) hours as needed.     triamcinolone cream (KENALOG) 0.1 % Apply 1 application topically 2 (two) times daily. For 7-10 days maximum 80 g 0   amLODipine (NORVASC) 10 MG tablet Take 1 tablet (10 mg total) by mouth daily. 90 tablet 3   buPROPion (WELLBUTRIN XL) 150 MG 24 hr tablet TAKE 1 TABLET(150 MG) BY MOUTH DAILY 90 tablet 3   fluticasone (FLONASE) 50 MCG/ACT nasal spray Place 2 sprays into both nostrils daily. (Patient not taking: No sig reported) 16 g 2   olmesartan (BENICAR) 40 MG tablet Take 1 tablet (40 mg total) by mouth daily. 90 tablet 3   No current facility-administered medications for this visit.     Objective:  BP 126/84   Pulse 98   Temp 97.8  F (36.6 C)   Ht 5\' 10"  (1.778 m)   SpO2 92%   BMI 40.18 kg/m  Gen: NAD, resting comfortably CV: RRR no murmurs rubs or gallops Lungs: CTAB no crackles, wheeze, rhonchi Ext: no edema Rectal: normal tone, mild diffusely enlarged prostate, no masses or tenderness     Assessment and Plan    # Depression S: Medication: wellbutrin 150mg  daily started last visit. Recommended visits with Trey Paula for counseling but she has since retired.  Depression screen [facility] 2/9 03/16/2021 02/23/2020 12/21/2019  Decreased Interest 2 1 3   Down, Depressed, Hopeless 1 1 3   PHQ - 2 Score 3 2 6   Altered sleeping 1 0 0  Tired, decreased energy 1 3 3   Change in appetite 1 0 0  Feeling bad or failure about yourself  2 0 3  Trouble concentrating 2 0 3  Moving slowly or fidgety/restless 0 0 0  Suicidal thoughts 1 0 1  PHQ-9 Score 11 5 16   Difficult doing work/chores Somewhat difficult Somewhat difficult Very difficult  Had more panic at thought of covid at first- that is getting better nad he feels like overall improving GAD 7 : Generalized Anxiety Score 03/16/2021  Nervous, Anxious, on Edge 1  Control/stop worrying 0  Worry too much - different things 1  Trouble relaxing 0  Restless 0  Easily annoyed or irritable 1  Afraid - awful  might happen 1  Total GAD 7 Score 4  Anxiety Difficulty Somewhat difficult  A/P: partial remission- he feels like improving and wants to continue current meds- will recheck in 6 months- he will contact us with any worsening symptoms.  - fleeting thoughts of it not being end of the world for him to not be here with stress of world right now- no thoughts of self harmg  #hypertension S: medication: amlodipine 10 mg daily and olmesartan 40 mg daily BP Readings from Last 3 Encounters:  03/16/21 126/84  10/24/20 (!) 146/57  08/04/20 126/68  A/P: Controlled. Continue current medications.   #hyperlipidemia S: Medication:none  Lab Results  Component Value Date   CHOL 204  (A) 10/31/2017   HDL 56 10/31/2017   LDLCALC 127 10/31/2017   TRIG 106 10/31/2017   A/P: update lipids and calculate ascvd risk  #Left knee pain- Dr. Noemi Chapel saw him on Monday. Slight improvement. Discussed trial diclofenac gel in addition to tylenol. No fall or injury -may need MRI apparently.   # incontinence/nocturia/urinary frequency in daytime (This portion not very bothersome) S:at nighttime if gets up to go to restroom sometimes cannot hold it long enough to make to toilet. Has to go very quickly. A fair amount of urine when he goes. Gradual onset. 1-2x night nocturia- slowly worsened over last few years.  More frequent daytime urination A/P: we will start with urine tests and PSA today- if these are reassuring he will think over flomax for LUTS. Rectal exam with mild BPH   Recommended follow up: keep may physical Future Appointments  Date Time Provider [place]  09/10/2021  2:40 PM Marin Olp, MD [facility]   Lab/Order associations:   ICD-10-CM   1. Major depressive disorder in partial remission, unspecified whether recurrent ([place])  F32.4     2. Essential hypertension  I10 CBC with Differential/Platelet    Comprehensive metabolic panel    Lipid panel    3. Hyperlipidemia, unspecified hyperlipidemia type  E78.5     4. Nocturia  R35.1 PSA    POCT Urinalysis Dipstick (Automated)    Urine Culture    5. Need for immunization against influenza  Z23      Meds ordered this encounter  Medications   buPROPion (WELLBUTRIN XL) 150 MG 24 hr tablet    Sig: TAKE 1 TABLET(150 MG) BY MOUTH DAILY    Dispense:  90 tablet    Refill:  3   amLODipine (NORVASC) 10 MG tablet    Sig: Take 1 tablet (10 mg total) by mouth daily.    Dispense:  90 tablet    Refill:  3    Pt needs OV for future refills.   olmesartan (BENICAR) 40 MG tablet    Sig: Take 1 tablet (40 mg total) by mouth daily.    Dispense:  90 tablet    Refill:  3    I,Jada Bradford,acting as a scribe for  Garret Reddish, MD.,have documented all relevant documentation on the behalf of Garret Reddish, MD,as directed by  Garret Reddish, MD while in the presence of Garret Reddish, MD.  I, Garret Reddish, MD, have reviewed all documentation for this visit. The documentation on 03/16/21 for the exam, diagnosis, procedures, and orders are all accurate and complete.  Return precautions advised.  Garret Reddish, MD

## 2021-03-16 ENCOUNTER — Ambulatory Visit: Payer: BC Managed Care – PPO | Admitting: Family Medicine

## 2021-03-16 ENCOUNTER — Encounter: Payer: Self-pay | Admitting: Family Medicine

## 2021-03-16 ENCOUNTER — Other Ambulatory Visit: Payer: Self-pay

## 2021-03-16 VITALS — BP 126/84 | HR 98 | Temp 97.8°F | Ht 70.0 in

## 2021-03-16 DIAGNOSIS — I10 Essential (primary) hypertension: Secondary | ICD-10-CM | POA: Diagnosis not present

## 2021-03-16 DIAGNOSIS — F324 Major depressive disorder, single episode, in partial remission: Secondary | ICD-10-CM | POA: Diagnosis not present

## 2021-03-16 DIAGNOSIS — R351 Nocturia: Secondary | ICD-10-CM

## 2021-03-16 DIAGNOSIS — E785 Hyperlipidemia, unspecified: Secondary | ICD-10-CM | POA: Diagnosis not present

## 2021-03-16 DIAGNOSIS — H9190 Unspecified hearing loss, unspecified ear: Secondary | ICD-10-CM

## 2021-03-16 DIAGNOSIS — Z23 Encounter for immunization: Secondary | ICD-10-CM

## 2021-03-16 LAB — POC URINALSYSI DIPSTICK (AUTOMATED)
Bilirubin, UA: POSITIVE
Blood, UA: NEGATIVE
Glucose, UA: NEGATIVE
Ketones, UA: NEGATIVE
Leukocytes, UA: NEGATIVE
Nitrite, UA: NEGATIVE
Protein, UA: POSITIVE — AB
Spec Grav, UA: >=1.03 — AB (ref 1.010–1.025)
Urobilinogen, UA: 1 E.U./dL
pH, UA: 6 (ref 5.0–8.0)

## 2021-03-16 MED ORDER — OLMESARTAN MEDOXOMIL 40 MG PO TABS: 40.0000 mg | ORAL_TABLET | Freq: Every day | ORAL | 3 refills | Status: DC

## 2021-03-16 MED ORDER — BUPROPION HCL ER (XL) 150 MG PO TB24: ORAL_TABLET | ORAL | 3 refills | Status: DC

## 2021-03-16 MED ORDER — AMLODIPINE BESYLATE 10 MG PO TABS: 10.0000 mg | ORAL_TABLET | Freq: Every day | ORAL | 3 refills | Status: DC

## 2021-03-16 NOTE — Patient Instructions (Addendum)
Health Maintenance Due  Topic Date Due   INFLUENZA VACCINE - team please get date and log 12/11/2020   Please try Voltaren gel/Diclofenac gel, addition to your tylenol for your knee.   Let us know if you want to try flomax  Please stop by lab before you go If you have mychart- we will send your results within 3 business days of [place] receiving them.  If you do not have mychart- we will call you about results within 5 business days of [place] receiving them.  *please also note that you will see labs on mychart as soon as they post. I will later go in and write notes on them- will say "notes from Dr. Yong Channel"   Recommended follow up: keep may physical

## 2021-03-16 NOTE — Addendum Note (Signed)
Addended by: Loura Back on: 03/16/2021 03:03 PM   Modules accepted: Orders

## 2021-03-17 LAB — URINE CULTURE
MICRO NUMBER:: 12596353
SPECIMEN QUALITY:: ADEQUATE

## 2021-03-17 LAB — COMPREHENSIVE METABOLIC PANEL
AG Ratio: 1.5 (calc) (ref 1.0–2.5)
ALT: 33 U/L (ref 9–46)
AST: 25 U/L (ref 10–35)
Albumin: 4.8 g/dL (ref 3.6–5.1)
Alkaline phosphatase (APISO): 64 U/L (ref 35–144)
BUN: 24 mg/dL (ref 7–25)
CO2: 23 mmol/L (ref 20–32)
Calcium: 9.5 mg/dL (ref 8.6–10.3)
Chloride: 101 mmol/L (ref 98–110)
Creat: 1.1 mg/dL (ref 0.70–1.35)
Globulin: 3.1 g/dL (calc) (ref 1.9–3.7)
Glucose, Bld: 102 mg/dL — ABNORMAL HIGH (ref 65–99)
Potassium: 4.9 mmol/L (ref 3.5–5.3)
Sodium: 138 mmol/L (ref 135–146)
Total Bilirubin: 0.7 mg/dL (ref 0.2–1.2)
Total Protein: 7.9 g/dL (ref 6.1–8.1)

## 2021-03-17 LAB — CBC WITH DIFFERENTIAL/PLATELET
Absolute Monocytes: 822 cells/uL (ref 200–950)
Basophils Absolute: 32 cells/uL (ref 0–200)
Basophils Relative: 0.4 %
Eosinophils Absolute: 8 cells/uL — ABNORMAL LOW (ref 15–500)
Eosinophils Relative: 0.1 %
HCT: 51 % — ABNORMAL HIGH (ref 38.5–50.0)
Hemoglobin: 17.7 g/dL — ABNORMAL HIGH (ref 13.2–17.1)
Lymphs Abs: 2038 cells/uL (ref 850–3900)
MCH: 34.2 pg — ABNORMAL HIGH (ref 27.0–33.0)
MCHC: 34.7 g/dL (ref 32.0–36.0)
MCV: 98.6 fL (ref 80.0–100.0)
MPV: 8.5 fL (ref 7.5–12.5)
Monocytes Relative: 10.4 %
Neutro Abs: 5001 cells/uL (ref 1500–7800)
Neutrophils Relative %: 63.3 %
Platelets: 284 10*3/uL (ref 140–400)
RBC: 5.17 10*6/uL (ref 4.20–5.80)
RDW: 12.5 % (ref 11.0–15.0)
Total Lymphocyte: 25.8 %
WBC: 7.9 10*3/uL (ref 3.8–10.8)

## 2021-03-17 LAB — LIPID PANEL
Cholesterol: 218 mg/dL — ABNORMAL HIGH (ref <200)
HDL: 84 mg/dL (ref >=40)
LDL Cholesterol (Calc): 116 mg/dL (calc) — ABNORMAL HIGH
Non-HDL Cholesterol (Calc): 134 mg/dL (calc) — ABNORMAL HIGH (ref <130)
Total CHOL/HDL Ratio: 2.6 (calc) (ref <5.0)
Triglycerides: 79 mg/dL (ref <150)

## 2021-03-17 LAB — PSA: PSA: 1.15 ng/mL (ref <=4.00)

## 2021-04-02 ENCOUNTER — Encounter: Payer: Self-pay | Admitting: Family Medicine

## 2021-04-02 ENCOUNTER — Other Ambulatory Visit: Payer: Self-pay

## 2021-04-02 ENCOUNTER — Telehealth: Payer: Self-pay

## 2021-04-02 ENCOUNTER — Ambulatory Visit: Payer: BC Managed Care – PPO | Admitting: Family Medicine

## 2021-04-02 VITALS — BP 130/78 | HR 101 | Temp 98.0°F | Ht 70.0 in

## 2021-04-02 DIAGNOSIS — B9689 Other specified bacterial agents as the cause of diseases classified elsewhere: Secondary | ICD-10-CM

## 2021-04-02 DIAGNOSIS — R0981 Nasal congestion: Secondary | ICD-10-CM | POA: Diagnosis not present

## 2021-04-02 DIAGNOSIS — J329 Chronic sinusitis, unspecified: Secondary | ICD-10-CM | POA: Diagnosis not present

## 2021-04-02 DIAGNOSIS — I10 Essential (primary) hypertension: Secondary | ICD-10-CM | POA: Diagnosis not present

## 2021-04-02 LAB — POCT INFLUENZA A/B
Influenza A, POC: NEGATIVE
Influenza B, POC: NEGATIVE

## 2021-04-02 MED ORDER — AMOXICILLIN-POT CLAVULANATE 875-125 MG PO TABS: 1.0000 | ORAL_TABLET | Freq: Two times a day (BID) | ORAL | 0 refills | Status: AC

## 2021-04-02 NOTE — Patient Instructions (Addendum)
Please trial Augmentin twice daily to help with your sinuses for 7 days. Please update me if you're having any new, worsening or persistent symptoms. If you get short of breath or new high fevers seek care.   Recommended follow up: Return for as needed for new, worsening, persistent symptoms.  I hope you feel better and please have a Happy Thanksgiving!

## 2021-04-02 NOTE — Telephone Encounter (Signed)
Pt has OV scheduled for today.

## 2021-04-02 NOTE — Telephone Encounter (Signed)
Nurse Assessment Nurse: Felton Clinton, RN, Marisue [place] Date/Time Eilene Ghazi Time): 03/31/2021 12:33:24 PM Confirm and document reason for call. If symptomatic, describe symptoms. ---Caller states that he has a pretty bad cold and cough that has been going on for 9 days. States some sinus pain and pressure and doesnt seem to be getting better   Nurse Assessment Nurse: Felton Clinton, RN, Marisue [place] Date/Time Eilene Ghazi Time): 03/31/2021 12:33:24 PM Confirm and document reason for call. If symptomatic, describe symptoms. ---Caller states that he has a pretty bad cold and cough that has been going on for 9 days. States some sinus pain and pressure and doesnt seem to be getting better  COVID-19 - Diagnosed or Suspected [1] COVID-19 infection suspected by caller or triager [2] mild symptoms (cough, fever, or others) Layla Barter, [facility] 03/31/2021 12:34:31 PM [3] negative COVID-19 rapid test  03/31/2021 12:39:49 PM Call PCP when Office is Open Yes Felton Clinton, RN, Marisue [place]

## 2021-04-02 NOTE — Progress Notes (Signed)
Phone [phone] In person visit   Subjective:   [name] [name] is a 60 y.o. year old very pleasant male patient who presents for/with See problem oriented charting Chief Complaint  Patient presents with   Cold Symptoms   Cough    Pt c/o cold x11 days, achy nausea dizzy, declines fever, chest congestion. Pt has been using alkezelter without any releif.    This visit occurred during the SARS-CoV-2 public health emergency.  Safety protocols were in place, including screening questions prior to the visit, additional usage of staff PPE, and extensive cleaning of exam room while observing appropriate contact time as indicated for disinfecting solutions.   Past Medical History-  Patient Active Problem List   Diagnosis Date Noted   Hx of adenomatous colonic polyps 10/19/2017    Priority: Medium    HTN (hypertension) 05/09/2012    Priority: Medium    Diverticulitis     Priority: Low   Stress fracture 07/04/2014    Priority: Low   Right-sided thoracic back pain 11/28/2013    Priority: Low   Depression, major, single episode, moderate ([place]) 12/21/2019   Morbid obesity ([place]) 11/06/2017    Medications- reviewed and updated Current Outpatient Medications  Medication Sig Dispense Refill   acetaminophen (TYLENOL) 500 MG tablet Take 500 mg by mouth every 6 (six) hours as needed for pain.     amLODipine (NORVASC) 10 MG tablet Take 1 tablet (10 mg total) by mouth daily. 90 tablet 3   amoxicillin-clavulanate (AUGMENTIN) 875-125 MG tablet Take 1 tablet by mouth 2 (two) times daily for 7 days. 14 tablet 0   buPROPion (WELLBUTRIN XL) 150 MG 24 hr tablet TAKE 1 TABLET(150 MG) BY MOUTH DAILY 90 tablet 3   ibuprofen (ADVIL,MOTRIN) 200 MG tablet Take 200 mg by mouth every 6 (six) hours as needed.     olmesartan (BENICAR) 40 MG tablet Take 1 tablet (40 mg total) by mouth daily. 90 tablet 3   triamcinolone cream (KENALOG) 0.1 % Apply 1 application topically 2 (two) times daily. For 7-10 days  maximum 80 g 0   No current facility-administered medications for this visit.     Objective:  BP 130/78 Comment: retake in office  Pulse (!) 101   Temp 98 F (36.7 C)   Ht 5\' 10"  (1.778 m)   SpO2 95%   BMI 40.18 kg/m  Gen: NAD, resting comfortably Tympanic membrane's normal on visible portions-some blocked by cerumen, whitish discoloration of tongue but had just finished an altoid, oropharynx otherwise normal other than some signs of drainage in posterior pharynx.  No significant sinus pressure noted-clear nasal discharge noted and turbinates nasal turbinates swollen/erythematous. CV: RRR (on my exam-please note patient has high baseline heart rate in general usually in the 90s or 100s but at rest was under 100 on my exam) no murmurs rubs or gallops Lungs: CTAB no crackles, wheeze, rhonchi Ext: no edema Skin: warm, dry     Assessment and Plan   # Cough/congestion S:Patient reported having a cold for over 10 days however covid test was negative at home as was flu test today. Home covid test done last Friday and yesterday.   He reports day 11 of symptoms-he feels achy, nauseous including some dry heaves yesterday yesterday, dizziness.  Denies any fever or chest congestion.  No shortness of breath.  Alka-Seltzer cold and sinus without significant relief. Sinus congestion noted. Clear discharge from nose- has improved some. Feeling sinus pressure as well. Mild headaches. No wheezing.  Feeling run down/fatigued.  A/P: Cough/nasal congestion/nasal pressure/chest congestion for 11 days not improving says she has body aches and nausea.  Lungs clear on exam today.  We discussed there is still potential for pneumonia and offered chest x-ray-he wanted to hold off for now.  With over 10 days of sinus congestion and pressure we opted to treat for sinusitis with Augmentin for 7 days-if fails to improve agrees to consider chest x-ray or adding atypical coverage for pneumonia with azithromycin or  doxycycline. -vaccinated from flu and flu test negative today -Home COVID test x2 including last night negative-COVID unlikely -Even if had COVID or flu this late into illness would likely not change plan-still could have secondary bacterial infection  #hypertension S: medication: amlodipine 10 mg daily and olmesartan 40 mg daily BP Readings from Last 3 Encounters:  04/02/21 130/78  03/16/21 126/84  10/24/20 (!) 146/57  A/P: Blood pressure better on repeat -We discussed with hypertension needs to avoid any medication with phenylephrine or any product that lists decongestant  Recommended follow up: Return for as needed for new, worsening, persistent symptoms. Future Appointments  Date Time Provider [place]  09/10/2021  2:40 PM Marin Olp, MD [facility]    Lab/Order associations:   ICD-10-CM   1. Bacterial sinusitis  J32.9    B96.89     2. Essential hypertension  I10     3. Nasal congestion  R09.81 POCT Influenza A/B      Meds ordered this encounter  Medications   amoxicillin-clavulanate (AUGMENTIN) 875-125 MG tablet    Sig: Take 1 tablet by mouth 2 (two) times daily for 7 days.    Dispense:  14 tablet    Refill:  0    I,Jada Bradford,acting as a scribe for Garret Reddish, MD.,have documented all relevant documentation on the behalf of Garret Reddish, MD,as directed by  Garret Reddish, MD while in the presence of Garret Reddish, MD.   I, Garret Reddish, MD, have reviewed all documentation for this visit. The documentation on 04/02/21 for the exam, diagnosis, procedures, and orders are all accurate and complete.   Return precautions advised.  Garret Reddish, MD

## 2021-04-16 ENCOUNTER — Telehealth: Payer: Self-pay

## 2021-04-16 NOTE — Telephone Encounter (Signed)
Please offer visit but if he declines  you may send in buspirone 5 mg to be taken twice daily #60 - he can take as needed but likely works better taking twice a day then follow up with me perhaps 4-6 weeks.

## 2021-04-16 NOTE — Telephone Encounter (Signed)
Pt called regarding medication. Pt stated that he has been taking Wellbutrin. Pt stated that is mother fell and is currently having to go through surgery. Pt stated that he is extremely stressed. Pt wants to know if Dr Yong Channel can prescribe something. Pt would like a call back. Please Advise.

## 2021-04-17 NOTE — Telephone Encounter (Signed)
Called and spoke with pt and he states yesterday was just very stressful and overwhelming with his moms situation. Today is better so he wants to hold off on medication right now and will CB to schedule an OV to discuss if needed.

## 2021-07-23 NOTE — Progress Notes (Incomplete)
? ?Phone: [phone] ?  ?Subjective:  ?Patient presents today for their annual physical. Chief complaint-noted.  ? ?See problem oriented charting- ?ROS- full  review of systems was completed and negative  ?except for: *** ? ?The following were reviewed and entered/updated in epic: ?Past Medical History:  ?Diagnosis Date  ? Allergy   ? Diverticulitis   ? leading to approx 1 foot resection-colectomy  ? History of kidney stones   ? Hx of adenomatous colonic polyps 10/19/2017  ? Hypertension   ? NEPHROLITHIASIS, HX OF 03/09/2007  ? PONV (postoperative nausea and vomiting)   ? SPRAIN/STRAIN, ANKLE NOS 01/05/2007  ? ?Patient Active Problem List  ? Diagnosis Date Noted  ? Depression, major, single episode, moderate ([place]) 12/21/2019  ? Morbid obesity ([place]) 11/06/2017  ? Hx of adenomatous colonic polyps 10/19/2017  ? Diverticulitis   ? Stress fracture 07/04/2014  ? Right-sided thoracic back pain 11/28/2013  ? HTN (hypertension) 05/09/2012  ? ?Past Surgical History:  ?Procedure Laterality Date  ? COLECTOMY  09/2008  ? Dr Excell Seltzer  ? COLONOSCOPY    ? HERNIA REPAIR  09/18/12  ? lap incisional hernia repair, related to colectomy  ? VENTRAL HERNIA REPAIR N/A 09/18/2012  ? ? ?Family History  ?Problem Relation Age of Onset  ? CVA Mother   ?     70  ? Heart attack Father   ?     64  ? Sleep apnea Father   ?     noncompliant with CPAP  ? Pulmonary Hypertension Father   ?     related to cpap noncompliance. died 5 sepsis   ? Colon cancer Neg Hx   ? Colon polyps Neg Hx   ? Esophageal cancer Neg Hx   ? Rectal cancer Neg Hx   ? Stomach cancer Neg Hx   ? ? ?Medications- reviewed and updated ?Current Outpatient Medications  ?Medication Sig Dispense Refill  ? acetaminophen (TYLENOL) 500 MG tablet Take 500 mg by mouth every 6 (six) hours as needed for pain.    ? amLODipine (NORVASC) 10 MG tablet Take 1 tablet (10 mg total) by mouth daily. 90 tablet 3  ? buPROPion (WELLBUTRIN XL) 150 MG 24 hr tablet TAKE 1 TABLET(150 MG) BY MOUTH DAILY 90 tablet 3   ? ibuprofen (ADVIL,MOTRIN) 200 MG tablet Take 200 mg by mouth every 6 (six) hours as needed.    ? olmesartan (BENICAR) 40 MG tablet Take 1 tablet (40 mg total) by mouth daily. 90 tablet 3  ? triamcinolone cream (KENALOG) 0.1 % Apply 1 application topically 2 (two) times daily. For 7-10 days maximum 80 g 0  ? ?No current facility-administered medications for this visit.  ? ? ?Allergies-reviewed and updated ?Allergies  ?Allergen Reactions  ? Benadryl [Diphenhydramine Hcl] Itching  ?  Patient stated allergy after ordered by MD  ? Morphine And Related Itching  ? ? ?Social History  ? ?Social History Narrative  ? Single. Lives alone. Monogamous.   ? Long term friend of Dr. Arnoldo Morale outside of work (primary contact #)  ?   ? 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   ?   ? Hobbies: working around [employer], mountain bike  ? ?Objective  ?Objective:  ?There were no vitals taken for this visit. ?Gen: NAD, resting comfortably ?HEENT: Mucous membranes are moist. Oropharynx normal ?Neck: no thyromegaly ?CV: RRR no murmurs rubs or gallops ?Lungs: CTAB no crackles, wheeze, rhonchi ?Abdomen: soft/nontender/nondistended/normal bowel sounds. No rebound or guarding.  ?Ext: no edema ?  Skin: warm, dry ?Neuro: grossly normal, moves all extremities, PERRLA ?*** ?  ?Assessment and Plan  ?61 y.o. male presenting for annual physical.  ?Health Maintenance counseling: ?1. Anticipatory guidance: Patient counseled regarding regular dental exams ***q6 months, eye exams ***yearly,  avoiding smoking and second hand smoke*** , limiting alcohol to 2 beverages per day ***.  No illicit drugs - *** ?2. Risk factor reduction:  Advised patient of need for regular exercise and diet rich and fruits and vegetables to reduce risk of heart attack and stroke.  ?Exercise- ***.  ?Diet/weight management-***.  ?Wt Readings from Last 3 Encounters:  ?10/24/20 280 lb (127 kg)  ?09/27/20 280 lb (127 kg)  ?08/04/20 (!) 305 lb (138.3 kg)  ? ?3.  Immunizations/screenings/ancillary studies ?Immunization History  ?Administered Date(s) Administered  ? Influenza Whole 02/16/2008  ? Influenza,inj,Quad PF,6+ Mos 02/16/2018, 02/23/2020, 02/11/2021  ? Influenza-Unspecified 02/24/2014, 02/28/2016, 02/10/2017  ? MMR 11/05/2017  ? PFIZER(Purple Top)SARS-COV-2 Vaccination 07/17/2019, 08/18/2019, 03/04/2020, 09/02/2020  ? Pension scheme manager 45yr & up 02/16/2021  ? Tdap 07/28/2017  ? Zoster Recombinat (Shingrix) 08/26/2017, 11/05/2017  ? ?There are no preventive care reminders to display for this patient. 4. Prostate cancer screening- ***  ?Lab Results  ?Component Value Date  ? PSA 1.15 03/16/2021  ? 5. Colon cancer screening - 10/24/20 with 3 year repeat planned*** ?6. Skin cancer screening- ***advised regular sunscreen use. Denies worrisome, changing, or new skin lesions.  ?7. Smoking associated screening (lung cancer screening, AAA screen 65-75, UA)- *** smoker- *** ?8. STD screening - *** ? ?Status of chronic or acute concerns  ? ?#History of 8 adenomatous polyps June 2022 with 3-year repeat ? ?# Depression ?S: Medication: wellbutrin 1534mdaily started last visit. Recommended visits with LiTrey Paulaor counseling but she had since retired.  ?[facility] 03/16/2021 02/23/2020 12/21/2019  ?Decreased Interest '2 1 3  ' ?Down, Depressed, Hopeless '1 1 3  ' ?PHQ - 2 Score '3 2 6  ' ?Altered sleeping 1 0 0  ?Tired, decreased energy '1 3 3  ' ?Change in appetite 1 0 0  ?Feeling bad or failure about yourself  2 0 3  ?Trouble concentrating 2 0 3  ?Moving slowly or fidgety/restless 0 0 0  ?Suicidal thoughts 1 0 1  ?PHQ-9 Score '11 5 16  ' ?Difficult doing work/chores Somewhat difficult Somewhat difficult Very difficult  ? ?A/P: *** ?  ?#hypertension ?S: medication: amlodipine 10 mg daily and olmesartan 40 mg daily ?Home readings #s: *** ?BP Readings from Last 3 Encounters:  ?04/02/21 130/78  ?03/16/21 126/84  ?10/24/20 (!) 146/57  ?A/P: *** ?   ?#hyperlipidemia ?S: Medication:none  ?Lab Results  ?Component Value Date  ? CHOL 218 (H) 03/16/2021  ? HDL 84 03/16/2021  ? LDLCALC 116 (H) 03/16/2021  ? TRIG 79 03/16/2021  ? CHOLHDL 2.6 03/16/2021  ? A/P: *** ? ?#Left knee pain- seen Dr. WaNoemi Chapel Slight improvement. Discussed trial diclofenac gel in addition to tylenol. No fall or injury ?-may need MRI apparently.  ?  ?# incontinence/nocturia/urinary frequency in daytime (This portion not very bothersome) ?S:at nighttime if gets up to go to restroom sometimes cannot hold it long enough to make to toilet. Has to go very quickly. A fair amount of urine when he goes. Gradual onset. 1-2x night nocturia- slowly worsened over last few years.  More frequent daytime urination ?A/P: we will start with urine tests and PSA today- if these are reassuring he will think over flomax for LUTS. Rectal exam  with mild BPH ? ?Recommended follow up: No follow-ups on file. ?Future Appointments  ?Date Time Provider [place]  ?09/10/2021  2:40 PM Hunter, Brayton Mars, MD [facility]  ? ? ?No chief complaint on file. ? ?Lab/Order associations:*** fasting ?No diagnosis found. ? ?No orders of the defined types were placed in this encounter. ? ? ?Return precautions advised.  ?Burnett Corrente ? ? ? ?

## 2021-09-04 ENCOUNTER — Telehealth: Payer: Self-pay

## 2021-09-04 MED ORDER — TRIAMCINOLONE ACETONIDE 0.1 % EX CREA: 1.0000 "application " | TOPICAL_CREAM | Freq: Two times a day (BID) | CUTANEOUS | 0 refills | Status: DC

## 2021-09-04 NOTE — Telephone Encounter (Signed)
Rx refilled.

## 2021-09-04 NOTE — Telephone Encounter (Signed)
..   Encourage patient to contact the pharmacy for refills or they can request refills through [place]: 04/02/21  NEXT APPOINTMENT DATE: 09/10/21  MEDICATION: triamcinolone  Is the patient out of medication?   PHARMACY:  [facility]   Let patient know to contact pharmacy at the end of the day to make sure medication is ready.  Please notify patient to allow 48-72 hours to process

## 2021-09-10 ENCOUNTER — Encounter: Payer: BC Managed Care – PPO | Admitting: Family Medicine

## 2021-09-10 DIAGNOSIS — F324 Major depressive disorder, single episode, in partial remission: Secondary | ICD-10-CM

## 2021-09-10 DIAGNOSIS — Z0000 Encounter for general adult medical examination without abnormal findings: Secondary | ICD-10-CM

## 2021-09-10 DIAGNOSIS — I10 Essential (primary) hypertension: Secondary | ICD-10-CM

## 2021-09-10 DIAGNOSIS — E785 Hyperlipidemia, unspecified: Secondary | ICD-10-CM

## 2021-12-28 ENCOUNTER — Ambulatory Visit: Payer: BC Managed Care – PPO | Admitting: Internal Medicine

## 2021-12-28 ENCOUNTER — Encounter: Payer: Self-pay | Admitting: Internal Medicine

## 2021-12-28 DIAGNOSIS — L74510 Primary focal hyperhidrosis, axilla: Secondary | ICD-10-CM | POA: Diagnosis not present

## 2021-12-28 DIAGNOSIS — L0100 Impetigo, unspecified: Secondary | ICD-10-CM

## 2021-12-28 DIAGNOSIS — L304 Erythema intertrigo: Secondary | ICD-10-CM | POA: Diagnosis not present

## 2021-12-28 DIAGNOSIS — L0292 Furuncle, unspecified: Secondary | ICD-10-CM | POA: Diagnosis not present

## 2021-12-28 HISTORY — DX: Erythema intertrigo: L30.4

## 2021-12-28 HISTORY — DX: Impetigo, unspecified: L01.00

## 2021-12-28 HISTORY — DX: Primary focal hyperhidrosis, axilla: L74.510

## 2021-12-28 HISTORY — DX: Furuncle, unspecified: L02.92

## 2021-12-28 MED ORDER — DRYSOL 20 % EX SOLN
Freq: Every day | CUTANEOUS | 0 refills | Status: DC
Start: 2021-12-28 — End: 2023-07-31

## 2021-12-28 MED ORDER — DOXYCYCLINE HYCLATE 100 MG PO TABS: 100.0000 mg | ORAL_TABLET | Freq: Two times a day (BID) | ORAL | 0 refills | Status: DC

## 2021-12-28 MED ORDER — NYSTATIN 100000 UNIT/GM EX POWD
1.0000 | Freq: Three times a day (TID) | CUTANEOUS | 0 refills | Status: DC
Start: 2021-12-28 — End: 2023-07-31

## 2021-12-28 NOTE — Assessment & Plan Note (Signed)
Advised keep dry Suspect fungal rtc or call for derm if doesn't resolve.

## 2021-12-28 NOTE — Progress Notes (Signed)
   [name] [name] is a 61 y.o. male who presents today for an office visit.  Assessment/Plan:  Overview: Facial impetigo, axillary and neck intertrigo.  [name] was seen today for follow-up.  Impetigo any site -     Doxycycline Hyclate; Take 1 tablet (100 mg total) by mouth 2 (two) times daily.  Dispense: 14 tablet; Refill: 0  Intertrigo Assessment & Plan: Advised keep dry Suspect fungal rtc or call for derm if doesn't resolve.  Orders: -     Nystatin; Apply 1 Application topically 3 (three) times daily.  Dispense: 15 g; Refill: 0  Boil Overview: Offered to lance, he declined Will use doxy for impetigo and he will use warm compresses.   Sweaty armpits -     Drysol; Apply topically at bedtime.  Dispense: 35 mL; Refill: 0     No follow-ups on file.     Subjective:  HPI:  History taking was used to update the overview section of each addressed problem in the assessment/plan section above.  -a xillary rash bilateral r>l - also on face - triamcinoloone helps - last few days really red and anrgry, a little sore, it backed off a little last day or so - on and off last few months - dermatology offered, but he wanted to try here first.        Objective:  Physical Exam: BP 110/78 (BP Location: Left Arm)   Pulse (!) 105   Temp (!) 97.3 F (36.3 C) (Temporal)   Ht '5\' 10"'$  (1.778 m)   SpO2 94%   BMI 40.18 kg/m    Gen: No acute distress, resting comfortably Psych: Normal affect and thought content  Problem specific physical exam findings:  Rashes as pictured   No results found for any visits on 12/28/21.            [name] Pacas, MD 12/28/2021 2:14 PM

## 2022-01-11 ENCOUNTER — Ambulatory Visit: Payer: BC Managed Care – PPO | Admitting: Family

## 2022-01-11 ENCOUNTER — Encounter: Payer: Self-pay | Admitting: Family

## 2022-01-11 VITALS — BP 116/79 | HR 90 | Temp 97.7°F | Ht 70.0 in | Wt 285.0 lb

## 2022-01-11 DIAGNOSIS — J0140 Acute pansinusitis, unspecified: Secondary | ICD-10-CM | POA: Diagnosis not present

## 2022-01-11 DIAGNOSIS — H6123 Impacted cerumen, bilateral: Secondary | ICD-10-CM | POA: Diagnosis not present

## 2022-01-11 DIAGNOSIS — L0292 Furuncle, unspecified: Secondary | ICD-10-CM

## 2022-01-11 DIAGNOSIS — R0989 Other specified symptoms and signs involving the circulatory and respiratory systems: Secondary | ICD-10-CM | POA: Diagnosis not present

## 2022-01-11 MED ORDER — DOXYCYCLINE HYCLATE 100 MG PO TABS: 100.0000 mg | ORAL_TABLET | Freq: Two times a day (BID) | ORAL | 0 refills | Status: DC

## 2022-01-11 NOTE — Progress Notes (Signed)
Patient ID: [name] [name], male    DOB: 1961/02/02, 61 y.o.   MRN: [id number]  Chief Complaint  Patient presents with  . Sinusitis    2 weeks with sx    HPI: Sinusitis:  Pt c/o sinus symptoms including left ear feels full, sore throat, nasal congestion and sinus drainage from the nose to throat. Has tried ibuprofen and Ipratropium nasal spray that was prescribed but pt states it is not helping his symptoms. Symptoms started a couple weeks ago. Denies fever or cough, but exposed to friends positive for Covid, did not test at home. Right axillary boil:  first seen by Dr Randol Kern in office and given DOXY, pt reports finishing on Tuesday and he is doing better, but can still feel a knot, slightly tender. No erythema or pus that he can see.  Assessment & Plan:  1. Sore throat  - POC COVID-19 - POCT rapid strep A - POCT Influenza A/B  2. Acute non-recurrent pansinusitis sending DOXY, will treat for 7d, also finish treating axillary boil. Advised pt on continuing to use nasal spray and use saline nasal spray tid & prior to med. spray.  - doxycycline (VIBRA-TABS) 100 MG tablet; Take 1 tablet (100 mg total) by mouth 2 (two) times daily.  Dispense: 14 tablet; Refill: 0  3. Boil sending another round of DOXY. Also advised pt use Hibiclens soap with loufah sponge under both axilla daily to help prevent future episodes.  4. Bilateral impacted cerumen   Subjective:    Outpatient Medications Prior to Visit  Medication Sig Dispense Refill  . acetaminophen (TYLENOL) 500 MG tablet Take 500 mg by mouth every 6 (six) hours as needed for pain.    Marland Kitchen aluminum chloride (DRYSOL) 20 % external solution Apply topically at bedtime. 35 mL 0  . amLODipine (NORVASC) 10 MG tablet Take 1 tablet (10 mg total) by mouth daily. 90 tablet 3  . buPROPion (WELLBUTRIN XL) 150 MG 24 hr tablet TAKE 1 TABLET(150 MG) BY MOUTH DAILY 90 tablet 3  . ibuprofen (ADVIL,MOTRIN) 200 MG tablet Take 200 mg by mouth every 6 (six)  hours as needed.    . nystatin (MYCOSTATIN/NYSTOP) powder Apply 1 Application topically 3 (three) times daily. 15 g 0  . olmesartan (BENICAR) 40 MG tablet Take 1 tablet (40 mg total) by mouth daily. 90 tablet 3  . triamcinolone cream (KENALOG) 0.1 % Apply 1 application. topically 2 (two) times daily. For 7-10 days maximum 80 g 0  . doxycycline (VIBRA-TABS) 100 MG tablet Take 1 tablet (100 mg total) by mouth 2 (two) times daily. 14 tablet 0   No facility-administered medications prior to visit.   Past Medical History:  Diagnosis Date  . Allergy   . Boil 12/28/2021   Offered to lance, he declined Will use doxy for impetigo and he will use warm compresses.  . Diverticulitis    leading to approx 1 foot resection-colectomy  . History of kidney stones   . Hx of adenomatous colonic polyps 10/19/2017  . Hypertension   . Impetigo any site 12/28/2021  . Intertrigo 12/28/2021  . NEPHROLITHIASIS, HX OF 03/09/2007  . PONV (postoperative nausea and vomiting)   . SPRAIN/STRAIN, ANKLE NOS 01/05/2007  . Sweaty armpits 12/28/2021   Past Surgical History:  Procedure Laterality Date  . COLECTOMY  09/2008   Dr Excell Seltzer  . COLONOSCOPY    . HERNIA REPAIR  09/18/12   lap incisional hernia repair, related to colectomy  . VENTRAL HERNIA REPAIR N/A  09/18/2012   Allergies  Allergen Reactions  . Benadryl [Diphenhydramine Hcl] Itching    Patient stated allergy after ordered by MD  . Morphine And Related Itching      Objective:    Physical Exam Vitals and nursing note reviewed.  Constitutional:      General: He is not in acute distress.    Appearance: Normal appearance.  HENT:     Head: Normocephalic.     Right Ear: There is impacted cerumen.     Left Ear: There is impacted cerumen.     Nose:     Right Sinus: No maxillary sinus tenderness or frontal sinus tenderness.     Left Sinus: Maxillary sinus tenderness and frontal sinus tenderness present.     Mouth/Throat:     Mouth: Mucous membranes are moist.      Pharynx: Posterior oropharyngeal erythema present. No pharyngeal swelling, oropharyngeal exudate or uvula swelling.     Tonsils: No tonsillar exudate or tonsillar abscesses.  Cardiovascular:     Rate and Rhythm: Normal rate and regular rhythm.  Pulmonary:     Effort: Pulmonary effort is normal.     Breath sounds: Normal breath sounds.  Musculoskeletal:        General: Normal range of motion.     Cervical back: Normal range of motion.  Lymphadenopathy:     Head:     Right side of head: No preauricular or posterior auricular adenopathy.     Left side of head: No preauricular or posterior auricular adenopathy.     Cervical: No cervical adenopathy.  Skin:    General: Skin is warm and dry.     Findings: Lesion (approx 2cm diameter slightly raised healing boil, no head/pus, no erythema, area darker than skin color) present.  Neurological:     Mental Status: He is alert and oriented to person, place, and time.  Psychiatric:        Mood and Affect: Mood normal.   BP 116/79 (BP Location: Left Arm, Patient Position: Sitting, Cuff Size: Large)   Pulse 90   Temp 97.7 F (36.5 C) (Temporal)   Ht '5\' 10"'$  (1.778 m)   Wt 285 lb (129.3 kg)   SpO2 97%   BMI 40.89 kg/m  Wt Readings from Last 3 Encounters:  01/11/22 285 lb (129.3 kg)  10/24/20 280 lb (127 kg)  09/27/20 280 lb (127 kg)       Jeanie Sewer, NP

## 2022-01-14 LAB — POC COVID19 BINAXNOW: SARS Coronavirus 2 Ag: NEGATIVE

## 2022-01-14 LAB — POCT INFLUENZA A/B
Influenza A, POC: NEGATIVE
Influenza B, POC: NEGATIVE

## 2022-01-14 LAB — POCT RAPID STREP A (OFFICE): Rapid Strep A Screen: NEGATIVE

## 2022-02-04 ENCOUNTER — Encounter: Payer: Self-pay | Admitting: *Deleted

## 2022-02-05 ENCOUNTER — Encounter: Payer: BC Managed Care – PPO | Admitting: Family Medicine

## 2022-03-19 ENCOUNTER — Other Ambulatory Visit: Payer: Self-pay | Admitting: Family Medicine

## 2022-04-25 ENCOUNTER — Encounter: Payer: Self-pay | Admitting: *Deleted

## 2022-07-05 ENCOUNTER — Encounter: Payer: BC Managed Care – PPO | Admitting: Family Medicine

## 2022-10-25 ENCOUNTER — Encounter: Payer: Self-pay | Admitting: Family Medicine

## 2022-11-11 IMAGING — DX DG ABDOMEN 1V
2 series · 2 of 2 positions shown · non-contrast
Comparison: None.

CLINICAL DATA: Constipation.

EXAM:
ABDOMEN - 1 VIEW

[abdomen kub (1 of 2)]
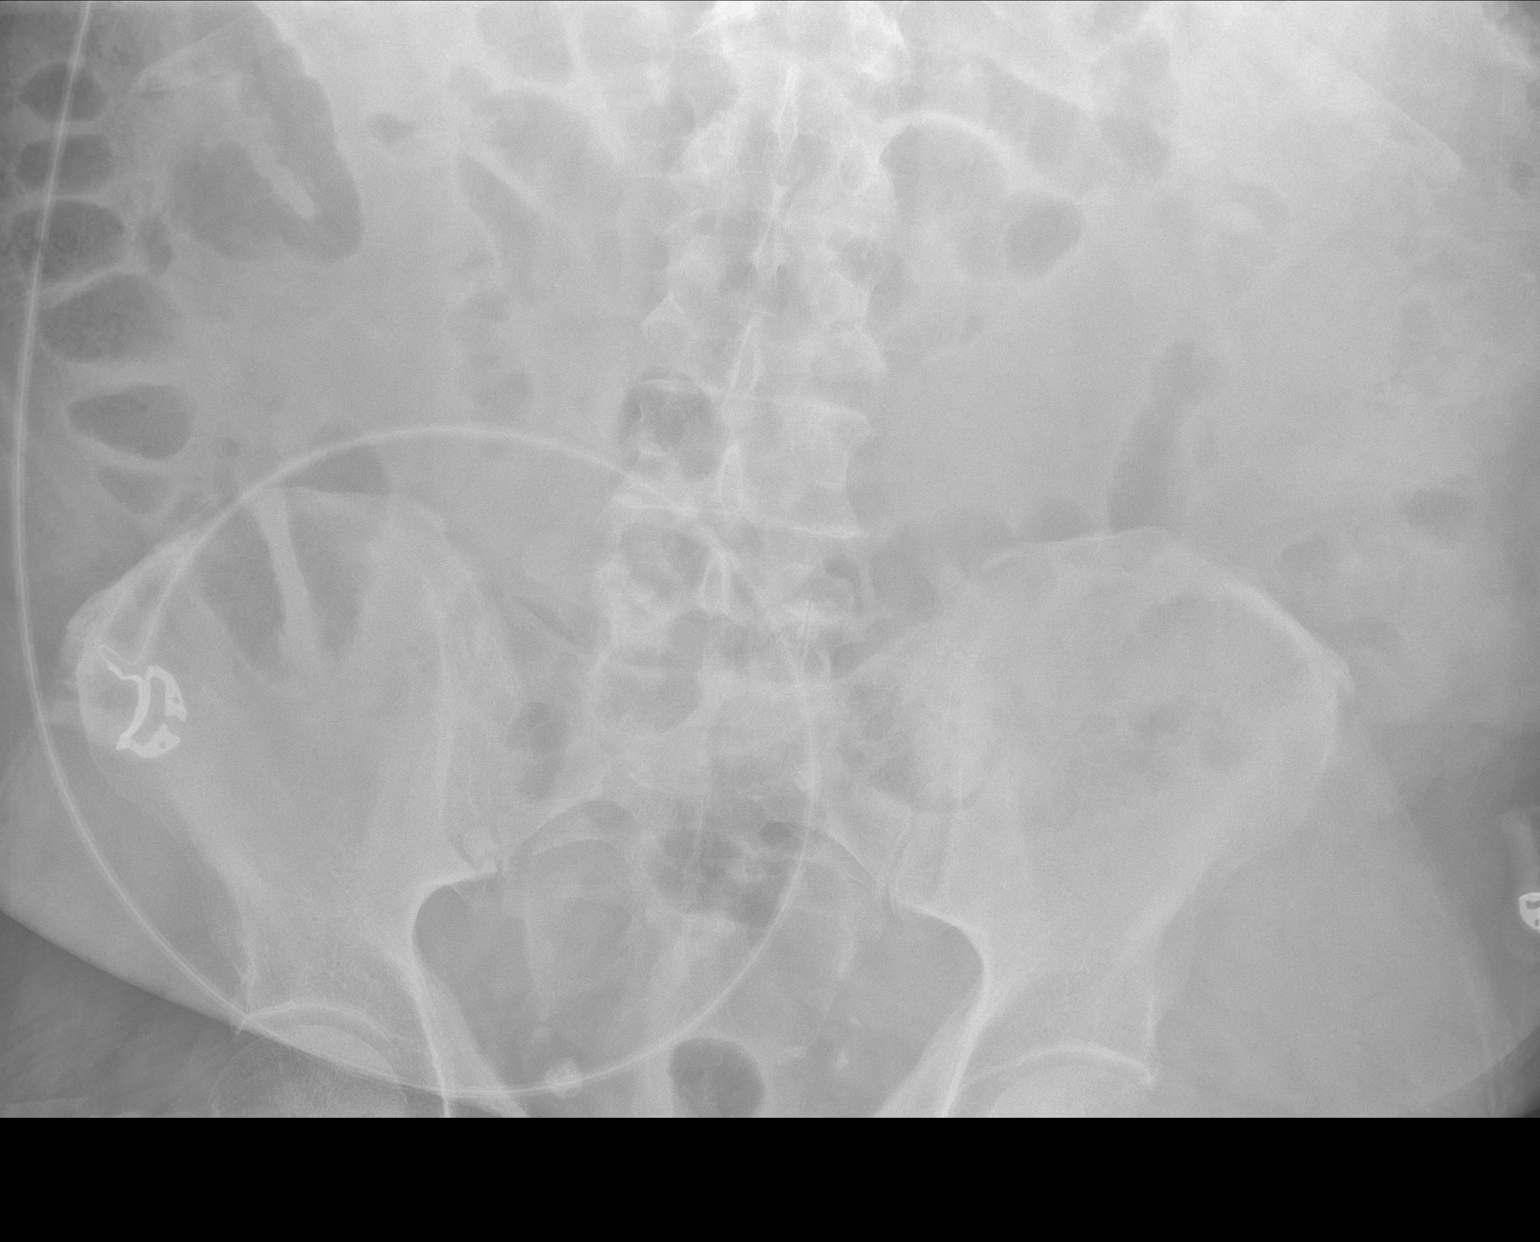

[abdomen kub (2 of 2)]
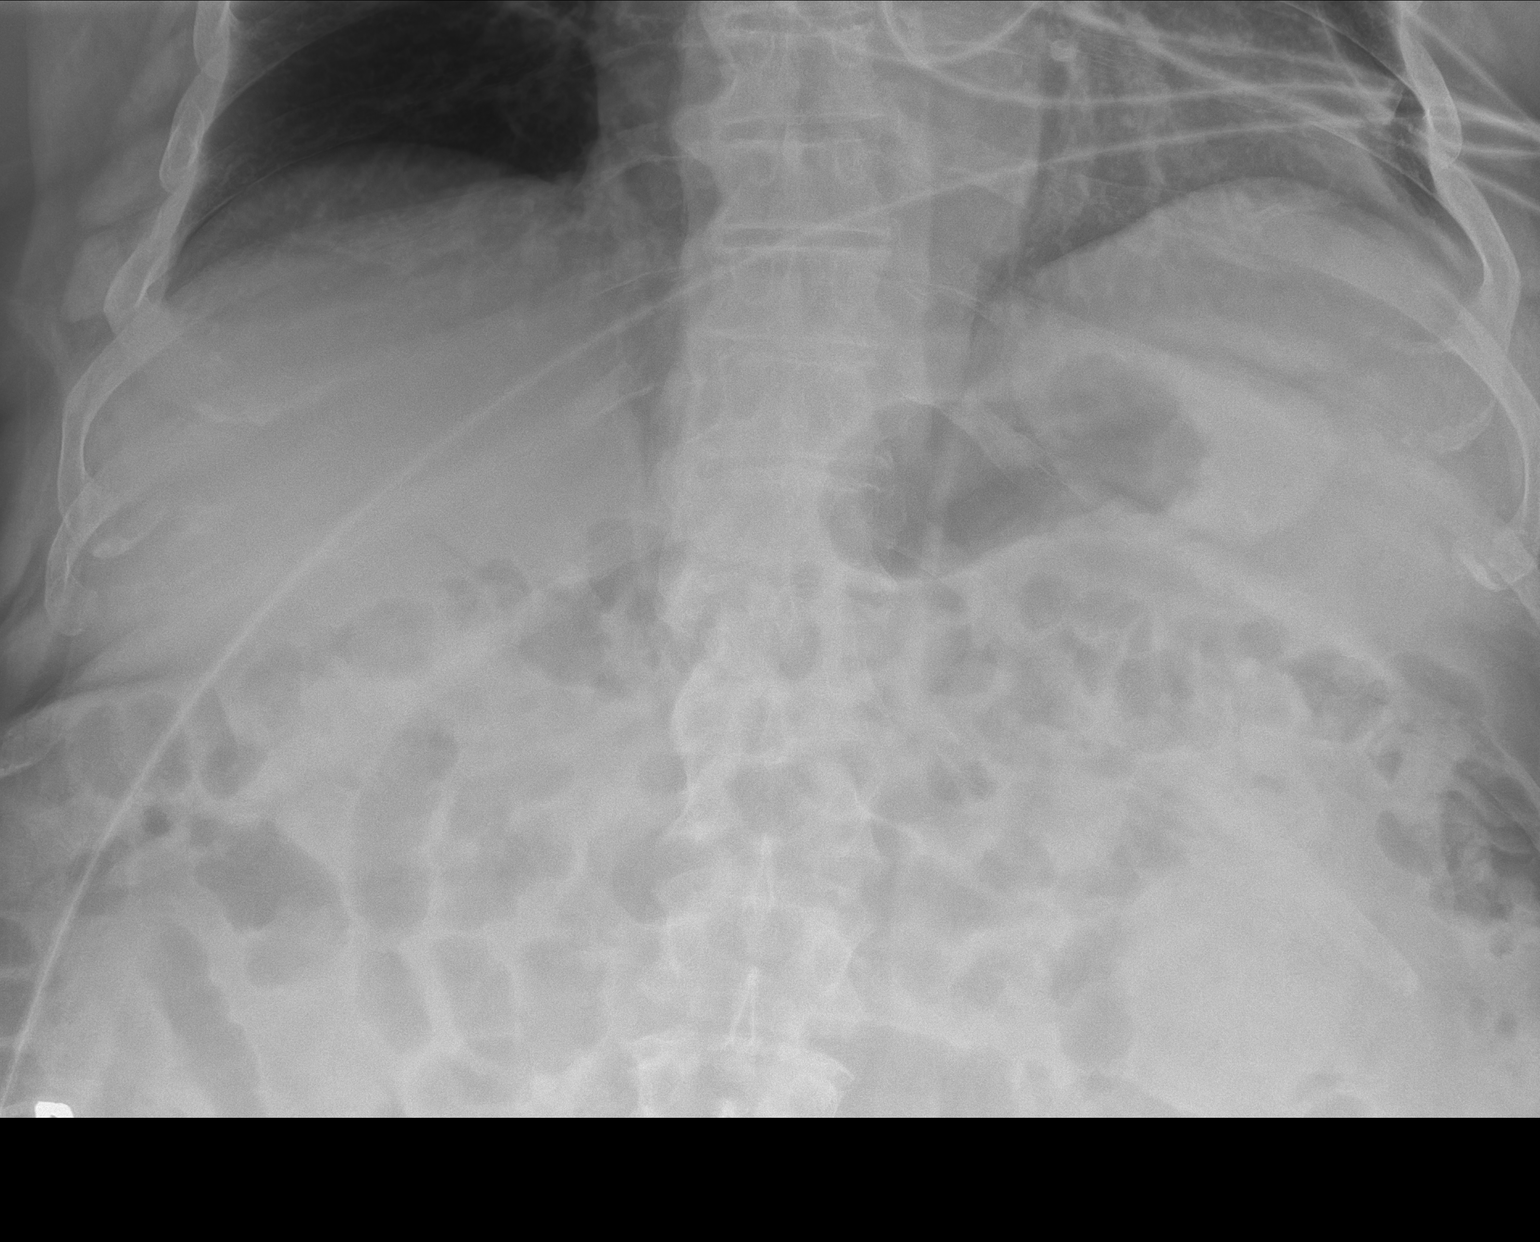

[2 of 2 positions shown; findings below may reference images not displayed]

FINDINGS: The bowel gas pattern is normal. A mild amount of stool is seen
within the descending colon. No radio-opaque calculi or other
significant radiographic abnormality are seen.
IMPRESSION: 1. Mild stool burden without evidence of bowel obstruction.

## 2023-02-25 ENCOUNTER — Other Ambulatory Visit: Payer: Self-pay | Admitting: Family Medicine

## 2023-03-03 ENCOUNTER — Ambulatory Visit: Payer: Self-pay | Admitting: Family Medicine

## 2023-03-06 ENCOUNTER — Ambulatory Visit: Payer: Self-pay | Admitting: Internal Medicine

## 2023-03-13 ENCOUNTER — Ambulatory Visit: Payer: Self-pay | Admitting: Internal Medicine

## 2023-03-17 ENCOUNTER — Ambulatory Visit: Payer: Self-pay | Admitting: Internal Medicine

## 2023-03-24 ENCOUNTER — Encounter: Payer: Self-pay | Admitting: Internal Medicine

## 2023-03-24 ENCOUNTER — Ambulatory Visit: Payer: Managed Care, Other (non HMO) | Admitting: Internal Medicine

## 2023-03-24 VITALS — BP 104/67 | HR 99 | Temp 98.1°F | Ht 70.0 in

## 2023-03-24 DIAGNOSIS — L400 Psoriasis vulgaris: Secondary | ICD-10-CM | POA: Diagnosis not present

## 2023-03-24 DIAGNOSIS — H7391 Unspecified disorder of tympanic membrane, right ear: Secondary | ICD-10-CM | POA: Diagnosis not present

## 2023-03-24 MED ORDER — CIPROFLOXACIN-DEXAMETHASONE 0.3-0.1 % OT SUSP: 4.0000 [drp] | Freq: Two times a day (BID) | OTIC | 0 refills | Status: AC

## 2023-03-24 MED ORDER — CLOBETASOL PROPIONATE 0.05 % EX CREA: 1.0000 | TOPICAL_CREAM | Freq: Two times a day (BID) | CUTANEOUS | 1 refills | Status: DC

## 2023-03-24 MED ORDER — PREDNISONE 20 MG PO TABS: ORAL_TABLET | ORAL | 0 refills | Status: DC

## 2023-03-24 MED ORDER — HYDROCORTISONE 1 % EX OINT: 1.0000 | TOPICAL_OINTMENT | Freq: Two times a day (BID) | CUTANEOUS | 0 refills | Status: DC

## 2023-03-24 MED ORDER — CALCIPOTRIENE 0.005 % EX OINT: TOPICAL_OINTMENT | Freq: Two times a day (BID) | CUTANEOUS | 0 refills | Status: DC

## 2023-03-24 MED ORDER — CLOBETASOL PROPIONATE 0.05 % EX SOLN: 1.0000 | Freq: Two times a day (BID) | CUTANEOUS | 0 refills | Status: DC

## 2023-03-24 MED ORDER — OTEZLA 4 X 10 & 51 X20 MG PO TBPK: 1.0000 | ORAL_TABLET | ORAL | 0 refills | Status: DC

## 2023-03-24 NOTE — Progress Notes (Signed)
[facility]: [phone]   -- Medical Office Visit --  Patient:  [name] [name]      Age: 62 y.o.       Sex:  male  Date:   03/24/2023 Today's Healthcare Provider: Lula Olszewski, MD  ==========================================================================     Assessment & Plan Plaque psoriasis Guttate Psoriasis   He presents with severe, widespread guttate psoriasis, rapidly onset, covering the back, scalp, ears, groin, and buttocks, suggesting an autoimmune origin without joint involvement. Previous attempts at dermatological care were hindered by insurance issues. We discussed various treatment options including topical steroids, calcipotriene, phototherapy, and systemic therapies, highlighting the importance of avoiding alcohol and stress. Systemic therapies such as [place] (apremilast) were noted for his effectiveness but come with high costs and potential insurance barriers. The risks of untreated psoriasis, including joint involvement and hearing impairment, were explained. We will start Otezla (apremilast) oral medication, clobetasol propionate cream for body application, calcipotriene ointment in combination with steroid creams, hydrocortisone cream for the face, and clobetasol propionate solution for the scalp. A stat consultation with dermatology is referred, and advice was given to avoid alcohol and manage stress.  He strongly preferred systemic treatment(s) since he didn't feel like he would be able to apply all the creams so I did prednisone and otezla but encouraged patient to to try to use creams as well.   Disorder of tympanic membrane of right ear Psoriasis Involvement of the Ear   His right ear shows psoriasis involvement, including the eardrum, leading to blockage and an inflamed eardrum at risk of rupture, which could potentially impair hearing. Ciprodex (ciprofloxacin and dexamethasone) ear drops were prescribed to reduce inflammation and prevent  infection, with a mention of the risk of permanent hearing damage if left untreated. We will prescribe Ciprodex ear drops, 4 drops twice a day for 10 days, and refer him to an ENT specialist for further evaluation. A follow-up in one week is scheduled to reassess the ear condition.  Diagnoses and all orders for this visit: Plaque psoriasis -     Ambulatory referral to Dermatology -     clobetasol cream (TEMOVATE) 0.05 %; Apply 1 Application topically 2 (two) times daily. Do not use on genitals or face -     hydrocortisone 1 % ointment; Apply 1 Application topically 2 (two) times daily. For rash on face 14 day max -     predniSONE (DELTASONE) 20 MG tablet; Take 2 pills for 3 days, 1 pill for 4 days -     calcipotriene (DOVONOX) 0.005 % ointment; Apply topically 2 (two) times daily. -     Apremilast (OTEZLA) 4 x 10 & 51 x20 MG TBPK; Take 1 tablet by mouth as directed. -     Ambulatory referral to ENT -     ciprofloxacin-dexamethasone (CIPRODEX) OTIC suspension; Place 4 drops into both ears 2 (two) times daily for 10 days. -     clobetasol (TEMOVATE) 0.05 % external solution; Apply 1 Application topically 2 (two) times daily. Disorder of tympanic membrane of right ear -     Ambulatory referral to ENT -     ciprofloxacin-dexamethasone (CIPRODEX) OTIC suspension; Place 4 drops into both ears 2 (two) times daily for 10 days.  Recommended follow-up: 1 week with me or Primary Care Provider (PCP)-Hunter, Aldine Contes, MD ; also hope to have him seen by dermatology within a week.  ENT referral placed for ear Future Appointments  Date Time Provider Department Center  04/03/2023  1:00 PM Lula Olszewski, MD [facility]  05/22/2023  2:00 PM Shelva Majestic, MD [facility]  Patient Care Team: Shelva Majestic, MD as PCP - General (Family Medicine)    SUBJECTIVE: 62 y.o. male who has HTN (hypertension); Right-sided thoracic back pain; Stress fracture; Diverticulitis; Hx of adenomatous colonic polyps;  Morbid obesity (HCC); Depression, major, single episode, moderate (HCC); Rhinitis, chronic; ETD (Eustachian tube dysfunction), bilateral; Bilateral impacted cerumen; Impetigo any site; Intertrigo; Boil; and Sweaty armpits on their problem list.. Main reasons for visit/main concerns/chief complaint: Rash (All over body.) and Medication Refill (Wellbutrin.)   ------------------------------------------------------------------------------------------------------------------------ AI-Extracted: Discussed the use of AI scribe software for clinical note transcription with the patient, who gave verbal consent to proceed.  History of Present Illness   The patient, with a history of recent retirement and purchasing health insurance independently, presents with a rapidly progressing, pruritic, and scaly rash that has spread extensively over the body within the past two months. The rash, initially localized to the back, has now extended to involve the scalp, face, arms, underarms, back, and groin. The patient also reports involvement of the ears, with dry, scaly skin and subsequent plugging of the right ear leading to muffled hearing.  The patient sought help from a dermatology clinic but was denied service due to insurance issues. The patient has not been exposed to any known triggers or allergens and denies any associated fever, body aches, or joint pain. The patient reports significant distress due to the rash, describing feelings of embarrassment and comparing the experience to being a "leper."  The patient has been attempting to manage the itching with back scratchers and a wooden spoon but reports breaking two back scratchers due to the severity of the itch. The patient also reports a desire to get a haircut but has been too embarrassed to visit a salon due to the scalp involvement.  The patient has not been in contact with anyone who could assist with the application of topical treatments, particularly to  the back. The patient also reports a history of alcohol consumption but denies heavy drinking. The patient has not been on any new medications and denies any known allergies.       Note that patient  has a past medical history of Allergy, Boil (12/28/2021), Diverticulitis, History of kidney stones, adenomatous colonic polyps (10/19/2017), Hypertension, Impetigo any site (12/28/2021), Intertrigo (12/28/2021), NEPHROLITHIASIS, HX OF (03/09/2007), PONV (postoperative nausea and vomiting), SPRAIN/STRAIN, ANKLE NOS (01/05/2007), and Sweaty armpits (12/28/2021).  Problem list overviews that were updated at today's visit:No problems updated.  Med reconciliation: Current Outpatient Medications on File Prior to Visit  Medication Sig   acetaminophen (TYLENOL) 500 MG tablet Take 500 mg by mouth every 6 (six) hours as needed for pain.   aluminum chloride (DRYSOL) 20 % external solution Apply topically at bedtime.   amLODipine (NORVASC) 10 MG tablet TAKE 1 TABLET(10 MG) BY MOUTH DAILY   buPROPion (WELLBUTRIN XL) 150 MG 24 hr tablet TAKE 1 TABLET(150 MG) BY MOUTH DAILY   ibuprofen (ADVIL,MOTRIN) 200 MG tablet Take 200 mg by mouth every 6 (six) hours as needed.   nystatin (MYCOSTATIN/NYSTOP) powder Apply 1 Application topically 3 (three) times daily.   olmesartan (BENICAR) 40 MG tablet TAKE 1 TABLET(40 MG) BY MOUTH DAILY   doxycycline (VIBRA-TABS) 100 MG tablet Take 1 tablet (100 mg total) by mouth 2 (two) times daily. (Patient not taking: Reported on 03/24/2023)   triamcinolone cream (KENALOG) 0.1 % Apply 1 application. topically 2 (two)  times daily. For 7-10 days maximum (Patient not taking: Reported on 03/24/2023)   No current facility-administered medications on file prior to visit.  There are no discontinued medications.   Objective   Physical Exam     03/24/2023    1:07 PM 01/11/2022   11:38 AM 12/28/2021    1:34 PM  Vitals with BMI  Height 5\' 10"  5\' 10"  5\' 10"   Weight -- 285 lbs --  BMI  40.89    Systolic 104 116 161  Diastolic 67 79 78  Pulse 99 90 105   Wt Readings from Last 10 Encounters:  01/11/22 285 lb (129.3 kg)  10/24/20 280 lb (127 kg)  09/27/20 280 lb (127 kg)  08/04/20 (!) 305 lb (138.3 kg)  08/03/20 295 lb (133.8 kg)  07/25/20 (!) 305 lb (138.3 kg)  07/25/20 (!) 305 lb 8 oz (138.6 kg)  12/21/19 (!) 309 lb 9.6 oz (140.4 kg)  09/28/18 270 lb (122.5 kg)  03/10/18 290 lb (131.5 kg)   Vital signs reviewed.  Nursing notes reviewed. Weight trend reviewed. Abnormalities and Problem-Specific physical exam findings:   Photographs Taken 03/24/2023 :         General Appearance:  No acute distress appreciable.   Well-groomed, healthy-appearing male.  Well proportioned with no abnormal fat distribution.  Good muscle tone. Pulmonary:  Normal work of breathing at rest, no respiratory distress apparent. SpO2: 94 %  Musculoskeletal: All extremities are intact.  Neurological:  Awake, alert, oriented, and engaged.  No obvious focal neurological deficits or cognitive impairments.  Sensorium seems unclouded.   Speech is clear and coherent with logical content. Psychiatric:  Appropriate mood, pleasant and cooperative demeanor, thoughtful and engaged during the exam  Results            No results found for any visits on 03/24/23.  No visits with results within 1 Year(s) from this visit.  Latest known visit with results is:  Office Visit on 01/11/2022  Component Date Value   SARS Coronavirus 2 Ag 01/14/2022 Negative    Rapid Strep A Screen 01/14/2022 Negative    Influenza A, POC 01/14/2022 Negative    Influenza B, POC 01/14/2022 Negative    No image results found.   No results found.  CT Renal Stone Study  Result Date: 08/03/2020 CLINICAL DATA:  Flank pain EXAM: CT ABDOMEN AND PELVIS WITHOUT CONTRAST TECHNIQUE: Multidetector CT imaging of the abdomen and pelvis was performed following the standard protocol without IV contrast. COMPARISON:  None. FINDINGS: Lower chest:  The visualized heart size within normal limits. No pericardial fluid/thickening. No hiatal hernia. The visualized portions of the lungs are clear. Hepatobiliary: Although limited due to the lack of intravenous contrast, normal in appearance without gross focal abnormality. No evidence of calcified gallstones or biliary ductal dilatation. Pancreas:  Unremarkable.  No surrounding inflammatory changes. Spleen: Normal in size. Although limited due to the lack of intravenous contrast, normal in appearance. Adrenals/Urinary Tract: Both adrenal glands appear normal. The kidneys and collecting system appear normal without evidence of urinary tract calculus or hydronephrosis. Bladder is unremarkable. Stomach/Bowel: The stomach, small bowel, and colon are normal in appearance. No inflammatory changes or obstructive findings. Scattered colonic diverticula are noted. Appendix is normal. Vascular/Lymphatic: There are no enlarged abdominal or pelvic lymph nodes. Scattered aortic atherosclerotic calcifications are seen without aneurysmal dilatation. Reproductive: The prostate is unremarkable. Other: No evidence of abdominal wall mass or hernia. Musculoskeletal: No acute or significant osseous findings. IMPRESSION: No acute intra-abdominal or pelvic  pathology to explain the patient's symptoms Aortic Atherosclerosis (ICD10-I70.0). Electronically Signed   By: Jonna Clark M.D.   On: 08/03/2020 23:30         Additional Info: This encounter employed real-time, collaborative documentation. The patient actively reviewed and updated their medical record on a shared screen, ensuring transparency and facilitating joint problem-solving for the problem list, overview, and plan. This approach promotes accurate, informed care. The treatment plan was discussed and reviewed in detail, including medication safety, potential side effects, and all patient questions. We confirmed understanding and comfort with the plan. Follow-up instructions  were established, including contacting the office for any concerns, returning if symptoms worsen, persist, or new symptoms develop, and precautions for potential emergency department visits.

## 2023-03-24 NOTE — Patient Instructions (Signed)
VISIT SUMMARY:  During today's visit, we discussed your rapidly progressing, itchy, and scaly rash that has spread over your body, including your scalp, face, arms, underarms, back, and groin. We also addressed the involvement of your ears, which has led to muffled hearing. We reviewed your treatment options and prescribed medications to help manage your symptoms. A follow-up appointment is scheduled for next week to reassess your condition.  YOUR PLAN:  -GUTTATE PSORIASIS: Guttate psoriasis is an autoimmune condition that causes small, red, scaly spots on the skin. We will start you on Otezla (apremilast) oral medication, clobetasol propionate cream for your body, calcipotriene ointment in combination with steroid creams, hydrocortisone cream for your face, and clobetasol propionate solution for your scalp. It is important to avoid alcohol and manage stress. A stat consultation with dermatology has been referred.  -PSORIASIS INVOLVEMENT OF THE EAR: Psoriasis can also affect the ears, leading to blockage and inflammation, which can impair hearing. We have prescribed Ciprodex ear drops, 4 drops twice a day for 10 days, to reduce inflammation and prevent infection. You will also be referred to an ENT specialist for further evaluation.  INSTRUCTIONS:  Please follow up in one week to reassess your ear condition and overall management of psoriasis. Ensure the stat dermatology referral is processed and follow up on the appointment scheduling. Contact our office if you encounter any issues with prescription fulfillment or insurance authorization.

## 2023-03-25 ENCOUNTER — Encounter: Payer: Self-pay | Admitting: Internal Medicine

## 2023-03-27 ENCOUNTER — Other Ambulatory Visit (HOSPITAL_COMMUNITY): Payer: Self-pay

## 2023-03-28 ENCOUNTER — Other Ambulatory Visit (HOSPITAL_COMMUNITY): Payer: Self-pay

## 2023-03-28 ENCOUNTER — Telehealth: Payer: Self-pay

## 2023-03-28 NOTE — Telephone Encounter (Signed)
Informed patient of this denial and the reason.

## 2023-03-28 NOTE — Telephone Encounter (Signed)
Pharmacy Patient Advocate Encounter  Received notification from [employer] that Prior Authorization for OTEZLA has been DENIED.  Full denial letter will be uploaded to the media tab. See denial reason below.   PA #/Case ID/Reference #: [id number]   DENIAL REASON: Did not meet the necessary criteria:   PLAQUE PSORIASIS. A. Initial Therapy - ALL of the following (i, ii, and iii): i. 64 years of age or older; AND ii. ONE of the following (a or b): a. tried at least one traditional systemic agent for psoriasis for at least 3 months, unless intolerant (a biologic counts); OR b. contraindication to methotrexate; AND iii. prescribed by or in consultation with a dermatologist. B. Currently Receiving - ALL of the following (i, ii, and iii): i. established on therapy for at least 4 months; AND ii. experienced a beneficial clinical response, defined as improvement from baseline (prior to initiating the requested drug) in at least one of the following: estimated body surface area, erythema, induration/thickness, and/or scale of areas affected by psoriasis; AND iii. experienced an improvement in at least one symptom, such as decreased pain, itching, and/or burning.

## 2023-03-28 NOTE — Telephone Encounter (Signed)
Spoke with PA team from [facility], Jesus informed us that the pt will need to try 3 medications first for approval:  1) Methotrexate 2) Cyclosporine 3) Acitretin  Within the past 3 months.  PA # [id number]

## 2023-03-28 NOTE — Telephone Encounter (Signed)
Pharmacy Patient Advocate Encounter   Received notification from Physician's Office that prior authorization for Otezla 4 x 10 & 51 x20MG  tablets is required/requested.   Insurance verification completed.   The patient is insured through [employer] .   Per test claim: PA required; PA submitted to above mentioned insurance via CoverMyMeds Key/confirmation #/EOC [facility] Status is pending

## 2023-04-02 ENCOUNTER — Encounter (INDEPENDENT_AMBULATORY_CARE_PROVIDER_SITE_OTHER): Payer: Self-pay | Admitting: Otolaryngology

## 2023-04-03 ENCOUNTER — Ambulatory Visit: Payer: Managed Care, Other (non HMO) | Admitting: Internal Medicine

## 2023-04-03 NOTE — Addendum Note (Signed)
Addended by: Lula Olszewski on: 04/03/2023 12:28 PM   Modules accepted: Orders

## 2023-04-16 ENCOUNTER — Encounter: Payer: Self-pay | Admitting: Otolaryngology

## 2023-04-21 ENCOUNTER — Ambulatory Visit: Payer: Managed Care, Other (non HMO) | Admitting: Internal Medicine

## 2023-05-01 ENCOUNTER — Ambulatory Visit: Payer: Commercial Managed Care - HMO | Admitting: Internal Medicine

## 2023-05-01 ENCOUNTER — Telehealth: Payer: Self-pay | Admitting: Family Medicine

## 2023-05-01 NOTE — Telephone Encounter (Signed)
Copied from CRM [id number]. Topic: General - Other >> May 01, 2023 12:21 PM Melissa C wrote: Reason for CRM: While on the phone with patient to reschedule appointment, he stated he had inquired about a dermatology referral and hadn't heard anything from dermatologist. He is also wondering if he needs a new prescription eczema cream in the interim as the one he is using is not working adequately.

## 2023-05-01 NOTE — Telephone Encounter (Signed)
[phone]. [facility] [address] [place] [address]

## 2023-05-01 NOTE — Telephone Encounter (Signed)
Called and lm for pt tcb. It appears someone reached out from Dermatology this morning and lm for pt tcb. When pt returns call please have him check his voicemail for information from Dermatology

## 2023-05-01 NOTE — Telephone Encounter (Signed)
Pt has been scheduled with derm on 01/25.

## 2023-05-09 ENCOUNTER — Ambulatory Visit: Payer: Commercial Managed Care - HMO | Admitting: Internal Medicine

## 2023-05-19 ENCOUNTER — Ambulatory Visit: Payer: Commercial Managed Care - HMO | Admitting: Dermatology

## 2023-05-21 ENCOUNTER — Ambulatory Visit: Payer: Commercial Managed Care - HMO | Admitting: Internal Medicine

## 2023-05-22 ENCOUNTER — Encounter: Payer: Self-pay | Admitting: Family Medicine

## 2023-05-28 ENCOUNTER — Encounter: Payer: Self-pay | Admitting: Dermatology

## 2023-05-28 ENCOUNTER — Ambulatory Visit: Payer: Commercial Managed Care - HMO | Admitting: Dermatology

## 2023-05-28 VITALS — BP 139/91

## 2023-05-28 DIAGNOSIS — L408 Other psoriasis: Secondary | ICD-10-CM | POA: Diagnosis not present

## 2023-05-28 DIAGNOSIS — Z7189 Other specified counseling: Secondary | ICD-10-CM

## 2023-05-28 DIAGNOSIS — L539 Erythematous condition, unspecified: Secondary | ICD-10-CM

## 2023-05-28 DIAGNOSIS — Z79899 Other long term (current) drug therapy: Secondary | ICD-10-CM | POA: Diagnosis not present

## 2023-05-28 DIAGNOSIS — L409 Psoriasis, unspecified: Secondary | ICD-10-CM

## 2023-05-28 MED ORDER — TRIAMCINOLONE ACETONIDE 0.1 % EX OINT: 1.0000 | TOPICAL_OINTMENT | Freq: Two times a day (BID) | CUTANEOUS | 2 refills | Status: DC

## 2023-05-28 MED ORDER — COSENTYX UNOREADY 300 MG/2ML ~~LOC~~ SOAJ: 300.0000 mg | SUBCUTANEOUS | 12 refills | Status: DC

## 2023-05-28 MED ORDER — COSENTYX UNOREADY 300 MG/2ML ~~LOC~~ SOAJ: 300.0000 mg | SUBCUTANEOUS | 0 refills | Status: DC

## 2023-05-28 MED ORDER — SECUKINUMAB 300 MG/2ML ~~LOC~~ SOAJ
300.0000 mg | Freq: Once | SUBCUTANEOUS | Status: AC
  Administered 2023-05-28: 300 mg via SUBCUTANEOUS

## 2023-05-28 NOTE — Progress Notes (Signed)
 New Patient Visit   Subjective  [name] [name] is a 63 y.o. male who presents for the following: Eczema vs psoriasis - It is all over his body and spreading "like wildfire". It started in November. He was given Clobetasol  and Calcipotriene . He was also given prednisone . It got a little better while on prednisone  then flared worse when he was off .His PCP prescribed Otezla but it was denied. History of depression. No history of IBD or Crohn's.  The following portions of the chart were reviewed this encounter and updated as appropriate: medications, allergies, medical history  Review of Systems:  No other skin or systemic complaints except as noted in HPI or Assessment and Plan.  Objective  Well appearing patient in no apparent distress; mood and affect are within normal limits.   A focused examination was performed of the following areas: face, back, legs, arms   Relevant exam findings are noted in the Assessment and Plan.    Assessment & Plan   ERYTHRODERMIC PSORIASIS Exam: Well-demarcated erythematous papules/plaques with silvery scale, guttate pink scaly papules. >80% BSA.  Chronic and persistent condition with duration or expected duration over one year. Condition is symptomatic/ bothersome to patient. Not currently at goal.      patient denies joint pain  Psoriasis is a chronic non-curable, but treatable genetic/hereditary disease that may have other systemic features affecting other organ systems such as joints (Psoriatic Arthritis). It is associated with an increased risk of inflammatory bowel disease, heart disease, non-alcoholic fatty liver disease, and depression.  Treatments include light and laser treatments; topical medications; and systemic medications including oral and injectables.  The patient was seen for counseling regarding their diagnosis of erythrodermic psoriasis, a severe and generalized form of psoriasis that has resulted in widespread redness, scaling,  and inflammation across most of their body. We discussed the nature of erythrodermic psoriasis, emphasizing that it is a rare but serious condition that requires prompt and aggressive treatment to manage the extensive skin involvement and prevent complications such as infection, dehydration, and electrolyte imbalances. The patient was informed that erythrodermic psoriasis can be triggered by factors such as infections, medications, or abrupt withdrawal of systemic therapies, and it may require hospitalization for stabilization in certain cases. We reviewed treatment options, including the use of systemic therapies such as biologics (e.g., TNF inhibitors, IL-17 inhibitors, or IL-23 inhibitors), oral immunosuppressive agents (e.g., methotrexate, cyclosporine), and phototherapy. I also explained the importance of close monitoring for potential side effects from these medications and discussed the necessity of regular follow-up visits for assessing disease activity, treatment response, and possible complications. The patient was encouraged to monitor for signs of infection or worsening symptoms and to seek immediate care if any new or severe symptoms arise. We discussed lifestyle adjustments, including skin care routines, the importance of avoiding triggers, and strategies to manage stress, as stress can exacerbate psoriasis. The patient was provided with education on the chronic nature of the disease and the need for long-term management to control flare-ups. A comprehensive treatment plan was discussed, and the patient was scheduled for follow-up in 4 weeks to assess response to therapy and adjust the plan as necessary.  Treatment Plan:  Advised patient he is not a candidate for Otezla due to his history of depression. Advised him he should not take prednisone  in the future as it can cause a rebound flare of psoriasis once he stops.  Due to increased inflammation, advised patient if he starts feeling bad or feels  cold  and can't get warm, go to the ER. Advised he may need in hospital wet wraps. Discussed treatment options including biologics.  Cosentyx  300 mg/2 ml injection given today. Patient taught to self inject today. Will plan to continue his loading with samples until he is approved for medication. Lab order given today and patient will go to [facility] to have labs drawn in the morning. Advised patient once we have his lab results and they are WNL, he can come pick up the remaining injections for his loading dose.  Pt is not a candidate for methotrexate or cyclosporine due to inability for follow up labs and contraindications with other medications. Pt has no access to a light box for phototherapy.  Due to the progressive and chronic nature of her psoriasis, patient has tried and failed numerous topicals creams as noted above, the next best therapeutic option is a systemic therapy. It is medically necessary to help improve his quality of life.    Start TMC 0.1% ointment twice daily to affected areas for bridge until therapy begins  High Risk Medication Monitoring- Cosentyx   PSORIASIS   Related Procedures Comprehensive metabolic panel CBC with Differential/Platelet Hepatitis B surface antibody,qualitative Hepatitis B surface antigen Hepatitis C antibody QuantiFERON-TB Gold Plus Related Medications Secukinumab  SOAJ 300 mg   Return in about 4 years (around 05/28/2027) for Psoriasis.  I, Eliot Guernsey, CMA, am acting as scribe for Deneise Finlay, MD .  We spent 60 min reviewing records, taking the patient history, providing face to face care with the patient, sending prescriptions.  Documentation: I have reviewed the above documentation for accuracy and completeness, and I agree with the above.  Deneise Finlay, MD

## 2023-05-28 NOTE — Patient Instructions (Signed)

## 2023-06-02 ENCOUNTER — Encounter: Payer: Self-pay | Admitting: Internal Medicine

## 2023-06-04 ENCOUNTER — Ambulatory Visit: Payer: Commercial Managed Care - HMO

## 2023-06-04 DIAGNOSIS — L409 Psoriasis, unspecified: Secondary | ICD-10-CM | POA: Diagnosis not present

## 2023-06-04 LAB — QUANTIFERON-TB GOLD PLUS
QuantiFERON Mitogen Value: >10 [IU]/mL
QuantiFERON Nil Value: 0.06 [IU]/mL
QuantiFERON TB1 Ag Value: 0.07 [IU]/mL
QuantiFERON TB2 Ag Value: 0.08 [IU]/mL
QuantiFERON-TB Gold Plus: NEGATIVE

## 2023-06-04 LAB — CBC WITH DIFFERENTIAL/PLATELET
Basophils Absolute: 0.1 10*3/uL (ref 0.0–0.2)
Basos: 1 %
EOS (ABSOLUTE): 0.3 10*3/uL (ref 0.0–0.4)
Eos: 5 %
Hematocrit: 48.5 % (ref 37.5–51.0)
Hemoglobin: 16.1 g/dL (ref 13.0–17.7)
Immature Grans (Abs): 0 10*3/uL (ref 0.0–0.1)
Immature Granulocytes: 1 %
Lymphocytes Absolute: 1.9 10*3/uL (ref 0.7–3.1)
Lymphs: 29 %
MCH: 34.7 pg — ABNORMAL HIGH (ref 26.6–33.0)
MCHC: 33.2 g/dL (ref 31.5–35.7)
MCV: 105 fL — ABNORMAL HIGH (ref 79–97)
Monocytes Absolute: 0.6 10*3/uL (ref 0.1–0.9)
Monocytes: 9 %
Neutrophils Absolute: 3.7 10*3/uL (ref 1.4–7.0)
Neutrophils: 55 %
Platelets: 235 10*3/uL (ref 150–450)
RBC: 4.64 x10E6/uL (ref 4.14–5.80)
RDW: 13.1 % (ref 11.6–15.4)
WBC: 6.6 10*3/uL (ref 3.4–10.8)

## 2023-06-04 LAB — COMPREHENSIVE METABOLIC PANEL
ALT: 18 [IU]/L (ref 0–44)
AST: 20 [IU]/L (ref 0–40)
Albumin: 3.8 g/dL — ABNORMAL LOW (ref 3.9–4.9)
Alkaline Phosphatase: 70 [IU]/L (ref 44–121)
BUN/Creatinine Ratio: 9 — ABNORMAL LOW (ref 10–24)
BUN: 8 mg/dL (ref 8–27)
Bilirubin Total: 0.5 mg/dL (ref 0.0–1.2)
CO2: 19 mmol/L — ABNORMAL LOW (ref 20–29)
Calcium: 8.8 mg/dL (ref 8.6–10.2)
Chloride: 103 mmol/L (ref 96–106)
Creatinine, Ser: 0.9 mg/dL (ref 0.76–1.27)
Globulin, Total: 2.8 g/dL (ref 1.5–4.5)
Glucose: 113 mg/dL — ABNORMAL HIGH (ref 70–99)
Potassium: 4.5 mmol/L (ref 3.5–5.2)
Sodium: 140 mmol/L (ref 134–144)
Total Protein: 6.6 g/dL (ref 6.0–8.5)
eGFR: 95 mL/min/{1.73_m2} (ref >59)

## 2023-06-04 LAB — HEPATITIS C ANTIBODY: Hep C Virus Ab: NONREACTIVE

## 2023-06-04 LAB — HEPATITIS B SURFACE ANTIBODY,QUALITATIVE: Hep B Surface Ab, Qual: NONREACTIVE

## 2023-06-04 LAB — HEPATITIS B SURFACE ANTIGEN: Hepatitis B Surface Ag: NEGATIVE

## 2023-06-04 MED ORDER — SECUKINUMAB 300 MG/2ML ~~LOC~~ SOAJ
300.0000 mg | Freq: Once | SUBCUTANEOUS | Status: AC
  Administered 2023-06-04: 300 mg via SUBCUTANEOUS

## 2023-06-04 NOTE — Progress Notes (Signed)
   Follow-Up Visit   Subjective  DASHON CONFER is a 63 y.o. male who presents for the following: Cosentyx injection training.  The following portions of the chart were reviewed this encounter and updated as appropriate: medications, allergies, medical history  Review of Systems:  No other skin or systemic complaints except as noted in HPI or Assessment and Plan.  Objective  Well appearing patient in no apparent distress; mood and affect are within normal limits.  A focused examination was performed of the following areas:  Leg and abdomen   Relevant exam findings are noted in the Assessment and Plan.    Assessment & Plan    Patient stated he hasn't seen any improvement yet. PSORIASIS Left Abdomen (side) - Upper, Left Thigh - Anterior, Right Abdomen (side) - Upper, Right Thigh - Anterior Related Medications Secukinumab SOAJ 300 mg   No follow-ups on file.  Dominga Ferry, Surg Tech III, am acting as scribe for [employer].   Documentation: I have reviewed the above documentation for accuracy and completeness, and I agree with the above.  [facility]-NURSE ROOM

## 2023-06-05 ENCOUNTER — Telehealth: Payer: Self-pay

## 2023-06-05 NOTE — Telephone Encounter (Signed)
Advised patients labs are WNL and he is ok to continue Cosentyx. He will come to office next week to pick up samples to finish his loading dose. He says he received a notification that his medication has been approved.

## 2023-06-05 NOTE — Telephone Encounter (Signed)
-----   Message from [employer] [facility] [employer] PACI sent at 06/05/2023  9:54 AM EST ----- Bloodwork stable. Quantiferon Gold negative. Ok to continue Cosentyx

## 2023-06-11 ENCOUNTER — Other Ambulatory Visit: Payer: Self-pay

## 2023-06-11 MED ORDER — AMLODIPINE BESYLATE 10 MG PO TABS: 10.0000 mg | ORAL_TABLET | Freq: Every day | ORAL | 3 refills | Status: DC

## 2023-06-11 MED ORDER — BUPROPION HCL ER (XL) 150 MG PO TB24: ORAL_TABLET | ORAL | 3 refills | Status: AC

## 2023-06-25 ENCOUNTER — Ambulatory Visit: Payer: Commercial Managed Care - HMO | Admitting: Dermatology

## 2023-07-09 ENCOUNTER — Ambulatory Visit: Payer: Commercial Managed Care - HMO | Admitting: Dermatology

## 2023-07-15 ENCOUNTER — Ambulatory Visit: Payer: Commercial Managed Care - HMO | Admitting: Dermatology

## 2023-07-28 ENCOUNTER — Ambulatory Visit: Admitting: Dermatology

## 2023-07-28 ENCOUNTER — Encounter: Payer: Self-pay | Admitting: Dermatology

## 2023-07-28 VITALS — BP 123/70 | HR 96

## 2023-07-28 DIAGNOSIS — L409 Psoriasis, unspecified: Secondary | ICD-10-CM

## 2023-07-28 DIAGNOSIS — L810 Postinflammatory hyperpigmentation: Secondary | ICD-10-CM | POA: Diagnosis not present

## 2023-07-28 MED ORDER — SECUKINUMAB 150 MG/ML ~~LOC~~ SOAJ
300.0000 mg | Freq: Once | SUBCUTANEOUS | Status: AC
  Administered 2023-07-28: 300 mg via SUBCUTANEOUS

## 2023-07-28 NOTE — Patient Instructions (Signed)

## 2023-07-28 NOTE — Progress Notes (Signed)
   Follow-Up Visit   Subjective  [name] [name] is a 63 y.o. male who presents for the following: psoriasis Pt here to follow up on psoriasis. He started Cosentyx 06/04/2023, he has had 5 injections so far. He states he has noticed significant improvement and doesn't have itchiness. He feels things have improved since his last visit.   The following portions of the chart were reviewed this encounter and updated as appropriate: medications, allergies, medical history  Review of Systems:  No other skin or systemic complaints except as noted in HPI or Assessment and Plan.  Objective  Well appearing patient in no apparent distress; mood and affect are within normal limits.  A focused examination was performed of the following areas: All over body  Relevant exam findings are noted in the Assessment and Plan.           Assessment & Plan   POST-INFLAMMATORY HYPERPIGMENTATION (PIH) Exam: hyperpigmented macules and/or patches at face   This is a benign condition that comes from having previous inflammation in the skin and will fade with time over months to sometimes years. Recommend daily sun protection including sunscreen SPF 30+ to sun-exposed areas. - Recommend treating any itchy or red areas on the skin quickly to prevent new areas of PIH. Treating with prescription medicines such as hydroquinone may help fade dark spots faster.    Treatment Plan:  Monitor for changes   PSORIASIS Exam: Well-demarcated erythematous papules/plaques with silvery scale, guttate pink scaly papules   Chronic and persistent condition with duration or expected duration over one year. Condition is improving with treatment but not currently at goal.   Psoriasis is a chronic non-curable, but treatable genetic/hereditary disease that may have other systemic features affecting other organ systems such as joints (Psoriatic Arthritis). It is associated with an increased risk of inflammatory bowel disease,  heart disease, non-alcoholic fatty liver disease, and depression.  Treatments include light and laser treatments; topical medications; and systemic medications including oral and injectables.  Treatment Plan: Samples given of cosentyx lot 07/10/25 lot [facility]  Continue cosentyx q4 weeks injections PSORIASIS Right Thigh - Anterior Related Medications Secukinumab SOAJ 300 mg   Return in about 2 months (around 09/27/2023) for PSORIASIS.  I, Tillie Fantasia, CMA, am acting as scribe for Gwenith Daily, MD.   Documentation: I have reviewed the above documentation for accuracy and completeness, and I agree with the above.  Gwenith Daily, MD

## 2023-07-29 ENCOUNTER — Other Ambulatory Visit: Payer: Self-pay

## 2023-07-29 ENCOUNTER — Observation Stay (HOSPITAL_COMMUNITY)
Admission: EM | Admit: 2023-07-29 | Discharge: 2023-07-31 | Disposition: A | Discharge status: Home / Self Care | Attending: Emergency Medicine | Admitting: Emergency Medicine

## 2023-07-29 ENCOUNTER — Emergency Department (HOSPITAL_COMMUNITY)

## 2023-07-29 ENCOUNTER — Encounter (HOSPITAL_COMMUNITY): Payer: Self-pay

## 2023-07-29 DIAGNOSIS — R0789 Other chest pain: Secondary | ICD-10-CM | POA: Diagnosis present

## 2023-07-29 DIAGNOSIS — I4891 Unspecified atrial fibrillation: Principal | ICD-10-CM | POA: Diagnosis present

## 2023-07-29 DIAGNOSIS — Z87891 Personal history of nicotine dependence: Secondary | ICD-10-CM | POA: Insufficient documentation

## 2023-07-29 DIAGNOSIS — I10 Essential (primary) hypertension: Secondary | ICD-10-CM | POA: Insufficient documentation

## 2023-07-29 DIAGNOSIS — I25119 Atherosclerotic heart disease of native coronary artery with unspecified angina pectoris: Secondary | ICD-10-CM | POA: Diagnosis not present

## 2023-07-29 DIAGNOSIS — Z9049 Acquired absence of other specified parts of digestive tract: Secondary | ICD-10-CM | POA: Insufficient documentation

## 2023-07-29 DIAGNOSIS — Z79899 Other long term (current) drug therapy: Secondary | ICD-10-CM | POA: Insufficient documentation

## 2023-07-29 DIAGNOSIS — I2584 Coronary atherosclerosis due to calcified coronary lesion: Secondary | ICD-10-CM | POA: Insufficient documentation

## 2023-07-29 DIAGNOSIS — F341 Dysthymic disorder: Secondary | ICD-10-CM | POA: Diagnosis not present

## 2023-07-29 DIAGNOSIS — I3139 Other pericardial effusion (noninflammatory): Secondary | ICD-10-CM | POA: Diagnosis not present

## 2023-07-29 LAB — I-STAT CHEM 8, ED
BUN: 16 mg/dL (ref 8–23)
Calcium, Ion: 1.12 mmol/L — ABNORMAL LOW (ref 1.15–1.40)
Chloride: 105 mmol/L (ref 98–111)
Creatinine, Ser: 0.9 mg/dL (ref 0.61–1.24)
Glucose, Bld: 113 mg/dL — ABNORMAL HIGH (ref 70–99)
HCT: 52 % (ref 39.0–52.0)
Hemoglobin: 17.7 g/dL — ABNORMAL HIGH (ref 13.0–17.0)
Potassium: 4.3 mmol/L (ref 3.5–5.1)
Sodium: 135 mmol/L (ref 135–145)
TCO2: 19 mmol/L — ABNORMAL LOW (ref 22–32)

## 2023-07-29 LAB — BASIC METABOLIC PANEL
Anion gap: 12 (ref 5–15)
BUN: 16 mg/dL (ref 8–23)
CO2: 19 mmol/L — ABNORMAL LOW (ref 22–32)
Calcium: 8.8 mg/dL — ABNORMAL LOW (ref 8.9–10.3)
Chloride: 103 mmol/L (ref 98–111)
Creatinine, Ser: 1.03 mg/dL (ref 0.61–1.24)
GFR, Estimated: >60 mL/min (ref >60)
Glucose, Bld: 120 mg/dL — ABNORMAL HIGH (ref 70–99)
Potassium: 4.1 mmol/L (ref 3.5–5.1)
Sodium: 134 mmol/L — ABNORMAL LOW (ref 135–145)

## 2023-07-29 LAB — CBC
HCT: 51.4 % (ref 39.0–52.0)
Hemoglobin: 17.8 g/dL — ABNORMAL HIGH (ref 13.0–17.0)
MCH: 35.1 pg — ABNORMAL HIGH (ref 26.0–34.0)
MCHC: 34.6 g/dL (ref 30.0–36.0)
MCV: 101.4 fL — ABNORMAL HIGH (ref 80.0–100.0)
Platelets: 241 10*3/uL (ref 150–400)
RBC: 5.07 MIL/uL (ref 4.22–5.81)
RDW: 12.3 % (ref 11.5–15.5)
WBC: 12.9 10*3/uL — ABNORMAL HIGH (ref 4.0–10.5)
nRBC: 0 % (ref 0.0–0.2)

## 2023-07-29 LAB — CBG MONITORING, ED: Glucose-Capillary: 108 mg/dL — ABNORMAL HIGH (ref 70–99)

## 2023-07-29 LAB — TSH: TSH: 0.663 u[IU]/mL (ref 0.350–4.500)

## 2023-07-29 LAB — BRAIN NATRIURETIC PEPTIDE: B Natriuretic Peptide: 61.2 pg/mL (ref 0.0–100.0)

## 2023-07-29 LAB — MAGNESIUM: Magnesium: 1.8 mg/dL (ref 1.7–2.4)

## 2023-07-29 LAB — TROPONIN I (HIGH SENSITIVITY)
Troponin I (High Sensitivity): 4 ng/L (ref <18)
Troponin I (High Sensitivity): 3 ng/L (ref <18)

## 2023-07-29 MED ORDER — ACETAMINOPHEN 650 MG RE SUPP: 650.0000 mg | Freq: Four times a day (QID) | RECTAL | Status: DC | PRN

## 2023-07-29 MED ORDER — BUPROPION HCL ER (XL) 150 MG PO TB24
150.0000 mg | ORAL_TABLET | Freq: Every day | ORAL | Status: DC
  Administered 2023-07-29 – 2023-07-30 (×2): 150 mg via ORAL
  Filled 2023-07-29 (×2): qty 1

## 2023-07-29 MED ORDER — AMIODARONE HCL IN DEXTROSE 360-4.14 MG/200ML-% IV SOLN
60.0000 mg/h | INTRAVENOUS | Status: AC
  Administered 2023-07-29: 60 mg/h via INTRAVENOUS
  Filled 2023-07-29: qty 200

## 2023-07-29 MED ORDER — IOHEXOL 350 MG/ML SOLN
75.0000 mL | Freq: Once | INTRAVENOUS | Status: AC | PRN
  Administered 2023-07-29: 75 mL via INTRAVENOUS

## 2023-07-29 MED ORDER — FENTANYL CITRATE PF 50 MCG/ML IJ SOSY
50.0000 ug | PREFILLED_SYRINGE | Freq: Once | INTRAMUSCULAR | Status: AC
  Administered 2023-07-29: 50 ug via INTRAVENOUS
  Filled 2023-07-29: qty 1

## 2023-07-29 MED ORDER — AMIODARONE LOAD VIA INFUSION
150.0000 mg | Freq: Once | INTRAVENOUS | Status: AC
  Administered 2023-07-29: 150 mg via INTRAVENOUS
  Filled 2023-07-29: qty 83.34

## 2023-07-29 MED ORDER — IRBESARTAN 300 MG PO TABS
300.0000 mg | ORAL_TABLET | Freq: Every day | ORAL | Status: DC
  Administered 2023-07-30: 300 mg via ORAL
  Filled 2023-07-29: qty 1

## 2023-07-29 MED ORDER — ACETAMINOPHEN 325 MG PO TABS: 650.0000 mg | ORAL_TABLET | Freq: Four times a day (QID) | ORAL | Status: DC | PRN

## 2023-07-29 MED ORDER — ONDANSETRON HCL 4 MG PO TABS: 4.0000 mg | ORAL_TABLET | Freq: Four times a day (QID) | ORAL | Status: DC | PRN

## 2023-07-29 MED ORDER — AMLODIPINE BESYLATE 5 MG PO TABS
10.0000 mg | ORAL_TABLET | Freq: Every day | ORAL | Status: DC
Start: 2023-07-29 — End: 2023-07-30
  Administered 2023-07-29 – 2023-07-30 (×2): 10 mg via ORAL
  Filled 2023-07-29 (×2): qty 2

## 2023-07-29 MED ORDER — APIXABAN 5 MG PO TABS
5.0000 mg | ORAL_TABLET | Freq: Two times a day (BID) | ORAL | Status: DC
  Administered 2023-07-29 – 2023-07-31 (×4): 5 mg via ORAL
  Filled 2023-07-29 (×4): qty 1

## 2023-07-29 MED ORDER — DILTIAZEM HCL 25 MG/5ML IV SOLN
10.0000 mg | Freq: Once | INTRAVENOUS | Status: AC
  Administered 2023-07-29: 10 mg via INTRAVENOUS
  Filled 2023-07-29: qty 5

## 2023-07-29 MED ORDER — AMIODARONE HCL IN DEXTROSE 360-4.14 MG/200ML-% IV SOLN
30.0000 mg/h | INTRAVENOUS | Status: DC
  Administered 2023-07-29 – 2023-07-30 (×3): 30 mg/h via INTRAVENOUS
  Filled 2023-07-29 (×6): qty 200

## 2023-07-29 MED ORDER — ONDANSETRON HCL 4 MG/2ML IJ SOLN: 4.0000 mg | Freq: Four times a day (QID) | INTRAMUSCULAR | Status: DC | PRN

## 2023-07-29 MED ORDER — BUPROPION HCL ER (XL) 150 MG PO TB24: 150.0000 mg | ORAL_TABLET | Freq: Every day | ORAL | Status: DC

## 2023-07-29 MED ORDER — AMLODIPINE BESYLATE 5 MG PO TABS
10.0000 mg | ORAL_TABLET | Freq: Every day | ORAL | Status: DC
  Filled 2023-07-29: qty 2

## 2023-07-29 MED ORDER — ALBUTEROL SULFATE (2.5 MG/3ML) 0.083% IN NEBU: 2.5000 mg | INHALATION_SOLUTION | RESPIRATORY_TRACT | Status: DC | PRN

## 2023-07-29 MED ORDER — TRAZODONE HCL 50 MG PO TABS: 50.0000 mg | ORAL_TABLET | Freq: Every evening | ORAL | Status: DC | PRN

## 2023-07-29 NOTE — ED Triage Notes (Signed)
 Pt presents with c/o chest pain that started this morning. Pt reports that the pain is tight in nature and in the center of his chest. Pt reports that his chest hurts when he inhales.

## 2023-07-29 NOTE — H&P (Signed)
 History and Physical  SALADIN PETRELLI VHQ:[id number] DOB: [birth year] DOA: 07/29/2023  PCP: Shelva Majestic, MD   Chief Complaint: Chest tightness  HPI: [name] [name] is a 63 y.o. male with medical history significant for hypertension, depression, psoriasis being admitted to the hospital with new onset symptomatic atrial fibrillation with RVR.  He has been in his usual state of health with no illness, no fevers, nausea vomiting.  He was recently started on Cosentyx injections for his psoriasis, and it is working very well for him.  This morning since he woke up, he has been feeling some vague nausea, and tightness in his chest.  Seems to worsen when he ambulates, and get a little bit better at rest.  Denies any palpitations, dizziness or other complaints.  He came to the ER for evaluation, was found to be in rapid A-fib with heart rate in the 140s.  He was given a dose of IV diltiazem, his heart rate improved to about 100, he is still in A-fib.  Other workup including troponins and CT angio chest are unremarkable.  However he does still feel the tightness in his chest.  Review of Systems: Please see HPI for pertinent positives and negatives. A complete 10 system review of systems are otherwise negative.  Past Medical History:  Diagnosis Date   Allergy    Boil 12/28/2021   Offered to lance, he declined Will use doxy for impetigo and he will use warm compresses.   Diverticulitis    leading to approx 1 foot resection-colectomy   History of kidney stones    Hx of adenomatous colonic polyps 10/19/2017   Hypertension    Impetigo any site 12/28/2021   Intertrigo 12/28/2021   NEPHROLITHIASIS, HX OF 03/09/2007   PONV (postoperative nausea and vomiting)    SPRAIN/STRAIN, ANKLE NOS 01/05/2007   Sweaty armpits 12/28/2021   Past Surgical History:  Procedure Laterality Date   COLECTOMY  09/2008   Dr Johna Sheriff   COLONOSCOPY     HERNIA REPAIR  09/18/12   lap incisional hernia repair, related to colectomy    VENTRAL HERNIA REPAIR N/A 09/18/2012   Social History:  reports that he quit smoking about 7 years ago. His smoking use included cigarettes and e-cigarettes. He has never used smokeless tobacco. He reports current alcohol use. He reports that he does not currently use drugs.  Allergies  Allergen Reactions   Benadryl [Diphenhydramine Hcl] Itching    Patient stated allergy after ordered by MD   Morphine And Codeine Itching    Family History  Problem Relation Age of Onset   CVA Mother        34   Heart attack Father        44   Sleep apnea Father        noncompliant with CPAP   Pulmonary Hypertension Father        related to cpap noncompliance. died 55 sepsis    Colon cancer Neg Hx    Colon polyps Neg Hx    Esophageal cancer Neg Hx    Rectal cancer Neg Hx    Stomach cancer Neg Hx      Prior to Admission medications   Medication Sig Start Date End Date Taking? Authorizing Provider  acetaminophen (TYLENOL) 500 MG tablet Take 500 mg by mouth every 6 (six) hours as needed for pain.    [provider]  aluminum chloride (DRYSOL) 20 % external solution Apply topically at bedtime. 12/28/21   Lula Olszewski,  MD  amLODipine (NORVASC) 10 MG tablet Take 1 tablet (10 mg total) by mouth daily. 06/11/23   Shelva Majestic, MD  Apremilast (OTEZLA) 4 x 10 & 51 x20 MG TBPK Take 1 tablet by mouth as directed. 03/24/23   Lula Olszewski, MD  buPROPion (WELLBUTRIN XL) 150 MG 24 hr tablet TAKE 1 TABLET(150 MG) BY MOUTH DAILY 06/11/23   Shelva Majestic, MD  calcipotriene (DOVONOX) 0.005 % ointment Apply topically 2 (two) times daily. 03/24/23   Lula Olszewski, MD  clobetasol (TEMOVATE) 0.05 % external solution Apply 1 Application topically 2 (two) times daily. 03/24/23   Lula Olszewski, MD  clobetasol cream (TEMOVATE) 0.05 % Apply 1 Application topically 2 (two) times daily. Do not use on genitals or face 03/24/23   Lula Olszewski, MD  doxycycline (VIBRA-TABS) 100 MG tablet Take 1 tablet  (100 mg total) by mouth 2 (two) times daily. Patient not taking: Reported on 03/24/2023 01/11/22   Dulce Sellar, NP  hydrocortisone 1 % ointment Apply 1 Application topically 2 (two) times daily. For rash on face 14 day max 03/24/23   Lula Olszewski, MD  ibuprofen (ADVIL,MOTRIN) 200 MG tablet Take 200 mg by mouth every 6 (six) hours as needed.    [provider]  nystatin (MYCOSTATIN/NYSTOP) powder Apply 1 Application topically 3 (three) times daily. 12/28/21   Lula Olszewski, MD  olmesartan (BENICAR) 40 MG tablet TAKE 1 TABLET(40 MG) BY MOUTH DAILY 02/25/23   Shelva Majestic, MD  predniSONE (DELTASONE) 20 MG tablet Take 2 pills for 3 days, 1 pill for 4 days 03/24/23   Lula Olszewski, MD  Secukinumab (COSENTYX UNOREADY) 300 MG/2ML SOAJ Inject 300 mg into the skin once a week. Weeks 0,1,2,3 05/28/23   Gwenith Daily, MD  Secukinumab (COSENTYX UNOREADY) 300 MG/2ML SOAJ Inject 300 mg into the skin every 30 (thirty) days. Starting at week 4, inject one pen monthly 05/28/23   Gwenith Daily, MD  triamcinolone cream (KENALOG) 0.1 % Apply 1 application. topically 2 (two) times daily. For 7-10 days maximum Patient not taking: Reported on 03/24/2023 09/04/21   Shelva Majestic, MD  triamcinolone ointment (KENALOG) 0.1 % Apply 1 Application topically 2 (two) times daily. 05/28/23   Gwenith Daily, MD    Physical Exam: BP 137/88   Pulse (!) 101   Temp 98.9 F (37.2 C) (Oral)   Resp (!) 22   SpO2 96%  General:  Alert, oriented, calm, looks a little bit uncomfortable, but not in acute distress. Eyes: EOMI, clear conjuctivae, white sclerea Neck: supple, no masses, trachea mildline  Cardiovascular: Irregularly irregular, no murmurs or rubs, no peripheral edema  Respiratory: clear to auscultation bilaterally, no wheezes, no crackles  Abdomen: soft, nontender, nondistended, normal bowel tones heard  Skin: dry, no rashes  Musculoskeletal: no joint effusions, normal range of motion   Psychiatric: appropriate affect, normal speech  Neurologic: extraocular muscles intact, clear speech, moving all extremities with intact sensorium         Labs on Admission:  Basic Metabolic Panel: Recent Labs  Lab 07/29/23 1210 07/29/23 1242  NA 134* 135  K 4.1 4.3  CL 103 105  CO2 19*  --   GLUCOSE 120* 113*  BUN 16 16  CREATININE 1.03 0.90  CALCIUM 8.8*  --    Liver Function Tests: No results for input(s): "AST", "ALT", "ALKPHOS", "BILITOT", "PROT", "ALBUMIN" in the last 168 hours. No results for input(s): "LIPASE", "AMYLASE" in  the last 168 hours. No results for input(s): "AMMONIA" in the last 168 hours. CBC: Recent Labs  Lab 07/29/23 1210 07/29/23 1242  WBC 12.9*  --   HGB 17.8* 17.7*  HCT 51.4 52.0  MCV 101.4*  --   PLT 241  --    Cardiac Enzymes: No results for input(s): "CKTOTAL", "CKMB", "CKMBINDEX", "TROPONINI" in the last 168 hours. BNP (last 3 results) Recent Labs    07/29/23 1420  BNP 61.2    ProBNP (last 3 results) No results for input(s): "PROBNP" in the last 8760 hours.  CBG: Recent Labs  Lab 07/29/23 1246  GLUCAP 108*    Radiological Exams on Admission: CT Angio Chest Aorta W and/or Wo Contrast Result Date: 07/29/2023 CLINICAL DATA:  Acute aortic syndrome (AAS) suspected Central chest pain since this morning. EXAM: CT ANGIOGRAPHY CHEST WITH CONTRAST TECHNIQUE: Multidetector CT imaging of the chest was performed using the standard protocol during bolus administration of intravenous contrast. Pre contrast imaging was performed. Multiplanar CT image reconstructions and MIPs were obtained to evaluate the vascular anatomy. RADIATION DOSE REDUCTION: This exam was performed according to the departmental dose-optimization program which includes automated exposure control, adjustment of the mA and/or kV according to patient size and/or use of iterative reconstruction technique. CONTRAST:  75mL OMNIPAQUE IOHEXOL 350 MG/ML SOLN COMPARISON:  Chest  radiographs 07/29/2023 and 09/11/2012. Abdominal CT 08/03/2020. FINDINGS: Cardiovascular: Pre contrast images demonstrate age advanced coronary artery atherosclerosis. Relatively mild aortic and great vessel atherosclerosis. No evidence of aortic intramural hematoma, aneurysm or dissection. No evidence for acute pulmonary embolism. The right brachiocephalic and right common carotid arteries share a common origin from the aortic arch. Vestigial left vertebral artery arises directly from the aortic arch. The heart is mildly enlarged. There is a small amount of pericardial fluid. Mediastinum/Nodes: There are no enlarged mediastinal, hilar or axillary lymph nodes. The thyroid gland, trachea and esophagus demonstrate no significant findings. Lungs/Pleura: No pleural effusion or pneumothorax. Mild dependent atelectasis at both lung bases. No confluent airspace disease or suspicious pulmonary nodularity. Upper abdomen: No acute findings are seen in the visualized upper abdomen. Musculoskeletal/Chest wall: There is no chest wall mass or suspicious osseous finding. Mild spondylosis with prominent paraspinal osteophytes. Review of the MIP images confirms the above findings. IMPRESSION: 1. No evidence of acute aortic syndrome or other acute chest findings. 2. Age advanced coronary artery atherosclerosis. 3. Mild cardiomegaly and small pericardial effusion. 4.  Aortic Atherosclerosis (ICD10-I70.0). Electronically Signed   By: Carey Bullocks M.D.   On: 07/29/2023 16:49   DG Chest Portable 1 View Result Date: 07/29/2023 CLINICAL DATA:  Chest pain. EXAM: PORTABLE CHEST 1 VIEW COMPARISON:  Sep 11, 2012. FINDINGS: Mild cardiomegaly is noted. Both lungs are clear. The visualized skeletal structures are unremarkable. IMPRESSION: No active disease. Electronically Signed   By: Lupita Raider M.D.   On: 07/29/2023 14:59   Assessment/Plan DUANNE DUCHESNE is a 63 y.o. male with medical history significant for hypertension,  depression, psoriasis being admitted to the hospital with new onset symptomatic atrial fibrillation with RVR.   Atrial fibrillation with RVR-this is a new finding for the patient, he is hemodynamically stable and heart rate is improved with p.o. diltiazem.  However he feels some chest tightness and vague nausea, I think he is not tolerating the atrial fibrillation well even though his heart rate is improved.  Note that his new psoriasis injection Cosentyx is known to have a risk of atrial fibrillation. -Observation admission to progressive bed -  Continuous telemetry monitoring -Will start IV amiodarone bolus and drip, hoping he may convert to sinus rhythm -Start Eliquis anticoagulation -Check TSH, magnesium level, and 2D echo  Hypertension-continue home olmesartan and amlodipine  Depression-continue Wellbutrin  DVT prophylaxis: Eliquis    Code Status: Full Code  Consults called: None  Admission status: Observation  Time spent: 48 minutes  Madilyne Tadlock Sharlette Dense MD [facility] Pager [phone]  If 7PM-7AM, please contact night-coverage [email or web address] Password [facility]  07/29/2023, 5:27 PM

## 2023-07-29 NOTE — Discharge Instructions (Signed)

## 2023-07-29 NOTE — ED Notes (Signed)
 ED TO INPATIENT HANDOFF REPORT  ED Nurse Name and Phone #: Majel Homer, RN   S Name/Age/Gender [name] [name] 63 y.o. male Room/Bed: WA23/WA23  Code Status   Code Status: Full Code  Home/SNF/Other Home Patient oriented to: self, place, time, and situation Is this baseline? Yes   Triage Complete: Triage complete  Chief Complaint Atrial fibrillation with rapid ventricular response (HCC) [I48.91]  Triage Note Pt presents with c/o chest pain that started this morning. Pt reports that the pain is tight in nature and in the center of his chest. Pt reports that his chest hurts when he inhales.   Allergies Allergies  Allergen Reactions   Benadryl [Diphenhydramine Hcl] Itching and Other (See Comments)    Patient stated allergy after ordered by MD   Morphine And Codeine Itching    Level of Care/Admitting Diagnosis ED Disposition     ED Disposition  Admit   Condition  --   Comment  Hospital Area: [facility] [[id number]]  Level of Care: Progressive [102]  Admit to Progressive based on following criteria: CARDIOVASCULAR & THORACIC of moderate stability with acute coronary syndrome symptoms/low risk myocardial infarction/hypertensive urgency/arrhythmias/heart failure potentially compromising stability and stable post cardiovascular intervention patients.  May place patient in observation at [facility] or [facility] if equivalent level of care is available:: Yes  Covid Evaluation: Asymptomatic - no recent exposure (last 10 days) testing not required  Diagnosis: Atrial fibrillation with rapid ventricular response [facility]) [[id number]]  Admitting Physician: Maryln Gottron [[id number]]  Attending Physician: Maryln Gottron [[id number]]          B Medical/Surgery History Past Medical History:  Diagnosis Date   Allergy    Boil 12/28/2021   Offered to lance, he declined Will use doxy for impetigo and he will use warm compresses.   Diverticulitis    leading to  approx 1 foot resection-colectomy   History of kidney stones    Hx of adenomatous colonic polyps 10/19/2017   Hypertension    Impetigo any site 12/28/2021   Intertrigo 12/28/2021   NEPHROLITHIASIS, HX OF 03/09/2007   PONV (postoperative nausea and vomiting)    SPRAIN/STRAIN, ANKLE NOS 01/05/2007   Sweaty armpits 12/28/2021   Past Surgical History:  Procedure Laterality Date   COLECTOMY  09/2008   Dr Johna Sheriff   COLONOSCOPY     HERNIA REPAIR  09/18/12   lap incisional hernia repair, related to colectomy   VENTRAL HERNIA REPAIR N/A 09/18/2012     A IV Location/Drains/Wounds Patient Lines/Drains/Airways Status     Active Line/Drains/Airways     Name Placement date Placement time Site Days   Peripheral IV 07/29/23 20 G Left Antecubital 07/29/23  1432  Antecubital  less than 1   Peripheral IV 07/29/23 20 G Right Antecubital 07/29/23  1905  Antecubital  less than 1   Incision 09/18/12 Abdomen Other (Comment) 09/18/12  1234  -- 3966   Incision - 2 Ports Abdomen 1: Left;Lateral 2: Left;Lateral;Upper 09/18/12  --  -- 3966            Intake/Output Last 24 hours No intake or output data in the 24 hours ending 07/29/23 1942  Labs/Imaging Results for orders placed or performed during the hospital encounter of 07/29/23 (from the past 48 hours)  Basic metabolic panel     Status: Abnormal   Collection Time: 07/29/23 12:10 PM  Result Value Ref Range   Sodium 134 (L) 135 - 145 mmol/L   Potassium 4.1 3.5 -  5.1 mmol/L   Chloride 103 98 - 111 mmol/L   CO2 19 (L) 22 - 32 mmol/L   Glucose, Bld 120 (H) 70 - 99 mg/dL    Comment: Glucose reference range applies only to samples taken after fasting for at least 8 hours.   BUN 16 8 - 23 mg/dL   Creatinine, Ser 1.61 0.61 - 1.24 mg/dL   Calcium 8.8 (L) 8.9 - 10.3 mg/dL   GFR, Estimated >09 >60 mL/min    Comment: (NOTE) Calculated using the CKD-EPI Creatinine Equation (2021)    Anion gap 12 5 - 15    Comment: Performed at [facility], [address] [place], [place] [address]  CBC     Status: Abnormal   Collection Time: 07/29/23 12:10 PM  Result Value Ref Range   WBC 12.9 (H) 4.0 - 10.5 K/uL   RBC 5.07 4.22 - 5.81 MIL/uL   Hemoglobin 17.8 (H) 13.0 - 17.0 g/dL   HCT 81.1 91.4 - 78.2 %   MCV 101.4 (H) 80.0 - 100.0 fL   MCH 35.1 (H) 26.0 - 34.0 pg   MCHC 34.6 30.0 - 36.0 g/dL   RDW 95.6 21.3 - 08.6 %   Platelets 241 150 - 400 K/uL   nRBC 0.0 0.0 - 0.2 %    Comment: Performed at [facility], [address] [place], [place] [address]  Troponin I (High Sensitivity)     Status: None   Collection Time: 07/29/23 12:10 PM  Result Value Ref Range   Troponin I (High Sensitivity) 4 <18 ng/L    Comment: (NOTE) Elevated high sensitivity troponin I (hsTnI) values and significant  changes across serial measurements may suggest ACS but many other  chronic and acute conditions are known to elevate hsTnI results.  Refer to the "Links" section for chest pain algorithms and additional  guidance. Performed at [facility], [address] [place], [place] [address]   I-stat chem 8, ED (not at [facility], [facility] or [facility])     Status: Abnormal   Collection Time: 07/29/23 12:42 PM  Result Value Ref Range   Sodium 135 135 - 145 mmol/L   Potassium 4.3 3.5 - 5.1 mmol/L   Chloride 105 98 - 111 mmol/L   BUN 16 8 - 23 mg/dL   Creatinine, Ser 2.84 0.61 - 1.24 mg/dL   Glucose, Bld 132 (H) 70 - 99 mg/dL    Comment: Glucose reference range applies only to samples taken after fasting for at least 8 hours.   Calcium, Ion 1.12 (L) 1.15 - 1.40 mmol/L   TCO2 19 (L) 22 - 32 mmol/L   Hemoglobin 17.7 (H) 13.0 - 17.0 g/dL   HCT 44.0 10.2 - 72.5 %  CBG monitoring, ED     Status: Abnormal   Collection Time: 07/29/23 12:46 PM  Result Value Ref Range   Glucose-Capillary 108 (H) 70 - 99 mg/dL    Comment: Glucose reference range applies only to samples taken after fasting for at least 8 hours.  Brain  natriuretic peptide     Status: None   Collection Time: 07/29/23  2:20 PM  Result Value Ref Range   B Natriuretic Peptide 61.2 0.0 - 100.0 pg/mL    Comment: Performed at [facility], [address], [place], [place] [address]  Troponin I (High Sensitivity)     Status: None   Collection Time: 07/29/23  2:20 PM  Result Value Ref Range   Troponin  I (High Sensitivity) 3 <18 ng/L    Comment: (NOTE) Elevated high sensitivity troponin I (hsTnI) values and significant  changes across serial measurements may suggest ACS but many other  chronic and acute conditions are known to elevate hsTnI results.  Refer to the "Links" section for chest pain algorithms and additional  guidance. Performed at [facility], [address] [place], [place] [address]    CT Angio Chest Aorta W and/or Wo Contrast Result Date: 07/29/2023 CLINICAL DATA:  Acute aortic syndrome (AAS) suspected Central chest pain since this morning. EXAM: CT ANGIOGRAPHY CHEST WITH CONTRAST TECHNIQUE: Multidetector CT imaging of the chest was performed using the standard protocol during bolus administration of intravenous contrast. Pre contrast imaging was performed. Multiplanar CT image reconstructions and MIPs were obtained to evaluate the vascular anatomy. RADIATION DOSE REDUCTION: This exam was performed according to the departmental dose-optimization program which includes automated exposure control, adjustment of the mA and/or kV according to patient size and/or use of iterative reconstruction technique. CONTRAST:  75mL OMNIPAQUE IOHEXOL 350 MG/ML SOLN COMPARISON:  Chest radiographs 07/29/2023 and 09/11/2012. Abdominal CT 08/03/2020. FINDINGS: Cardiovascular: Pre contrast images demonstrate age advanced coronary artery atherosclerosis. Relatively mild aortic and great vessel atherosclerosis. No evidence of aortic intramural hematoma, aneurysm or dissection. No evidence for acute pulmonary embolism. The  right brachiocephalic and right common carotid arteries share a common origin from the aortic arch. Vestigial left vertebral artery arises directly from the aortic arch. The heart is mildly enlarged. There is a small amount of pericardial fluid. Mediastinum/Nodes: There are no enlarged mediastinal, hilar or axillary lymph nodes. The thyroid gland, trachea and esophagus demonstrate no significant findings. Lungs/Pleura: No pleural effusion or pneumothorax. Mild dependent atelectasis at both lung bases. No confluent airspace disease or suspicious pulmonary nodularity. Upper abdomen: No acute findings are seen in the visualized upper abdomen. Musculoskeletal/Chest wall: There is no chest wall mass or suspicious osseous finding. Mild spondylosis with prominent paraspinal osteophytes. Review of the MIP images confirms the above findings. IMPRESSION: 1. No evidence of acute aortic syndrome or other acute chest findings. 2. Age advanced coronary artery atherosclerosis. 3. Mild cardiomegaly and small pericardial effusion. 4.  Aortic Atherosclerosis (ICD10-I70.0). Electronically Signed   By: Carey Bullocks M.D.   On: 07/29/2023 16:49   DG Chest Portable 1 View Result Date: 07/29/2023 CLINICAL DATA:  Chest pain. EXAM: PORTABLE CHEST 1 VIEW COMPARISON:  Sep 11, 2012. FINDINGS: Mild cardiomegaly is noted. Both lungs are clear. The visualized skeletal structures are unremarkable. IMPRESSION: No active disease. Electronically Signed   By: Lupita Raider M.D.   On: 07/29/2023 14:59    Pending Labs Unresulted Labs (From admission, onward)     Start     Ordered   07/29/23 1800  Magnesium  Once,   AD        07/29/23 1800   07/29/23 1725  TSH  Add-on,   AD        07/29/23 1727   07/29/23 1724  HIV Antibody (routine testing w rflx)  (HIV Antibody (Routine testing w reflex) panel)  Once,   R        07/29/23 1727            Vitals/Pain Today's Vitals   07/29/23 1715 07/29/23 1730 07/29/23 1800 07/29/23 1900   BP: (!) 140/91 124/83 (!) 146/86   Pulse: (!) 112 97 (!) 111 100  Resp: (!) 25  15   Temp:   98 F (36.7 C)  TempSrc:   Oral   SpO2: 95% 94% 93% 91%  PainSc:        Isolation Precautions No active isolations  Medications Medications  acetaminophen (TYLENOL) tablet 650 mg (has no administration in time range)    Or  acetaminophen (TYLENOL) suppository 650 mg (has no administration in time range)  traZODone (DESYREL) tablet 50 mg (has no administration in time range)  ondansetron (ZOFRAN) tablet 4 mg (has no administration in time range)    Or  ondansetron (ZOFRAN) injection 4 mg (has no administration in time range)  albuterol (PROVENTIL) (2.5 MG/3ML) 0.083% nebulizer solution 2.5 mg (has no administration in time range)  amiodarone (NEXTERONE) 1.8 mg/mL load via infusion 150 mg (150 mg Intravenous Bolus from Bag 07/29/23 1840)    Followed by  amiodarone (NEXTERONE PREMIX) 360-4.14 MG/200ML-% (1.8 mg/mL) IV infusion (60 mg/hr Intravenous New Bag/Given 07/29/23 1842)    Followed by  amiodarone (NEXTERONE PREMIX) 360-4.14 MG/200ML-% (1.8 mg/mL) IV infusion (has no administration in time range)  amLODipine (NORVASC) tablet 10 mg (10 mg Oral Given 07/29/23 1807)  irbesartan (AVAPRO) tablet 300 mg (has no administration in time range)  apixaban (ELIQUIS) tablet 5 mg (5 mg Oral Given 07/29/23 1807)  buPROPion (WELLBUTRIN XL) 24 hr tablet 150 mg (150 mg Oral Not Given 07/29/23 1940)  fentaNYL (SUBLIMAZE) injection 50 mcg (50 mcg Intravenous Given 07/29/23 1228)  diltiazem (CARDIZEM) injection 10 mg (10 mg Intravenous Given 07/29/23 1228)  iohexol (OMNIPAQUE) 350 MG/ML injection 75 mL (75 mLs Intravenous Contrast Given 07/29/23 1438)    Mobility walks     Focused Assessments See Chart   R Recommendations: See Admitting Provider Note  Report given to:   Additional Notes: See Chart

## 2023-07-29 NOTE — ED Provider Notes (Addendum)
 [place] EMERGENCY DEPARTMENT AT [facility] Provider Note   CSN: [id number] Arrival date & time: 07/29/23  1203     History  Chief Complaint  Patient presents with   Chest Pain    [name] [name] is a 63 y.o. male.   Chest Pain    Patient has a history of hypertension diverticulitis morbid obesity depression impetigo, intertrigo.  Pt presents To the ED with symptoms of sharp chest pain.  Patient states that the symptoms started this morning.  It occurred while he was watching TV.  Its sharp pain and it hurts for him to breathe.  He denies any leg swelling.  No fevers or chills.  He does not have any history of heart or lung disease that he is aware of.  Home Medications Prior to Admission medications   Medication Sig Start Date End Date Taking? Authorizing Provider  acetaminophen (TYLENOL) 500 MG tablet Take 500 mg by mouth every 6 (six) hours as needed for pain.    [provider]  aluminum chloride (DRYSOL) 20 % external solution Apply topically at bedtime. 12/28/21   Lula Olszewski, MD  amLODipine (NORVASC) 10 MG tablet Take 1 tablet (10 mg total) by mouth daily. 06/11/23   Shelva Majestic, MD  Apremilast (OTEZLA) 4 x 10 & 51 x20 MG TBPK Take 1 tablet by mouth as directed. 03/24/23   Lula Olszewski, MD  buPROPion (WELLBUTRIN XL) 150 MG 24 hr tablet TAKE 1 TABLET(150 MG) BY MOUTH DAILY 06/11/23   Shelva Majestic, MD  calcipotriene (DOVONOX) 0.005 % ointment Apply topically 2 (two) times daily. 03/24/23   Lula Olszewski, MD  clobetasol (TEMOVATE) 0.05 % external solution Apply 1 Application topically 2 (two) times daily. 03/24/23   Lula Olszewski, MD  clobetasol cream (TEMOVATE) 0.05 % Apply 1 Application topically 2 (two) times daily. Do not use on genitals or face 03/24/23   Lula Olszewski, MD  doxycycline (VIBRA-TABS) 100 MG tablet Take 1 tablet (100 mg total) by mouth 2 (two) times daily. Patient not taking: Reported on 03/24/2023 01/11/22    Dulce Sellar, NP  hydrocortisone 1 % ointment Apply 1 Application topically 2 (two) times daily. For rash on face 14 day max 03/24/23   Lula Olszewski, MD  ibuprofen (ADVIL,MOTRIN) 200 MG tablet Take 200 mg by mouth every 6 (six) hours as needed.    [provider]  nystatin (MYCOSTATIN/NYSTOP) powder Apply 1 Application topically 3 (three) times daily. 12/28/21   Lula Olszewski, MD  olmesartan (BENICAR) 40 MG tablet TAKE 1 TABLET(40 MG) BY MOUTH DAILY 02/25/23   Shelva Majestic, MD  predniSONE (DELTASONE) 20 MG tablet Take 2 pills for 3 days, 1 pill for 4 days 03/24/23   Lula Olszewski, MD  Secukinumab (COSENTYX UNOREADY) 300 MG/2ML SOAJ Inject 300 mg into the skin once a week. Weeks 0,1,2,3 05/28/23   Gwenith Daily, MD  Secukinumab (COSENTYX UNOREADY) 300 MG/2ML SOAJ Inject 300 mg into the skin every 30 (thirty) days. Starting at week 4, inject one pen monthly 05/28/23   Gwenith Daily, MD  triamcinolone cream (KENALOG) 0.1 % Apply 1 application. topically 2 (two) times daily. For 7-10 days maximum Patient not taking: Reported on 03/24/2023 09/04/21   Shelva Majestic, MD  triamcinolone ointment (KENALOG) 0.1 % Apply 1 Application topically 2 (two) times daily. 05/28/23   Gwenith Daily, MD      Allergies    Benadryl [diphenhydramine  hcl] and Morphine and codeine    Review of Systems   Review of Systems  Cardiovascular:  Positive for chest pain.    Physical Exam Updated Vital Signs BP 137/88   Pulse (!) 101   Temp 98.9 F (37.2 C) (Oral)   Resp (!) 22   SpO2 96%  Physical Exam Vitals and nursing note reviewed.  Constitutional:      General: He is not in acute distress.    Appearance: He is well-developed. He is not diaphoretic.  HENT:     Head: Normocephalic and atraumatic.     Right Ear: External ear normal.     Left Ear: External ear normal.  Eyes:     General: No scleral icterus.       Right eye: No discharge.        Left eye: No discharge.      Conjunctiva/sclera: Conjunctivae normal.  Neck:     Trachea: No tracheal deviation.  Cardiovascular:     Rate and Rhythm: Tachycardia present. Rhythm irregularly irregular.  Pulmonary:     Effort: Pulmonary effort is normal. No respiratory distress.     Breath sounds: Normal breath sounds. No stridor. No wheezing or rales.  Abdominal:     General: Bowel sounds are normal. There is no distension.     Palpations: Abdomen is soft.     Tenderness: There is no abdominal tenderness. There is no guarding or rebound.  Musculoskeletal:        General: No tenderness or deformity.     Cervical back: Neck supple.  Skin:    General: Skin is warm and dry.     Findings: No rash.  Neurological:     General: No focal deficit present.     Mental Status: He is alert.     Cranial Nerves: No cranial nerve deficit, dysarthria or facial asymmetry.     Sensory: No sensory deficit.     Motor: No abnormal muscle tone or seizure activity.     Coordination: Coordination normal.  Psychiatric:        Mood and Affect: Mood normal.     ED Results / Procedures / Treatments   Labs (all labs ordered are listed, but only abnormal results are displayed) Labs Reviewed  BASIC METABOLIC PANEL - Abnormal; Notable for the following components:      Result Value   Sodium 134 (*)    CO2 19 (*)    Glucose, Bld 120 (*)    Calcium 8.8 (*)    All other components within normal limits  CBC - Abnormal; Notable for the following components:   WBC 12.9 (*)    Hemoglobin 17.8 (*)    MCV 101.4 (*)    MCH 35.1 (*)    All other components within normal limits  CBG MONITORING, ED - Abnormal; Notable for the following components:   Glucose-Capillary 108 (*)    All other components within normal limits  I-STAT CHEM 8, ED - Abnormal; Notable for the following components:   Glucose, Bld 113 (*)    Calcium, Ion 1.12 (*)    TCO2 19 (*)    Hemoglobin 17.7 (*)    All other components within normal limits  BRAIN NATRIURETIC  PEPTIDE  HIV ANTIBODY (ROUTINE TESTING W REFLEX)  TSH  TROPONIN I (HIGH SENSITIVITY)  TROPONIN I (HIGH SENSITIVITY)    EKG A fib rate 157 Low voltage qrs Repolarization changes No st elevation or depression  Radiology CT Angio Chest Aorta W  and/or Wo Contrast Result Date: 07/29/2023 CLINICAL DATA:  Acute aortic syndrome (AAS) suspected Central chest pain since this morning. EXAM: CT ANGIOGRAPHY CHEST WITH CONTRAST TECHNIQUE: Multidetector CT imaging of the chest was performed using the standard protocol during bolus administration of intravenous contrast. Pre contrast imaging was performed. Multiplanar CT image reconstructions and MIPs were obtained to evaluate the vascular anatomy. RADIATION DOSE REDUCTION: This exam was performed according to the departmental dose-optimization program which includes automated exposure control, adjustment of the mA and/or kV according to patient size and/or use of iterative reconstruction technique. CONTRAST:  75mL OMNIPAQUE IOHEXOL 350 MG/ML SOLN COMPARISON:  Chest radiographs 07/29/2023 and 09/11/2012. Abdominal CT 08/03/2020. FINDINGS: Cardiovascular: Pre contrast images demonstrate age advanced coronary artery atherosclerosis. Relatively mild aortic and great vessel atherosclerosis. No evidence of aortic intramural hematoma, aneurysm or dissection. No evidence for acute pulmonary embolism. The right brachiocephalic and right common carotid arteries share a common origin from the aortic arch. Vestigial left vertebral artery arises directly from the aortic arch. The heart is mildly enlarged. There is a small amount of pericardial fluid. Mediastinum/Nodes: There are no enlarged mediastinal, hilar or axillary lymph nodes. The thyroid gland, trachea and esophagus demonstrate no significant findings. Lungs/Pleura: No pleural effusion or pneumothorax. Mild dependent atelectasis at both lung bases. No confluent airspace disease or suspicious pulmonary nodularity. Upper  abdomen: No acute findings are seen in the visualized upper abdomen. Musculoskeletal/Chest wall: There is no chest wall mass or suspicious osseous finding. Mild spondylosis with prominent paraspinal osteophytes. Review of the MIP images confirms the above findings. IMPRESSION: 1. No evidence of acute aortic syndrome or other acute chest findings. 2. Age advanced coronary artery atherosclerosis. 3. Mild cardiomegaly and small pericardial effusion. 4.  Aortic Atherosclerosis (ICD10-I70.0). Electronically Signed   By: Carey Bullocks M.D.   On: 07/29/2023 16:49   DG Chest Portable 1 View Result Date: 07/29/2023 CLINICAL DATA:  Chest pain. EXAM: PORTABLE CHEST 1 VIEW COMPARISON:  Sep 11, 2012. FINDINGS: Mild cardiomegaly is noted. Both lungs are clear. The visualized skeletal structures are unremarkable. IMPRESSION: No active disease. Electronically Signed   By: Lupita Raider M.D.   On: 07/29/2023 14:59    Procedures Procedures    Medications Ordered in ED Medications  amLODipine (NORVASC) tablet 10 mg (has no administration in time range)  acetaminophen (TYLENOL) tablet 650 mg (has no administration in time range)    Or  acetaminophen (TYLENOL) suppository 650 mg (has no administration in time range)  traZODone (DESYREL) tablet 50 mg (has no administration in time range)  ondansetron (ZOFRAN) tablet 4 mg (has no administration in time range)    Or  ondansetron (ZOFRAN) injection 4 mg (has no administration in time range)  albuterol (PROVENTIL) (2.5 MG/3ML) 0.083% nebulizer solution 2.5 mg (has no administration in time range)  fentaNYL (SUBLIMAZE) injection 50 mcg (50 mcg Intravenous Given 07/29/23 1228)  diltiazem (CARDIZEM) injection 10 mg (10 mg Intravenous Given 07/29/23 1228)  iohexol (OMNIPAQUE) 350 MG/ML injection 75 mL (75 mLs Intravenous Contrast Given 07/29/23 1438)    ED Course/ Medical Decision Making/ A&P Clinical Course as of 07/29/23 1727  Tue Jul 29, 2023  1452 CBC shows  elevated white blood cell count.  Metabolic panel without significant abnormalities [JK]  1454 Troponin normal [JK]  1654 CT scan does not show any acute abnormality.  Mild cardiomegaly and small pericardial effusion noted [JK]  1702 Patient remains in A-fib on the monitor. [JK]    Clinical Course User Index [JK]  Linwood Dibbles, MD         CHA2DS2-VASc Score: 1                        Medical Decision Making Differential diagnosis includes but not limited to pneumonia pneumothorax acute coronary syndrome pulmonary embolism.  EKG consistent with new onset A-fib  Amount and/or Complexity of Data Reviewed Labs: ordered. Radiology: ordered.  Risk Prescription drug management. Decision regarding hospitalization.   Patient presented to the ED for evaluation of new onset chest pain.  Patient noted to be in new onset atrial fibrillation.  Patient's laboratory test do not show signs of acute cardiac ischemia.  BNP normal without signs of heart failure.  CT angiogram was performed and there does not appear to be any evidence of aortic dissection.  Patient remains in atrial fibrillation.  Will consult with medical service for admission for further evaluation.        Final Clinical Impression(s) / ED Diagnoses Final diagnoses:  Atrial fibrillation, unspecified type [facility])    Rx / DC Orders ED Discharge Orders     None         Linwood Dibbles, MD 07/29/23 1705    Linwood Dibbles, MD 07/29/23 1727    Linwood Dibbles, MD 07/29/23 1729

## 2023-07-29 NOTE — Progress Notes (Signed)
 PHARMACY - ANTICOAGULATION CONSULT NOTE  Pharmacy Consult for Eliquis Indication: atrial fibrillation  Allergies  Allergen Reactions   Benadryl [Diphenhydramine Hcl] Itching    Patient stated allergy after ordered by MD   Morphine And Codeine Itching    Patient Measurements:    Vital Signs: Temp: 98.9 F (37.2 C) (03/18 1621) Temp Source: Oral (03/18 1621) BP: 124/83 (03/18 1730) Pulse Rate: 97 (03/18 1730)  Labs: Recent Labs    07/29/23 1210 07/29/23 1242 07/29/23 1420  HGB 17.8* 17.7*  --   HCT 51.4 52.0  --   PLT 241  --   --   CREATININE 1.03 0.90  --   TROPONINIHS 4  --  3    CrCl cannot be calculated (Unknown ideal weight.).   Medical History: Past Medical History:  Diagnosis Date   Allergy    Boil 12/28/2021   Offered to lance, he declined Will use doxy for impetigo and he will use warm compresses.   Diverticulitis    leading to approx 1 foot resection-colectomy   History of kidney stones    Hx of adenomatous colonic polyps 10/19/2017   Hypertension    Impetigo any site 12/28/2021   Intertrigo 12/28/2021   NEPHROLITHIASIS, HX OF 03/09/2007   PONV (postoperative nausea and vomiting)    SPRAIN/STRAIN, ANKLE NOS 01/05/2007   Sweaty armpits 12/28/2021    Medications:  (Not in a hospital admission)  Scheduled:   amiodarone  150 mg Intravenous Once   amLODipine  10 mg Oral Daily   apixaban  5 mg Oral BID   [START ON 07/30/2023] irbesartan  300 mg Oral Daily   PRN: acetaminophen **OR** acetaminophen, albuterol, ondansetron **OR** ondansetron (ZOFRAN) IV, traZODone  Assessment: 1 yoM with PMH HTN, admitted 3/18 for new onset Afib. Pharmacy consulted to start Eliquis.  Today, 07/29/2023: Hgb slightly elevated on admission (likely hemoconcentrated); Plt WNL SCr < 1.0 (baseline); age < 42; no recent weight (but last documented at 129 kg in 2023) No bleeding or stroke symptoms documented   Plan:  Eliquis 5 mg PO bid starting tonight Pharmacy to provide  Eliquis counseling and coupons prior to discharge Pharmacy will sign off, following peripherally for dosing adjustments or clinical status changes  Melvine Julin A 07/29/2023,5:46 PM

## 2023-07-30 ENCOUNTER — Other Ambulatory Visit (HOSPITAL_COMMUNITY): Payer: Self-pay

## 2023-07-30 ENCOUNTER — Telehealth (HOSPITAL_COMMUNITY): Payer: Self-pay | Admitting: Pharmacy Technician

## 2023-07-30 ENCOUNTER — Observation Stay (HOSPITAL_COMMUNITY)

## 2023-07-30 DIAGNOSIS — R079 Chest pain, unspecified: Secondary | ICD-10-CM | POA: Diagnosis not present

## 2023-07-30 DIAGNOSIS — I3139 Other pericardial effusion (noninflammatory): Secondary | ICD-10-CM

## 2023-07-30 DIAGNOSIS — Z9049 Acquired absence of other specified parts of digestive tract: Secondary | ICD-10-CM | POA: Diagnosis not present

## 2023-07-30 DIAGNOSIS — I10 Essential (primary) hypertension: Secondary | ICD-10-CM

## 2023-07-30 DIAGNOSIS — Z87891 Personal history of nicotine dependence: Secondary | ICD-10-CM | POA: Diagnosis not present

## 2023-07-30 DIAGNOSIS — I4891 Unspecified atrial fibrillation: Secondary | ICD-10-CM | POA: Diagnosis not present

## 2023-07-30 LAB — ECHOCARDIOGRAM COMPLETE
AR max vel: 2.45 cm2
AV Area VTI: 2.64 cm2
AV Area mean vel: 2.38 cm2
AV Mean grad: 4 mmHg
AV Peak grad: 7.4 mmHg
Ao pk vel: 1.36 m/s
Area-P 1/2: 3.84 cm2
Height: 70 in
S' Lateral: 2.8 cm
Weight: 2163.2 [oz_av]

## 2023-07-30 LAB — HIV ANTIBODY (ROUTINE TESTING W REFLEX): HIV Screen 4th Generation wRfx: NONREACTIVE

## 2023-07-30 LAB — C-REACTIVE PROTEIN: CRP: 12.4 mg/dL — ABNORMAL HIGH (ref <1.0)

## 2023-07-30 LAB — SEDIMENTATION RATE: Sed Rate: 15 mm/h (ref 0–16)

## 2023-07-30 MED ORDER — METOPROLOL TARTRATE 25 MG PO TABS
25.0000 mg | ORAL_TABLET | Freq: Two times a day (BID) | ORAL | Status: DC
  Administered 2023-07-30 – 2023-07-31 (×2): 25 mg via ORAL
  Filled 2023-07-30 (×2): qty 1

## 2023-07-30 NOTE — Progress Notes (Signed)
 Echocardiogram 2D Echocardiogram has been performed.  [name] [name] 07/30/2023, 12:50 PM

## 2023-07-30 NOTE — Telephone Encounter (Signed)
 Patient Product/process development scientist completed.    The patient is insured through [employer]. Patient has [employer], may use a copay card, and/or apply for patient assistance if available.    Ran test claim for Eliquis 5 mg and the current 30 day co-pay is $551.48 due to a $5,500.00 deductible.   This test claim was processed through [facility]- copay amounts may vary at other pharmacies due to pharmacy/plan contracts, or as the patient moves through the different stages of their insurance plan.     Roland Earl, CPHT Pharmacy Technician III Certified Patient Advocate [facility] Pharmacy Patient Advocate Team Direct Number: [phone]  Fax: [phone]

## 2023-07-30 NOTE — Consult Note (Signed)
 Cardiology Consultation   Patient ID: [name] [name] MRN: [id number]; DOB: [birth year]  Admit date: 07/29/2023 Date of Consult: 07/30/2023  PCP:  Shelva Majestic, MD   [facility] Providers Cardiologist:  None        Patient Profile:   [name] [name] is a 63 y.o. male with a hx of HTN, obesity, depression, impetigo, intertigo, diverticulitis who is being seen 07/30/2023 for the evaluation of afib with RVR  at the request of Dr. Ashley Royalty.  History of Present Illness:   [name] [name] is a 22 YOM with the above medical history. Patient has never been seen by cardiology.   Presented to the ED with sharp chest pain that started while he was watching TV and hurt him to breathe.  No known history of heart or lung disease.  ED exam revealed tachycardia with irregularly irregular rhythm and EKG A-fib with RVR HR 157. He was treated with PO diltiazem, which improved HR to 100, but patient reported vague  chest tightness and nausea. He was also started on IV amio and Eliquis.  TSH: 0.663 MG: 1.8 A1C: 5.5 Cr 0.9 hsTN 5 >3, BNP 61.2. CXR: mild cardiomegaly.  CTA: no acute fidnings. Age advanced coronary artery arthrosclerosis, mild cardiomegaly, small pericardial effusion. ECHO pending.   Cardiology consulted. On interview, reported Tuesday am after making breakfast and watching news, he developed nausea, dizziness and tightness in chest 6/10 "like elephant sitting on chest" x 1.5 hrs constant, worse w/ deep breathe, better with sitting up.  Now this chest tightness has improved to 2/10 with IV amio. He did yard work Monday and at first related to MSK. Never had  anything like this before. Denied any palpitations, SOB, syncope. Noteable triggers include stress related to psoriasis mgmt since November, 1 glass of wine, 3-4x/weekly, 1 diet pepsi/daily and denies any recent illness or dehydration. Not previously diagnosed with afib. Risk factors include HTN, obesity,  tobacco use < 2-3 x per week;  denies any known DM, CAD, HF, Stroke, Thyroid disorder, Sleep apnea. Walks 30 mins approximately 3-4 x per week. Endorses a healthy diet and mainy home cooked meals.  Family history notable for father having MI and CABG at 44.    Past Medical History:  Diagnosis Date   Allergy    Boil 12/28/2021   Offered to lance, he declined Will use doxy for impetigo and he will use warm compresses.   Diverticulitis    leading to approx 1 foot resection-colectomy   History of kidney stones    Hx of adenomatous colonic polyps 10/19/2017   Hypertension    Impetigo any site 12/28/2021   Intertrigo 12/28/2021   NEPHROLITHIASIS, HX OF 03/09/2007   PONV (postoperative nausea and vomiting)    SPRAIN/STRAIN, ANKLE NOS 01/05/2007   Sweaty armpits 12/28/2021    Past Surgical History:  Procedure Laterality Date   COLECTOMY  09/2008   Dr Johna Sheriff   COLONOSCOPY     HERNIA REPAIR  09/18/12   lap incisional hernia repair, related to colectomy   VENTRAL HERNIA REPAIR N/A 09/18/2012     Home Medications:  Prior to Admission medications   Medication Sig Start Date End Date Taking? Authorizing Provider  amLODipine (NORVASC) 10 MG tablet Take 1 tablet (10 mg total) by mouth daily. Patient taking differently: Take 10 mg by mouth at bedtime. 06/11/23  Yes Shelva Majestic, MD  buPROPion (WELLBUTRIN XL) 150 MG 24 hr tablet TAKE 1 TABLET(150 MG) BY MOUTH DAILY Patient taking  differently: Take 150 mg by mouth at bedtime. 06/11/23  Yes Shelva Majestic, MD  ibuprofen (ADVIL,MOTRIN) 200 MG tablet Take 400 mg by mouth every 6 (six) hours as needed for mild pain (pain score 1-3) or headache.   Yes [provider]  olmesartan (BENICAR) 40 MG tablet TAKE 1 TABLET(40 MG) BY MOUTH DAILY Patient taking differently: Take 40 mg by mouth at bedtime. 02/25/23  Yes Shelva Majestic, MD  Secukinumab (COSENTYX UNOREADY) 300 MG/2ML SOAJ Inject 300 mg into the skin every 30 (thirty) days. Starting at week 4, inject one pen monthly  05/28/23  Yes Paci, Daisey Must, MD  aluminum chloride (DRYSOL) 20 % external solution Apply topically at bedtime. Patient not taking: Reported on 07/29/2023 12/28/21   Lula Olszewski, MD  Apremilast (OTEZLA) 4 x 10 & 51 x20 MG TBPK Take 1 tablet by mouth as directed. Patient not taking: Reported on 07/29/2023 03/24/23   Lula Olszewski, MD  calcipotriene (DOVONOX) 0.005 % ointment Apply topically 2 (two) times daily. Patient not taking: Reported on 07/29/2023 03/24/23   Lula Olszewski, MD  clobetasol (TEMOVATE) 0.05 % external solution Apply 1 Application topically 2 (two) times daily. Patient not taking: Reported on 07/29/2023 03/24/23   Lula Olszewski, MD  clobetasol cream (TEMOVATE) 0.05 % Apply 1 Application topically 2 (two) times daily. Do not use on genitals or face Patient not taking: Reported on 07/29/2023 03/24/23   Lula Olszewski, MD  doxycycline (VIBRA-TABS) 100 MG tablet Take 1 tablet (100 mg total) by mouth 2 (two) times daily. Patient not taking: Reported on 07/29/2023 01/11/22   Dulce Sellar, NP  hydrocortisone 1 % ointment Apply 1 Application topically 2 (two) times daily. For rash on face 14 day max Patient not taking: Reported on 07/29/2023 03/24/23   Lula Olszewski, MD  nystatin (MYCOSTATIN/NYSTOP) powder Apply 1 Application topically 3 (three) times daily. Patient not taking: Reported on 07/29/2023 12/28/21   Lula Olszewski, MD  predniSONE (DELTASONE) 20 MG tablet Take 2 pills for 3 days, 1 pill for 4 days Patient not taking: Reported on 07/29/2023 03/24/23   Lula Olszewski, MD  Secukinumab (COSENTYX UNOREADY) 300 MG/2ML SOAJ Inject 300 mg into the skin once a week. Weeks 0,1,2,3 Patient not taking: Reported on 07/29/2023 05/28/23   Gwenith Daily, MD  triamcinolone cream (KENALOG) 0.1 % Apply 1 application. topically 2 (two) times daily. For 7-10 days maximum Patient not taking: Reported on 03/24/2023 09/04/21   Shelva Majestic, MD  triamcinolone ointment (KENALOG)  0.1 % Apply 1 Application topically 2 (two) times daily. Patient not taking: Reported on 07/29/2023 05/28/23   Gwenith Daily, MD    Inpatient Medications: Scheduled Meds:  amLODipine  10 mg Oral Daily   apixaban  5 mg Oral BID   buPROPion  150 mg Oral QHS   irbesartan  300 mg Oral Daily   Continuous Infusions:  amiodarone 30 mg/hr (07/30/23 0735)   PRN Meds: acetaminophen **OR** acetaminophen, albuterol, ondansetron **OR** ondansetron (ZOFRAN) IV, traZODone  Allergies:    Allergies  Allergen Reactions   Benadryl [Diphenhydramine Hcl] Itching and Other (See Comments)    Patient stated allergy after ordered by MD   Morphine And Codeine Itching    Social History:   Social History   Socioeconomic History   Marital status: Single    Spouse name: Not on file   Number of children: Not on file   Years of education: Not on file  Highest education level: Not on file  Occupational History   Not on file  Tobacco Use   Smoking status: Former    Current packs/day: 0.00    Types: Cigarettes, E-cigarettes    Quit date: 05/13/2016    Years since quitting: 7.2   Smokeless tobacco: Never   Tobacco comments:    Doing Vape in the evenings  Vaping Use   Vaping status: Former  Substance and Sexual Activity   Alcohol use: Yes    Alcohol/week: 0.0 standard drinks of alcohol    Comment: occasionally   Drug use: Not Currently   Sexual activity: Never  Other Topics Concern   Not on file  Social History Narrative   Single. Lives alone. Monogamous.    Long term friend of Dr. 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.       Hobbies: working around [employer], mountain bike   Social Drivers of Corporate investment banker Strain: Not on file  Food Insecurity: No Food Insecurity (07/29/2023)   Hunger Vital Sign    Worried About Running Out of Food in the Last Year: Never true    Ran Out of Food in the Last Year: Never true   Transportation Needs: No Transportation Needs (07/29/2023)   PRAPARE - Administrator, Civil Service (Medical): No    Lack of Transportation (Non-Medical): No  Physical Activity: Not on file  Stress: Not on file  Social Connections: Not on file  Intimate Partner Violence: Not At Risk (07/29/2023)   Humiliation, Afraid, Rape, and Kick questionnaire    Fear of Current or Ex-Partner: No    Emotionally Abused: No    Physically Abused: No    Sexually Abused: No    Family History:   Family History  Problem Relation Age of Onset   CVA Mother        22   Heart attack Father        22   Sleep apnea Father        noncompliant with CPAP   Pulmonary Hypertension Father        related to cpap noncompliance. died 25 sepsis    Colon cancer Neg Hx    Colon polyps Neg Hx    Esophageal cancer Neg Hx    Rectal cancer Neg Hx    Stomach cancer Neg Hx      ROS:  Please see the history of present illness.  All other ROS reviewed and negative.     Physical Exam/Data:   Vitals:   07/29/23 2349 07/30/23 0117 07/30/23 0511 07/30/23 0908  BP:  126/79 111/79 120/76  Pulse:  (!) 101 (!) 109   Resp:  18 16   Temp:  98 F (36.7 C) 98.2 F (36.8 C)   TempSrc:      SpO2:  94% 96%   Weight: 61.3 kg     Height:        Intake/Output Summary (Last 24 hours) at 07/30/2023 0923 Last data filed at 07/30/2023 0844 Gross per 24 hour  Intake 460.04 ml  Output --  Net 460.04 ml      07/29/2023   11:49 PM 03/24/2023    1:07 PM 01/11/2022   11:38 AM  Last 3 Weights  Weight (lbs) 135 lb 3.2 oz -- 285 lb  Weight (kg) 61.326 kg -- 129.275 kg     Body mass index is 19.4 kg/m.  General:  Laying in bed with no acute distress HEENT: normal Neck: no JVD Vascular: No carotid bruits; Distal pulses 2+ bilaterally Cardiac:  normal S1, S2; irregular irregular; no murmur  Lungs:  clear to auscultation bilaterally, no wheezing, rhonchi or rales  Abd: soft, nontender, no hepatomegaly  Ext: no  edema Musculoskeletal:  No deformities, BUE and BLE strength normal and equal Skin: warm and dry  Neuro:  CNs 2-12 intact, no focal abnormalities noted Psych:  Normal affect   EKG:  The EKG was personally reviewed and demonstrates:  Afib, HR 87, poor R wave progression, low voltage, no acute ischemic changes (compared to 06/2017 NSR)  Telemetry:  Telemetry was personally reviewed and demonstrates:  A fib HR 80-90's with occasional 110  Relevant CV Studies: ECHO pending   Laboratory Data:  High Sensitivity Troponin:   Recent Labs  Lab 07/29/23 1210 07/29/23 1420  TROPONINIHS 4 3     Chemistry Recent Labs  Lab 07/29/23 1210 07/29/23 1242 07/29/23 2038  NA 134* 135  --   K 4.1 4.3  --   CL 103 105  --   CO2 19*  --   --   GLUCOSE 120* 113*  --   BUN 16 16  --   CREATININE 1.03 0.90  --   CALCIUM 8.8*  --   --   MG  --   --  1.8  GFRNONAA >60  --   --   ANIONGAP 12  --   --     No results for input(s): "PROT", "ALBUMIN", "AST", "ALT", "ALKPHOS", "BILITOT" in the last 168 hours. Lipids No results for input(s): "CHOL", "TRIG", "HDL", "LABVLDL", "LDLCALC", "CHOLHDL" in the last 168 hours.  Hematology Recent Labs  Lab 07/29/23 1210 07/29/23 1242  WBC 12.9*  --   RBC 5.07  --   HGB 17.8* 17.7*  HCT 51.4 52.0  MCV 101.4*  --   MCH 35.1*  --   MCHC 34.6  --   RDW 12.3  --   PLT 241  --    Thyroid  Recent Labs  Lab 07/29/23 2038  TSH 0.663    BNP Recent Labs  Lab 07/29/23 1420  BNP 61.2    DDimer No results for input(s): "DDIMER" in the last 168 hours.   Radiology/Studies:  CT Angio Chest Aorta W and/or Wo Contrast Result Date: 07/29/2023 CLINICAL DATA:  Acute aortic syndrome (AAS) suspected Central chest pain since this morning. EXAM: CT ANGIOGRAPHY CHEST WITH CONTRAST TECHNIQUE: Multidetector CT imaging of the chest was performed using the standard protocol during bolus administration of intravenous contrast. Pre contrast imaging was performed.  Multiplanar CT image reconstructions and MIPs were obtained to evaluate the vascular anatomy. RADIATION DOSE REDUCTION: This exam was performed according to the departmental dose-optimization program which includes automated exposure control, adjustment of the mA and/or kV according to patient size and/or use of iterative reconstruction technique. CONTRAST:  75mL OMNIPAQUE IOHEXOL 350 MG/ML SOLN COMPARISON:  Chest radiographs 07/29/2023 and 09/11/2012. Abdominal CT 08/03/2020. FINDINGS: Cardiovascular: Pre contrast images demonstrate age advanced coronary artery atherosclerosis. Relatively mild aortic and great vessel atherosclerosis. No evidence of aortic intramural hematoma, aneurysm or dissection. No evidence for acute pulmonary embolism. The right brachiocephalic and right common carotid arteries share a common origin from the aortic arch. Vestigial left vertebral artery arises directly from the aortic arch. The heart is mildly enlarged. There is a small amount of pericardial fluid. Mediastinum/Nodes: There are no enlarged mediastinal, hilar or axillary lymph  nodes. The thyroid gland, trachea and esophagus demonstrate no significant findings. Lungs/Pleura: No pleural effusion or pneumothorax. Mild dependent atelectasis at both lung bases. No confluent airspace disease or suspicious pulmonary nodularity. Upper abdomen: No acute findings are seen in the visualized upper abdomen. Musculoskeletal/Chest wall: There is no chest wall mass or suspicious osseous finding. Mild spondylosis with prominent paraspinal osteophytes. Review of the MIP images confirms the above findings. IMPRESSION: 1. No evidence of acute aortic syndrome or other acute chest findings. 2. Age advanced coronary artery atherosclerosis. 3. Mild cardiomegaly and small pericardial effusion. 4.  Aortic Atherosclerosis (ICD10-I70.0). Electronically Signed   By: Carey Bullocks M.D.   On: 07/29/2023 16:49   DG Chest Portable 1 View Result Date:  07/29/2023 CLINICAL DATA:  Chest pain. EXAM: PORTABLE CHEST 1 VIEW COMPARISON:  Sep 11, 2012. FINDINGS: Mild cardiomegaly is noted. Both lungs are clear. The visualized skeletal structures are unremarkable. IMPRESSION: No active disease. Electronically Signed   By: Lupita Raider M.D.   On: 07/29/2023 14:59     Assessment and Plan:   Afib Chest pain Possible Pericarditis Presented to ED with chest tightness 6/10 x 1.5 hr, constant,  "elephant sitting on chest,"  worse with deep breaths, better with sitting up. ED EKG: Afib with RVR, HR 157. Treated with IV dilt and improved HR 100 but persisent chest tightness and nausea. Treated with IV amio and Eliquis and now notes some improvement 2/10. Never had this before. Not previously diagnosed with afib. TSH: 0.663, MG: 1.8, A1C: 5.5, Cr 0.9, hsTN 5 >3, BNP 61.2. CXR: mild cardiomegaly.   - CTA: no acute fidnings. Age advanced coronary artery arthrosclerosis, mild cardiomegaly, small pericardial effusion - EKG: Afib, HR 87, poor R wave progression, low voltage, no acute ischemic changes (compared to 06/2017 NSR)  - Telemetry: A fib HR 80-90's with occasional 110 - Noteable triggers: stress related to psoriasis mgmt since November, 1 glass of wine, 3-4x/weekly, 1 diet pepsi/daily and denies any recent illness or dehydration. - Risk factors: HTN, obesity, tobacco use < 2-3 x per week; denies any known DM, CAD, HF, Stroke, Thyroid disorder, Sleep apnea.   - Family history notable for father having MI and CABG at 66. - Currently treated with IV amino, Eliquis and Amlodipine (Eliquis is not cost efficient, however insurance will change in April and suspect lower cost. Will plan to utilize pharm assistance program until April.)  - ECHO pending. Given poor anticoagulation, amino is not a good option. Will plan d/c amino. Will need to continue rate control and anticoagulation. Based on echo results, medications will be managed and decision for cardioversion.  -  with classic pericarditis s/s and pericardial effusion, ordered ESR and CRP - in the setting of atypical chest pain, negative troponin, and no ischemic changes on EKG, no ischemic evaluation at this time. Will continue to assess. Can plan for outpatient stress testing. * will discuss with MD * - He denies snoring but with obesity, can consider outpatient sleep apnea testing  Hypertension - managed well 120/76 - continue home olmesartan and amlodipine   Depression  - home med: Wellbutrin    Risk Assessment/Risk Scores:   CHA2DS2-VASc Score = 2  This indicates a 2.2% annual risk of stroke. The patient's score is based upon: CHF History: 0 HTN History: 1 Diabetes History: 0 Stroke History: 0 Vascular Disease History: 1 Age Score: 0 Gender Score: 0      For questions or updates, please contact [facility] Please  consult [email or web address] for contact info under    Signed, Basilio Cairo, PA-C  07/30/2023 9:23 AM

## 2023-07-30 NOTE — Progress Notes (Signed)
 PROGRESS NOTE    [name] [name]  ZOX:[id number] DOB: [birth year] DOA: 07/29/2023 PCP: Shelva Majestic, MD Brief Narrative:  63 year old male with history of hypertension depression psoriasis recently started on Cosentyx injections shots, admitted with chest tightness found to be in new onset A-fib RVR.  Initially treated with diltiazem with improvement somewhat then started on amiodarone.  CT chest unremarkable, Assessment & Plan:   Principal Problem:   Atrial fibrillation with rapid ventricular response (HCC)   #1 new onset A-fib with RVR admitted with complaints of chest tightness and nausea.  He was started on amnio drip and Eliquis ?  Cosentyx shots has precipitated this Cardiology consulted, await recommendations Echocardiogram -EF 65 to 70% mild LVH TSH 0.663 CT angiogram no acute findings noted, age advanced coronary artery atherosclerosis small pericardial effusion and mild cardiomegaly, EKG in A-fib. Cardiology has ordered sed rate and CRP for possible?  Pericarditis #2 hypertension stable on Norvasc and olmesartan #3 depression continue Wellbutrin   Estimated body mass index is 19.4 kg/m as calculated from the following:   Height as of this encounter: 5\' 10"  (1.778 m).   Weight as of this encounter: 61.3 kg.  DVT prophylaxis: eliquis Code Status: full Family Communication: none Disposition Plan:  Status is: Observation   Consultants: cards  Procedures: none Antimicrobials none  Subjective: Is a lot better than yesterday however still has some chest tightness and nausea  Objective: Vitals:   07/29/23 2100 07/29/23 2349 07/30/23 0117 07/30/23 0511  BP:   126/79 111/79  Pulse:   (!) 101 (!) 109  Resp: 13  18 16   Temp:   98 F (36.7 C) 98.2 F (36.8 C)  TempSrc:      SpO2:   94% 96%  Weight:  61.3 kg    Height:        Intake/Output Summary (Last 24 hours) at 07/30/2023 0824 Last data filed at 07/29/2023 2300 Gross per 24 hour  Intake 220.04 ml  Output  --  Net 220.04 ml   Filed Weights   07/29/23 2349  Weight: 61.3 kg    Examination:  General exam: Appears no distress  respiratory system: Clear to auscultation. Respiratory effort normal. Cardiovascular system: Irregular Gastrointestinal system: Abdomen is nondistended, soft and nontender. No organomegaly or masses felt. Normal bowel sounds heard. Central nervous system: Alert and oriented. No focal neurological deficits. Extremities: no edema  Data Reviewed: I have personally reviewed following labs and imaging studies  CBC: Recent Labs  Lab 07/29/23 1210 07/29/23 1242  WBC 12.9*  --   HGB 17.8* 17.7*  HCT 51.4 52.0  MCV 101.4*  --   PLT 241  --    Basic Metabolic Panel: Recent Labs  Lab 07/29/23 1210 07/29/23 1242 07/29/23 2038  NA 134* 135  --   K 4.1 4.3  --   CL 103 105  --   CO2 19*  --   --   GLUCOSE 120* 113*  --   BUN 16 16  --   CREATININE 1.03 0.90  --   CALCIUM 8.8*  --   --   MG  --   --  1.8   GFR: Estimated Creatinine Clearance: 71.9 mL/min (by C-G formula based on SCr of 0.9 mg/dL). Liver Function Tests: No results for input(s): "AST", "ALT", "ALKPHOS", "BILITOT", "PROT", "ALBUMIN" in the last 168 hours. No results for input(s): "LIPASE", "AMYLASE" in the last 168 hours. No results for input(s): "AMMONIA" in the last 168 hours. Coagulation Profile: No  results for input(s): "INR", "PROTIME" in the last 168 hours. Cardiac Enzymes: No results for input(s): "CKTOTAL", "CKMB", "CKMBINDEX", "TROPONINI" in the last 168 hours. BNP (last 3 results) No results for input(s): "PROBNP" in the last 8760 hours. HbA1C: No results for input(s): "HGBA1C" in the last 72 hours. CBG: Recent Labs  Lab 07/29/23 1246  GLUCAP 108*   Lipid Profile: No results for input(s): "CHOL", "HDL", "LDLCALC", "TRIG", "CHOLHDL", "LDLDIRECT" in the last 72 hours. Thyroid Function Tests: Recent Labs    07/29/23 2038  TSH 0.663   Anemia Panel: No results for  input(s): "VITAMINB12", "FOLATE", "FERRITIN", "TIBC", "IRON", "RETICCTPCT" in the last 72 hours. Sepsis Labs: No results for input(s): "PROCALCITON", "LATICACIDVEN" in the last 168 hours.  No results found for this or any previous visit (from the past 240 hours).       Radiology Studies: CT Angio Chest Aorta W and/or Wo Contrast Result Date: 07/29/2023 CLINICAL DATA:  Acute aortic syndrome (AAS) suspected Central chest pain since this morning. EXAM: CT ANGIOGRAPHY CHEST WITH CONTRAST TECHNIQUE: Multidetector CT imaging of the chest was performed using the standard protocol during bolus administration of intravenous contrast. Pre contrast imaging was performed. Multiplanar CT image reconstructions and MIPs were obtained to evaluate the vascular anatomy. RADIATION DOSE REDUCTION: This exam was performed according to the departmental dose-optimization program which includes automated exposure control, adjustment of the mA and/or kV according to patient size and/or use of iterative reconstruction technique. CONTRAST:  75mL OMNIPAQUE IOHEXOL 350 MG/ML SOLN COMPARISON:  Chest radiographs 07/29/2023 and 09/11/2012. Abdominal CT 08/03/2020. FINDINGS: Cardiovascular: Pre contrast images demonstrate age advanced coronary artery atherosclerosis. Relatively mild aortic and great vessel atherosclerosis. No evidence of aortic intramural hematoma, aneurysm or dissection. No evidence for acute pulmonary embolism. The right brachiocephalic and right common carotid arteries share a common origin from the aortic arch. Vestigial left vertebral artery arises directly from the aortic arch. The heart is mildly enlarged. There is a small amount of pericardial fluid. Mediastinum/Nodes: There are no enlarged mediastinal, hilar or axillary lymph nodes. The thyroid gland, trachea and esophagus demonstrate no significant findings. Lungs/Pleura: No pleural effusion or pneumothorax. Mild dependent atelectasis at both lung bases. No  confluent airspace disease or suspicious pulmonary nodularity. Upper abdomen: No acute findings are seen in the visualized upper abdomen. Musculoskeletal/Chest wall: There is no chest wall mass or suspicious osseous finding. Mild spondylosis with prominent paraspinal osteophytes. Review of the MIP images confirms the above findings. IMPRESSION: 1. No evidence of acute aortic syndrome or other acute chest findings. 2. Age advanced coronary artery atherosclerosis. 3. Mild cardiomegaly and small pericardial effusion. 4.  Aortic Atherosclerosis (ICD10-I70.0). Electronically Signed   By: Carey Bullocks M.D.   On: 07/29/2023 16:49   DG Chest Portable 1 View Result Date: 07/29/2023 CLINICAL DATA:  Chest pain. EXAM: PORTABLE CHEST 1 VIEW COMPARISON:  Sep 11, 2012. FINDINGS: Mild cardiomegaly is noted. Both lungs are clear. The visualized skeletal structures are unremarkable. IMPRESSION: No active disease. Electronically Signed   By: Lupita Raider M.D.   On: 07/29/2023 14:59    Scheduled Meds:  amLODipine  10 mg Oral Daily   apixaban  5 mg Oral BID   buPROPion  150 mg Oral QHS   irbesartan  300 mg Oral Daily   Continuous Infusions:  amiodarone 30 mg/hr (07/30/23 0735)     LOS: 0 days    Time spent: 39 min  Alwyn Ren, MD 07/30/2023, 8:24 AM

## 2023-07-30 NOTE — Plan of Care (Signed)

## 2023-07-30 NOTE — Progress Notes (Signed)
   07/30/23 0910  TOC Brief Assessment  Insurance and Status Reviewed  Patient has primary care physician Yes  Home environment has been reviewed Resides alone in single family home  Prior level of function: Independent with ADLs at baseline  Prior/Current Home Services No current home services  Social Drivers of Health Review SDOH reviewed no interventions necessary  Readmission risk has been reviewed Yes  Transition of care needs no transition of care needs at this time

## 2023-07-31 ENCOUNTER — Ambulatory Visit: Payer: Commercial Managed Care - HMO | Admitting: Dermatology

## 2023-07-31 DIAGNOSIS — I2510 Atherosclerotic heart disease of native coronary artery without angina pectoris: Secondary | ICD-10-CM

## 2023-07-31 DIAGNOSIS — I4891 Unspecified atrial fibrillation: Secondary | ICD-10-CM | POA: Diagnosis not present

## 2023-07-31 DIAGNOSIS — R079 Chest pain, unspecified: Secondary | ICD-10-CM | POA: Diagnosis not present

## 2023-07-31 DIAGNOSIS — I10 Essential (primary) hypertension: Secondary | ICD-10-CM | POA: Diagnosis not present

## 2023-07-31 LAB — LIPID PANEL
Cholesterol: 150 mg/dL (ref 0–200)
HDL: 67 mg/dL (ref >40)
LDL Cholesterol: 70 mg/dL (ref 0–99)
Total CHOL/HDL Ratio: 2.2 ratio
Triglycerides: 66 mg/dL (ref <150)
VLDL: 13 mg/dL (ref 0–40)

## 2023-07-31 LAB — C-REACTIVE PROTEIN: CRP: 11.3 mg/dL — ABNORMAL HIGH (ref <1.0)

## 2023-07-31 LAB — SEDIMENTATION RATE: Sed Rate: 38 mm/h — ABNORMAL HIGH (ref 0–16)

## 2023-07-31 MED ORDER — ROSUVASTATIN CALCIUM 5 MG PO TABS
5.0000 mg | ORAL_TABLET | Freq: Every day | ORAL | Status: DC
  Administered 2023-07-31: 5 mg via ORAL
  Filled 2023-07-31: qty 1

## 2023-07-31 MED ORDER — ROSUVASTATIN CALCIUM 5 MG PO TABS: 5.0000 mg | ORAL_TABLET | Freq: Every day | ORAL | 4 refills | Status: DC

## 2023-07-31 MED ORDER — APIXABAN 5 MG PO TABS: 5.0000 mg | ORAL_TABLET | Freq: Two times a day (BID) | ORAL | 4 refills | Status: DC

## 2023-07-31 MED ORDER — METOPROLOL TARTRATE 25 MG PO TABS: 25.0000 mg | ORAL_TABLET | Freq: Two times a day (BID) | ORAL | 4 refills | Status: DC

## 2023-07-31 NOTE — Progress Notes (Signed)
 The patient states that he was told he is discharging. Pt removed telemetry despite request to hold off until orders placed.

## 2023-07-31 NOTE — Plan of Care (Signed)

## 2023-07-31 NOTE — Progress Notes (Signed)
 The patient is stable. He is alert,oriented x4 and ambulatory without assistance. Discharge instructions were reviewed. Questions, concerns were denied at this time.

## 2023-07-31 NOTE — Progress Notes (Signed)
 The patient ambulated in the hallway approximately 150 feet. Activity tolerated well. The pt states that he experienced mild shortness of breath but feels its due to being immobile during admission. Gait remained steady, oxygen saturation on room air 91-93%. He denies dizziness, weakness, chest pain or worsening SOB. Provider updated.

## 2023-07-31 NOTE — Progress Notes (Addendum)
 Rounding Note    Patient Name: [name] [name] Date of Encounter: 07/31/2023  [facility] Cardiologist: None   Subjective   This am, reports significant improvement with chest pain since starting amio. Denies any other symptoms.    Inpatient Medications    Scheduled Meds:  apixaban  5 mg Oral BID   buPROPion  150 mg Oral QHS   metoprolol tartrate  25 mg Oral BID   Continuous Infusions:  amiodarone 30 mg/hr (07/30/23 2313)   PRN Meds: acetaminophen **OR** acetaminophen, albuterol, ondansetron **OR** ondansetron (ZOFRAN) IV, traZODone   Vital Signs    Vitals:   07/30/23 1000 07/30/23 1300 07/30/23 2011 07/31/23 0533  BP: 111/74 109/77 131/86 113/82  Pulse: 94 95 (!) 101 81  Resp: 20 (!) 23 15 16   Temp: 98.2 F (36.8 C) 98.3 F (36.8 C) 98.3 F (36.8 C) 98.3 F (36.8 C)  TempSrc: Oral Oral    SpO2: 95% 96% 96% 95%  Weight:      Height:        Intake/Output Summary (Last 24 hours) at 07/31/2023 0732 Last data filed at 07/30/2023 0844 Gross per 24 hour  Intake 240 ml  Output --  Net 240 ml      07/29/2023   11:49 PM 03/24/2023    1:07 PM 01/11/2022   11:38 AM  Last 3 Weights  Weight (lbs) 135 lb 3.2 oz -- 285 lb  Weight (kg) 61.326 kg -- 129.275 kg      Telemetry    Afib, HR 70-80's with occasional 100's - Personally Reviewed  ECG    None new - Personally Reviewed  Physical Exam   GEN: Laying in bed in No acute distress.   Neck: No JVD Cardiac: irregularly irregular , no murmurs, rubs, or gallops.  Respiratory: Clear to auscultation bilaterally. GI: Soft, nontender, non-distended  MS: No edema; No deformity. Neuro:  Nonfocal  Psych: Normal affect   Labs    High Sensitivity Troponin:   Recent Labs  Lab 07/29/23 1210 07/29/23 1420  TROPONINIHS 4 3     Chemistry Recent Labs  Lab 07/29/23 1210 07/29/23 1242 07/29/23 2038  NA 134* 135  --   K 4.1 4.3  --   CL 103 105  --   CO2 19*  --   --   GLUCOSE 120* 113*  --   BUN  16 16  --   CREATININE 1.03 0.90  --   CALCIUM 8.8*  --   --   MG  --   --  1.8  GFRNONAA >60  --   --   ANIONGAP 12  --   --     Lipids  Recent Labs  Lab 07/31/23 0531  CHOL 150  TRIG 66  HDL 67  LDLCALC 70  CHOLHDL 2.2    Hematology Recent Labs  Lab 07/29/23 1210 07/29/23 1242  WBC 12.9*  --   RBC 5.07  --   HGB 17.8* 17.7*  HCT 51.4 52.0  MCV 101.4*  --   MCH 35.1*  --   MCHC 34.6  --   RDW 12.3  --   PLT 241  --    Thyroid  Recent Labs  Lab 07/29/23 2038  TSH 0.663    BNP Recent Labs  Lab 07/29/23 1420  BNP 61.2    DDimer No results for input(s): "DDIMER" in the last 168 hours.   Radiology    ECHOCARDIOGRAM COMPLETE Result Date: 07/30/2023  ECHOCARDIOGRAM REPORT   Patient Name:   [name] [name] Date of Exam: 07/30/2023 Medical Rec #:  [id number]      Height:       70.0 in Accession #:    [id number]     Weight:       135.2 lb Date of Birth:  [birth year]      BSA:          1.767 m Patient Age:    63 years       BP:           101/75 mmHg Patient Gender: M              HR:           93 bpm. Exam Location:  Inpatient Procedure: 2D Echo, Cardiac Doppler and Color Doppler (Both Spectral and Color            Flow Doppler were utilized during procedure). Indications:    Atrial Fibrillation  History:        Patient has no prior history of Echocardiogram examinations.                 Arrythmias:Atrial Fibrillation; Risk Factors:Hypertension and                 Former Smoker.  Sonographer:    Karma Ganja Referring Phys: [id number] MIR M [facility] IMPRESSIONS  1. Left ventricular ejection fraction, by estimation, is 65 to 70%. The left ventricle has normal function. The left ventricle has no regional wall motion abnormalities. There is mild concentric left ventricular hypertrophy. Left ventricular diastolic parameters are indeterminate. Elevated left ventricular end-diastolic pressure.  2. Right ventricular systolic function is normal. The right ventricular size is normal. There  is normal pulmonary artery systolic pressure.  3. The mitral valve is normal in structure. Trivial mitral valve regurgitation. No evidence of mitral stenosis.  4. The aortic valve is normal in structure. Aortic valve regurgitation is not visualized. No aortic stenosis is present.  5. The inferior vena cava is normal in size with greater than 50% respiratory variability, suggesting right atrial pressure of 3 mmHg. FINDINGS  Left Ventricle: Left ventricular ejection fraction, by estimation, is 65 to 70%. The left ventricle has normal function. The left ventricle has no regional wall motion abnormalities. The left ventricular internal cavity size was normal in size. There is  mild concentric left ventricular hypertrophy. Left ventricular diastolic function could not be evaluated due to atrial fibrillation. Left ventricular diastolic parameters are indeterminate. Elevated left ventricular end-diastolic pressure. Right Ventricle: The right ventricular size is normal. No increase in right ventricular wall thickness. Right ventricular systolic function is normal. There is normal pulmonary artery systolic pressure. The tricuspid regurgitant velocity is 2.20 m/s, and  with an assumed right atrial pressure of 3 mmHg, the estimated right ventricular systolic pressure is 22.4 mmHg. Left Atrium: Left atrial size was normal in size. Right Atrium: Right atrial size was normal in size. Pericardium: There is no evidence of pericardial effusion. Mitral Valve: The mitral valve is normal in structure. Trivial mitral valve regurgitation. No evidence of mitral valve stenosis. Tricuspid Valve: The tricuspid valve is normal in structure. Tricuspid valve regurgitation is trivial. No evidence of tricuspid stenosis. Aortic Valve: The aortic valve is normal in structure. Aortic valve regurgitation is not visualized. No aortic stenosis is present. Aortic valve mean gradient measures 4.0 mmHg. Aortic valve peak gradient measures 7.4 mmHg. Aortic  valve area, by VTI measures 2.64 cm. Pulmonic  Valve: The pulmonic valve was normal in structure. Pulmonic valve regurgitation is not visualized. No evidence of pulmonic stenosis. Aorta: The aortic root is normal in size and structure. Venous: The inferior vena cava is normal in size with greater than 50% respiratory variability, suggesting right atrial pressure of 3 mmHg. IAS/Shunts: No atrial level shunt detected by color flow Doppler.  LEFT VENTRICLE PLAX 2D LVIDd:         4.50 cm   Diastology LVIDs:         2.80 cm   LV e' medial:    5.77 cm/s LV PW:         1.30 cm   LV E/e' medial:  18.0 LV IVS:        1.20 cm   LV e' lateral:   7.40 cm/s LVOT diam:     2.20 cm   LV E/e' lateral: 14.1 LV SV:         55 LV SV Index:   31 LVOT Area:     3.80 cm  RIGHT VENTRICLE             IVC RV Basal diam:  5.10 cm     IVC diam: 1.50 cm RV S prime:     15.00 cm/s TAPSE (M-mode): 3.0 cm LEFT ATRIUM              Index        RIGHT ATRIUM           Index LA diam:        5.20 cm  2.94 cm/m   RA Area:     42.10 cm LA Vol (A2C):   98.3 ml  55.62 ml/m  RA Volume:   177.00 ml 100.15 ml/m LA Vol (A4C):   108.0 ml 61.11 ml/m LA Biplane Vol: 103.0 ml 58.28 ml/m  AORTIC VALVE AV Area (Vmax):    2.45 cm AV Area (Vmean):   2.38 cm AV Area (VTI):     2.64 cm AV Vmax:           136.00 cm/s AV Vmean:          95.000 cm/s AV VTI:            0.207 m AV Peak Grad:      7.4 mmHg AV Mean Grad:      4.0 mmHg LVOT Vmax:         87.80 cm/s LVOT Vmean:        59.600 cm/s LVOT VTI:          0.144 m LVOT/AV VTI ratio: 0.70  AORTA Ao Root diam: 3.70 cm Ao Asc diam:  3.40 cm MITRAL VALVE                TRICUSPID VALVE MV Area (PHT): 3.84 cm     TR Peak grad:   19.4 mmHg MV Decel Time: 197 msec     TR Vmax:        220.00 cm/s MV E velocity: 104.00 cm/s                             SHUNTS                             Systemic VTI:  0.14 m  Systemic Diam: 2.20 cm Chilton Si MD Electronically signed by Chilton Si  MD Signature Date/Time: 07/30/2023/2:00:39 PM    Final    CT Angio Chest Aorta W and/or Wo Contrast Result Date: 07/29/2023 CLINICAL DATA:  Acute aortic syndrome (AAS) suspected Central chest pain since this morning. EXAM: CT ANGIOGRAPHY CHEST WITH CONTRAST TECHNIQUE: Multidetector CT imaging of the chest was performed using the standard protocol during bolus administration of intravenous contrast. Pre contrast imaging was performed. Multiplanar CT image reconstructions and MIPs were obtained to evaluate the vascular anatomy. RADIATION DOSE REDUCTION: This exam was performed according to the departmental dose-optimization program which includes automated exposure control, adjustment of the mA and/or kV according to patient size and/or use of iterative reconstruction technique. CONTRAST:  75mL OMNIPAQUE IOHEXOL 350 MG/ML SOLN COMPARISON:  Chest radiographs 07/29/2023 and 09/11/2012. Abdominal CT 08/03/2020. FINDINGS: Cardiovascular: Pre contrast images demonstrate age advanced coronary artery atherosclerosis. Relatively mild aortic and great vessel atherosclerosis. No evidence of aortic intramural hematoma, aneurysm or dissection. No evidence for acute pulmonary embolism. The right brachiocephalic and right common carotid arteries share a common origin from the aortic arch. Vestigial left vertebral artery arises directly from the aortic arch. The heart is mildly enlarged. There is a small amount of pericardial fluid. Mediastinum/Nodes: There are no enlarged mediastinal, hilar or axillary lymph nodes. The thyroid gland, trachea and esophagus demonstrate no significant findings. Lungs/Pleura: No pleural effusion or pneumothorax. Mild dependent atelectasis at both lung bases. No confluent airspace disease or suspicious pulmonary nodularity. Upper abdomen: No acute findings are seen in the visualized upper abdomen. Musculoskeletal/Chest wall: There is no chest wall mass or suspicious osseous finding. Mild spondylosis  with prominent paraspinal osteophytes. Review of the MIP images confirms the above findings. IMPRESSION: 1. No evidence of acute aortic syndrome or other acute chest findings. 2. Age advanced coronary artery atherosclerosis. 3. Mild cardiomegaly and small pericardial effusion. 4.  Aortic Atherosclerosis (ICD10-I70.0). Electronically Signed   By: Carey Bullocks M.D.   On: 07/29/2023 16:49   DG Chest Portable 1 View Result Date: 07/29/2023 CLINICAL DATA:  Chest pain. EXAM: PORTABLE CHEST 1 VIEW COMPARISON:  Sep 11, 2012. FINDINGS: Mild cardiomegaly is noted. Both lungs are clear. The visualized skeletal structures are unremarkable. IMPRESSION: No active disease. Electronically Signed   By: Lupita Raider M.D.   On: 07/29/2023 14:59    Cardiac Studies  ECHO IMPRESSIONS   1. Left ventricular ejection fraction, by estimation, is 65 to 70%. The  left ventricle has normal function. The left ventricle has no regional  wall motion abnormalities. There is mild concentric left ventricular  hypertrophy. Left ventricular diastolic  parameters are indeterminate. Elevated left ventricular end-diastolic  pressure.   2. Right ventricular systolic function is normal. The right ventricular  size is normal. There is normal pulmonary artery systolic pressure.   3. The mitral valve is normal in structure. Trivial mitral valve  regurgitation. No evidence of mitral stenosis.   4. The aortic valve is normal in structure. Aortic valve regurgitation is  not visualized. No aortic stenosis is present.   5. The inferior vena cava is normal in size with greater than 50%  respiratory variability, suggesting right atrial pressure of 3 mmHg.   Patient Profile     63 y.o. male  with a hx of HTN, obesity, depression, impetigo, intertigo, diverticulitis who is being seen 07/30/2023 for the evaluation of new onset afib with RVR  at the request of Dr. Ashley Royalty.  Assessment & Plan  Atrial fibrillation  NEW dx   Pt presented  with  high heart rates    Better now    He does not sense racing   Echo shows severe biatrial enlargement    LVEF and RVEF normal Very difficult study   Would repeat after cardioversion (as outpt) TSH normal   Currently rates are OK on low dose b blocker   AMbulate and follow    If overall average HR less than 100 can go home    CHADSVASc score is 2    ON Eliquis  Plan for DCCV in 4 wks   Will make appt in 2 wks for clinic   Pt should have a sleep evaluation     2.  Chest pain  Pressure sensation, like "elephant on chest"  Worse with deep inspiration.  Improved with HR control  NOw denies     Mild elevation in CRP but ESR normal   CT with very small pericardial effusion   OVerall not suggestive of pericarditis clnically Troponin negative despite prolonged discomfort   Does not sound ischemic  Would follow   AMbulate   IF OK can go      3  CAD   Demonstrated on CT scan     Risk factors:   HTN, obesity, tobacco abuse, fHx  Will need risk factor modification  (BP, lipids)  Reviewed diet  Follow as outpt    4  Hypertension -  home meds: olmesartan and amlodipine  Amlodipine has been stopped  Olmesartan has been stopped  Currently on b blocker for HR control lopressor 25 bid      5  LIpids    LDL is 70  HDL 67   Trig 66   With CAD would start statin  Start Crestor 5 mg    Follow up lipomed as outpt    6 Depression  - home med: Wellbutrin     For questions or updates, please contact [facility] Please consult [email or web address] for contact info under        Signed, Basilio Cairo, PA-C  07/31/2023, 7:32 AM     PT seen and examined   I have amended note / assessment abovey by Lajuana Ripple to reflect my findings   Pt is a 63 yo who presented with CP and Afib with RVR  CP was sharp, improved when patients heart rate slowed  He is currently comfortable with no CP   He denies palpitations     ON exam, pt comfortable in bed Neck   JVP difficult to assess as neck is full Lungs  are CTA Cardiac Irreg irreg  No S3   Abd is benign Ext are without edema    Plans and recommendation   See above A/P which I have amended IF patient feels OK with walking, ok to d/c home   Dietrich Pates MD

## 2023-07-31 NOTE — Discharge Summary (Signed)
 Physician Discharge Summary  [name] [name] QIO:[id number] DOB: June 10, 1960 DOA: 07/29/2023  PCP: Shelva Majestic, MD  Admit date: 07/29/2023 Discharge date: 07/31/2023  Admitted From: Home Disposition: Home Recommendations for Outpatient Follow-up:  Follow up with PCP in 1-2 weeks please refer him for a sleep study Please obtain BMP/CBC in one week Please follow up with cardiology for DCCV as an outpatient  Home Health:none Equipment/Devices: None Discharge Condition: Stable CODE STATUS: Full code Diet recommendation: Cardiac Brief/Interim Summary: 63 year old male with history of hypertension depression psoriasis recently started on Cosentyx injections shots, admitted with chest tightness found to be in new onset A-fib RVR. Initially treated with diltiazem with improvement somewhat then started on amiodarone. CT chest unremarkable  Discharge Diagnoses:  Principal Problem:   Atrial fibrillation with rapid ventricular response (HCC)  #1 new onset A-fib with RVR admitted with complaints of chest tightness and nausea.  He was initially started on amiodarone drip and Eliquis.  Cardiology was consulted.  converted to sinus rhythm Amio drip was stopped started on metoprolol twice daily.  Prior to admission he was on Norvasc and olmesartan which was stopped.   Echocardiogram -EF 65 to 70% mild LVH TSH 0.663 CT angiogram no acute findings noted, age advanced coronary artery atherosclerosis small pericardial effusion and mild cardiomegaly, EKG in A-fib. LDL 70 TG 66 was started on crestor 5 mg  #2 hypertension stopped Norvasc and olmesartan on discharge.  He was started on metoprolol 25 twice daily. #3 depression continue Wellbutrin   Estimated body mass index is 19.4 kg/m as calculated from the following:   Height as of this encounter: 5\' 10"  (1.778 m).   Weight as of this encounter: 61.3 kg.  Discharge Instructions  Discharge Instructions     Diet - low sodium heart healthy    Complete by: As directed    Increase activity slowly   Complete by: As directed       Allergies as of 07/31/2023       Reactions   Benadryl [diphenhydramine Hcl] Itching, Other (See Comments)   Patient stated allergy after ordered by MD   Morphine And Codeine Itching        Medication List     STOP taking these medications    amLODipine 10 MG tablet Commonly known as: NORVASC   calcipotriene 0.005 % ointment Commonly known as: DOVONOX   clobetasol 0.05 % external solution Commonly known as: TEMOVATE   clobetasol cream 0.05 % Commonly known as: TEMOVATE   doxycycline 100 MG tablet Commonly known as: VIBRA-TABS   Drysol 20 % external solution Generic drug: aluminum chloride   hydrocortisone 1 % ointment   ibuprofen 200 MG tablet Commonly known as: ADVIL   nystatin powder Commonly known as: MYCOSTATIN/NYSTOP   olmesartan 40 MG tablet Commonly known as: BENICAR   Otezla 4 x 10 & 51 x20 MG Tbpk Generic drug: Apremilast   predniSONE 20 MG tablet Commonly known as: DELTASONE   triamcinolone cream 0.1 % Commonly known as: KENALOG   triamcinolone ointment 0.1 % Commonly known as: KENALOG       TAKE these medications    apixaban 5 MG Tabs tablet Commonly known as: ELIQUIS Take 1 tablet (5 mg total) by mouth 2 (two) times daily.   buPROPion 150 MG 24 hr tablet Commonly known as: WELLBUTRIN XL TAKE 1 TABLET(150 MG) BY MOUTH DAILY What changed:  how much to take how to take this when to take this additional instructions   Cosentyx UnoReady 300  MG/2ML Soaj Generic drug: Secukinumab Inject 300 mg into the skin every 30 (thirty) days. Starting at week 4, inject one pen monthly What changed: Another medication with the same name was removed. Continue taking this medication, and follow the directions you see here.   metoprolol tartrate 25 MG tablet Commonly known as: LOPRESSOR Take 1 tablet (25 mg total) by mouth 2 (two) times daily.    rosuvastatin 5 MG tablet Commonly known as: CRESTOR Take 1 tablet (5 mg total) by mouth daily.        Follow-up Information     Shelva Majestic, MD Follow up.   Specialty: Family Medicine Contact information: [address] [place] [address] [phone]         Pricilla Riffle, MD Follow up.   Specialty: Cardiology Contact information: [address] [place] [address] [phone]                Allergies  Allergen Reactions   Benadryl [Diphenhydramine Hcl] Itching and Other (See Comments)    Patient stated allergy after ordered by MD   Morphine And Codeine Itching    Consultations:cardiology   Procedures/Studies: ECHOCARDIOGRAM COMPLETE Result Date: 07/30/2023    ECHOCARDIOGRAM REPORT   Patient Name:   [name] [name] Date of Exam: 07/30/2023 Medical Rec #:  [id number]      Height:       70.0 in Accession #:    [id number]     Weight:       135.2 lb Date of Birth:  1960-06-02      BSA:          1.767 m Patient Age:    62 years       BP:           101/75 mmHg Patient Gender: M              HR:           93 bpm. Exam Location:  Inpatient Procedure: 2D Echo, Cardiac Doppler and Color Doppler (Both Spectral and Color            Flow Doppler were utilized during procedure). Indications:    Atrial Fibrillation  History:        Patient has no prior history of Echocardiogram examinations.                 Arrythmias:Atrial Fibrillation; Risk Factors:Hypertension and                 Former Smoker.  Sonographer:    Karma Ganja Referring Phys: [id number] MIR M [facility] IMPRESSIONS  1. Left ventricular ejection fraction, by estimation, is 65 to 70%. The left ventricle has normal function. The left ventricle has no regional wall motion abnormalities. There is mild concentric left ventricular hypertrophy. Left ventricular diastolic parameters are indeterminate. Elevated left ventricular end-diastolic pressure.  2. Right ventricular systolic function is  normal. The right ventricular size is normal. There is normal pulmonary artery systolic pressure.  3. The mitral valve is normal in structure. Trivial mitral valve regurgitation. No evidence of mitral stenosis.  4. The aortic valve is normal in structure. Aortic valve regurgitation is not visualized. No aortic stenosis is present.  5. The inferior vena cava is normal in size with greater than 50% respiratory variability, suggesting right atrial pressure of 3 mmHg. FINDINGS  Left Ventricle: Left ventricular ejection fraction, by estimation, is 65 to 70%. The left ventricle has normal function. The left  ventricle has no regional wall motion abnormalities. The left ventricular internal cavity size was normal in size. There is  mild concentric left ventricular hypertrophy. Left ventricular diastolic function could not be evaluated due to atrial fibrillation. Left ventricular diastolic parameters are indeterminate. Elevated left ventricular end-diastolic pressure. Right Ventricle: The right ventricular size is normal. No increase in right ventricular wall thickness. Right ventricular systolic function is normal. There is normal pulmonary artery systolic pressure. The tricuspid regurgitant velocity is 2.20 m/s, and  with an assumed right atrial pressure of 3 mmHg, the estimated right ventricular systolic pressure is 22.4 mmHg. Left Atrium: Left atrial size was normal in size. Right Atrium: Right atrial size was normal in size. Pericardium: There is no evidence of pericardial effusion. Mitral Valve: The mitral valve is normal in structure. Trivial mitral valve regurgitation. No evidence of mitral valve stenosis. Tricuspid Valve: The tricuspid valve is normal in structure. Tricuspid valve regurgitation is trivial. No evidence of tricuspid stenosis. Aortic Valve: The aortic valve is normal in structure. Aortic valve regurgitation is not visualized. No aortic stenosis is present. Aortic valve mean gradient measures 4.0 mmHg.  Aortic valve peak gradient measures 7.4 mmHg. Aortic valve area, by VTI measures 2.64 cm. Pulmonic Valve: The pulmonic valve was normal in structure. Pulmonic valve regurgitation is not visualized. No evidence of pulmonic stenosis. Aorta: The aortic root is normal in size and structure. Venous: The inferior vena cava is normal in size with greater than 50% respiratory variability, suggesting right atrial pressure of 3 mmHg. IAS/Shunts: No atrial level shunt detected by color flow Doppler.  LEFT VENTRICLE PLAX 2D LVIDd:         4.50 cm   Diastology LVIDs:         2.80 cm   LV e' medial:    5.77 cm/s LV PW:         1.30 cm   LV E/e' medial:  18.0 LV IVS:        1.20 cm   LV e' lateral:   7.40 cm/s LVOT diam:     2.20 cm   LV E/e' lateral: 14.1 LV SV:         55 LV SV Index:   31 LVOT Area:     3.80 cm  RIGHT VENTRICLE             IVC RV Basal diam:  5.10 cm     IVC diam: 1.50 cm RV S prime:     15.00 cm/s TAPSE (M-mode): 3.0 cm LEFT ATRIUM              Index        RIGHT ATRIUM           Index LA diam:        5.20 cm  2.94 cm/m   RA Area:     42.10 cm LA Vol (A2C):   98.3 ml  55.62 ml/m  RA Volume:   177.00 ml 100.15 ml/m LA Vol (A4C):   108.0 ml 61.11 ml/m LA Biplane Vol: 103.0 ml 58.28 ml/m  AORTIC VALVE AV Area (Vmax):    2.45 cm AV Area (Vmean):   2.38 cm AV Area (VTI):     2.64 cm AV Vmax:           136.00 cm/s AV Vmean:          95.000 cm/s AV VTI:            0.207 m AV Peak Grad:  7.4 mmHg AV Mean Grad:      4.0 mmHg LVOT Vmax:         87.80 cm/s LVOT Vmean:        59.600 cm/s LVOT VTI:          0.144 m LVOT/AV VTI ratio: 0.70  AORTA Ao Root diam: 3.70 cm Ao Asc diam:  3.40 cm MITRAL VALVE                TRICUSPID VALVE MV Area (PHT): 3.84 cm     TR Peak grad:   19.4 mmHg MV Decel Time: 197 msec     TR Vmax:        220.00 cm/s MV E velocity: 104.00 cm/s                             SHUNTS                             Systemic VTI:  0.14 m                             Systemic Diam: 2.20 cm Chilton Si MD Electronically signed by Chilton Si MD Signature Date/Time: 07/30/2023/2:00:39 PM    Final    CT Angio Chest Aorta W and/or Wo Contrast Result Date: 07/29/2023 CLINICAL DATA:  Acute aortic syndrome (AAS) suspected Central chest pain since this morning. EXAM: CT ANGIOGRAPHY CHEST WITH CONTRAST TECHNIQUE: Multidetector CT imaging of the chest was performed using the standard protocol during bolus administration of intravenous contrast. Pre contrast imaging was performed. Multiplanar CT image reconstructions and MIPs were obtained to evaluate the vascular anatomy. RADIATION DOSE REDUCTION: This exam was performed according to the departmental dose-optimization program which includes automated exposure control, adjustment of the mA and/or kV according to patient size and/or use of iterative reconstruction technique. CONTRAST:  75mL OMNIPAQUE IOHEXOL 350 MG/ML SOLN COMPARISON:  Chest radiographs 07/29/2023 and 09/11/2012. Abdominal CT 08/03/2020. FINDINGS: Cardiovascular: Pre contrast images demonstrate age advanced coronary artery atherosclerosis. Relatively mild aortic and great vessel atherosclerosis. No evidence of aortic intramural hematoma, aneurysm or dissection. No evidence for acute pulmonary embolism. The right brachiocephalic and right common carotid arteries share a common origin from the aortic arch. Vestigial left vertebral artery arises directly from the aortic arch. The heart is mildly enlarged. There is a small amount of pericardial fluid. Mediastinum/Nodes: There are no enlarged mediastinal, hilar or axillary lymph nodes. The thyroid gland, trachea and esophagus demonstrate no significant findings. Lungs/Pleura: No pleural effusion or pneumothorax. Mild dependent atelectasis at both lung bases. No confluent airspace disease or suspicious pulmonary nodularity. Upper abdomen: No acute findings are seen in the visualized upper abdomen. Musculoskeletal/Chest wall: There is no chest wall  mass or suspicious osseous finding. Mild spondylosis with prominent paraspinal osteophytes. Review of the MIP images confirms the above findings. IMPRESSION: 1. No evidence of acute aortic syndrome or other acute chest findings. 2. Age advanced coronary artery atherosclerosis. 3. Mild cardiomegaly and small pericardial effusion. 4.  Aortic Atherosclerosis (ICD10-I70.0). Electronically Signed   By: Carey Bullocks M.D.   On: 07/29/2023 16:49   DG Chest Portable 1 View Result Date: 07/29/2023 CLINICAL DATA:  Chest pain. EXAM: PORTABLE CHEST 1 VIEW COMPARISON:  Sep 11, 2012. FINDINGS: Mild cardiomegaly is noted. Both lungs are clear. The visualized skeletal structures are unremarkable. IMPRESSION:  No active disease. Electronically Signed   By: Lupita Raider M.D.   On: 07/29/2023 14:59   (Echo, Carotid, EGD, Colonoscopy, ERCP)    Subjective:  He is anxious to go home has been ambulating in the room denies chest pain palpitations shortness of breath headaches.  Where that he needs to follow-up with his primary physician for a sleep study. Discharge Exam: Vitals:   07/31/23 0946 07/31/23 1310  BP: 105/79 117/71  Pulse: 90 80  Resp: 20 (!) 22  Temp: 99.1 F (37.3 C) 98.3 F (36.8 C)  SpO2: 95% 95%   Vitals:   07/30/23 2011 07/31/23 0533 07/31/23 0946 07/31/23 1310  BP: 131/86 113/82 105/79 117/71  Pulse: (!) 101 81 90 80  Resp: 15 16 20  (!) 22  Temp: 98.3 F (36.8 C) 98.3 F (36.8 C) 99.1 F (37.3 C) 98.3 F (36.8 C)  TempSrc:   Oral Oral  SpO2: 96% 95% 95% 95%  Weight:      Height:        General: Pt is alert, awake, not in acute distress Cardiovascular: RRR, S1/S2 +, no rubs, no gallops Respiratory: CTA bilaterally, no wheezing, no rhonchi Abdominal: Soft, NT, ND, bowel sounds + Extremities: no edema, no cyanosis    The results of significant diagnostics from this hospitalization (including imaging, microbiology, ancillary and laboratory) are listed below for reference.      Microbiology: No results found for this or any previous visit (from the past 240 hours).   Labs: BNP (last 3 results) Recent Labs    07/29/23 1420  BNP 61.2   Basic Metabolic Panel: Recent Labs  Lab 07/29/23 1210 07/29/23 1242 07/29/23 2038  NA 134* 135  --   K 4.1 4.3  --   CL 103 105  --   CO2 19*  --   --   GLUCOSE 120* 113*  --   BUN 16 16  --   CREATININE 1.03 0.90  --   CALCIUM 8.8*  --   --   MG  --   --  1.8   Liver Function Tests: No results for input(s): "AST", "ALT", "ALKPHOS", "BILITOT", "PROT", "ALBUMIN" in the last 168 hours. No results for input(s): "LIPASE", "AMYLASE" in the last 168 hours. No results for input(s): "AMMONIA" in the last 168 hours. CBC: Recent Labs  Lab 07/29/23 1210 07/29/23 1242  WBC 12.9*  --   HGB 17.8* 17.7*  HCT 51.4 52.0  MCV 101.4*  --   PLT 241  --    Cardiac Enzymes: No results for input(s): "CKTOTAL", "CKMB", "CKMBINDEX", "TROPONINI" in the last 168 hours. BNP: Invalid input(s): "POCBNP" CBG: Recent Labs  Lab 07/29/23 1246  GLUCAP 108*   D-Dimer No results for input(s): "DDIMER" in the last 72 hours. Hgb A1c No results for input(s): "HGBA1C" in the last 72 hours. Lipid Profile Recent Labs    07/31/23 0531  CHOL 150  HDL 67  LDLCALC 70  TRIG 66  CHOLHDL 2.2   Thyroid function studies Recent Labs    07/29/23 2038  TSH 0.663   Anemia work up No results for input(s): "VITAMINB12", "FOLATE", "FERRITIN", "TIBC", "IRON", "RETICCTPCT" in the last 72 hours. Urinalysis    Component Value Date/Time   COLORURINE AMBER (A) 08/03/2020 2230   APPEARANCEUR CLEAR 08/03/2020 2230   LABSPEC 1.031 (H) 08/03/2020 2230   PHURINE 5.0 08/03/2020 2230   GLUCOSEU NEGATIVE 08/03/2020 2230   HGBUR NEGATIVE 08/03/2020 2230   BILIRUBINUR positive 03/16/2021 1514  KETONESUR 5 (A) 08/03/2020 2230   PROTEINUR Positive (A) 03/16/2021 1514   PROTEINUR 100 (A) 08/03/2020 2230   UROBILINOGEN 1.0 03/16/2021 1514    UROBILINOGEN 0.2 09/06/2008 0840   NITRITE neg 03/16/2021 1514   NITRITE NEGATIVE 08/03/2020 2230   LEUKOCYTESUR Negative 03/16/2021 1514   LEUKOCYTESUR NEGATIVE 08/03/2020 2230   Sepsis Labs Recent Labs  Lab 07/29/23 1210  WBC 12.9*   Microbiology No results found for this or any previous visit (from the past 240 hours).  Time coordinating discharge: 39 min SIGNED:   Alwyn Ren, MD  [facility] 07/31/2023, 3:48 PM

## 2023-08-21 ENCOUNTER — Ambulatory Visit (HOSPITAL_COMMUNITY): Admitting: Physician Assistant

## 2023-08-28 ENCOUNTER — Telehealth: Payer: Self-pay | Admitting: Dermatology

## 2023-09-12 ENCOUNTER — Observation Stay (HOSPITAL_COMMUNITY)
Admission: EM | Admit: 2023-09-12 | Discharge: 2023-09-16 | Disposition: A | Discharge status: Home / Self Care | Attending: Internal Medicine | Admitting: Internal Medicine

## 2023-09-12 ENCOUNTER — Encounter (HOSPITAL_COMMUNITY): Payer: Self-pay | Admitting: Emergency Medicine

## 2023-09-12 ENCOUNTER — Emergency Department (HOSPITAL_COMMUNITY)

## 2023-09-12 ENCOUNTER — Other Ambulatory Visit: Payer: Self-pay

## 2023-09-12 DIAGNOSIS — N323 Diverticulum of bladder: Secondary | ICD-10-CM | POA: Diagnosis not present

## 2023-09-12 DIAGNOSIS — R072 Precordial pain: Secondary | ICD-10-CM | POA: Diagnosis present

## 2023-09-12 DIAGNOSIS — Z87891 Personal history of nicotine dependence: Secondary | ICD-10-CM | POA: Diagnosis not present

## 2023-09-12 DIAGNOSIS — R110 Nausea: Secondary | ICD-10-CM | POA: Diagnosis not present

## 2023-09-12 DIAGNOSIS — R0602 Shortness of breath: Secondary | ICD-10-CM | POA: Insufficient documentation

## 2023-09-12 DIAGNOSIS — Z79899 Other long term (current) drug therapy: Secondary | ICD-10-CM | POA: Diagnosis not present

## 2023-09-12 DIAGNOSIS — R0902 Hypoxemia: Secondary | ICD-10-CM | POA: Diagnosis not present

## 2023-09-12 DIAGNOSIS — J811 Chronic pulmonary edema: Secondary | ICD-10-CM | POA: Diagnosis not present

## 2023-09-12 DIAGNOSIS — I4891 Unspecified atrial fibrillation: Secondary | ICD-10-CM | POA: Insufficient documentation

## 2023-09-12 DIAGNOSIS — Z1152 Encounter for screening for COVID-19: Secondary | ICD-10-CM | POA: Insufficient documentation

## 2023-09-12 DIAGNOSIS — K5792 Diverticulitis of intestine, part unspecified, without perforation or abscess without bleeding: Secondary | ICD-10-CM | POA: Diagnosis not present

## 2023-09-12 DIAGNOSIS — I517 Cardiomegaly: Secondary | ICD-10-CM | POA: Diagnosis not present

## 2023-09-12 DIAGNOSIS — Z7901 Long term (current) use of anticoagulants: Secondary | ICD-10-CM | POA: Insufficient documentation

## 2023-09-12 DIAGNOSIS — Z6841 Body Mass Index (BMI) 40.0 and over, adult: Secondary | ICD-10-CM | POA: Diagnosis not present

## 2023-09-12 DIAGNOSIS — D751 Secondary polycythemia: Secondary | ICD-10-CM | POA: Diagnosis not present

## 2023-09-12 DIAGNOSIS — Z9049 Acquired absence of other specified parts of digestive tract: Secondary | ICD-10-CM | POA: Diagnosis not present

## 2023-09-12 DIAGNOSIS — I10 Essential (primary) hypertension: Secondary | ICD-10-CM | POA: Diagnosis not present

## 2023-09-12 DIAGNOSIS — F32A Depression, unspecified: Secondary | ICD-10-CM | POA: Diagnosis not present

## 2023-09-12 DIAGNOSIS — R079 Chest pain, unspecified: Secondary | ICD-10-CM

## 2023-09-12 DIAGNOSIS — K5730 Diverticulosis of large intestine without perforation or abscess without bleeding: Secondary | ICD-10-CM | POA: Diagnosis not present

## 2023-09-12 LAB — BLOOD GAS, ARTERIAL
Acid-base deficit: 0.8 mmol/L (ref 0.0–2.0)
Bicarbonate: 23.5 mmol/L (ref 20.0–28.0)
Drawn by: 72234
FIO2: 21 %
O2 Saturation: 95.4 %
Patient temperature: 37
pCO2 arterial: 37 mmHg (ref 32–48)
pH, Arterial: 7.41 (ref 7.35–7.45)
pO2, Arterial: 65 mmHg — ABNORMAL LOW (ref 83–108)

## 2023-09-12 LAB — TROPONIN I (HIGH SENSITIVITY)
Troponin I (High Sensitivity): 8 ng/L (ref <18)
Troponin I (High Sensitivity): 8 ng/L (ref <18)

## 2023-09-12 LAB — CBC
HCT: 51.9 % (ref 39.0–52.0)
Hemoglobin: 17.9 g/dL — ABNORMAL HIGH (ref 13.0–17.0)
MCH: 34.8 pg — ABNORMAL HIGH (ref 26.0–34.0)
MCHC: 34.5 g/dL (ref 30.0–36.0)
MCV: 100.8 fL — ABNORMAL HIGH (ref 80.0–100.0)
Platelets: 219 10*3/uL (ref 150–400)
RBC: 5.15 MIL/uL (ref 4.22–5.81)
RDW: 13.3 % (ref 11.5–15.5)
WBC: 9.1 10*3/uL (ref 4.0–10.5)
nRBC: 0 % (ref 0.0–0.2)

## 2023-09-12 LAB — BRAIN NATRIURETIC PEPTIDE: B Natriuretic Peptide: 67.6 pg/mL (ref 0.0–100.0)

## 2023-09-12 LAB — RESP PANEL BY RT-PCR (RSV, FLU A&B, COVID)  RVPGX2
Influenza A by PCR: NEGATIVE
Influenza B by PCR: NEGATIVE
Resp Syncytial Virus by PCR: NEGATIVE
SARS Coronavirus 2 by RT PCR: NEGATIVE

## 2023-09-12 LAB — COMPREHENSIVE METABOLIC PANEL WITH GFR
ALT: 21 U/L (ref 0–44)
AST: 22 U/L (ref 15–41)
Albumin: 4 g/dL (ref 3.5–5.0)
Alkaline Phosphatase: 52 U/L (ref 38–126)
Anion gap: 9 (ref 5–15)
BUN: 12 mg/dL (ref 8–23)
CO2: 22 mmol/L (ref 22–32)
Calcium: 8.8 mg/dL — ABNORMAL LOW (ref 8.9–10.3)
Chloride: 106 mmol/L (ref 98–111)
Creatinine, Ser: 0.79 mg/dL (ref 0.61–1.24)
GFR, Estimated: >60 mL/min (ref >60)
Glucose, Bld: 119 mg/dL — ABNORMAL HIGH (ref 70–99)
Potassium: 4.2 mmol/L (ref 3.5–5.1)
Sodium: 137 mmol/L (ref 135–145)
Total Bilirubin: 1.1 mg/dL (ref 0.0–1.2)
Total Protein: 7.7 g/dL (ref 6.5–8.1)

## 2023-09-12 LAB — D-DIMER, QUANTITATIVE: D-Dimer, Quant: 0.55 ug{FEU}/mL — ABNORMAL HIGH (ref 0.00–0.50)

## 2023-09-12 LAB — LIPASE, BLOOD: Lipase: 35 U/L (ref 11–51)

## 2023-09-12 MED ORDER — SENNOSIDES-DOCUSATE SODIUM 8.6-50 MG PO TABS
2.0000 | ORAL_TABLET | Freq: Two times a day (BID) | ORAL | Status: DC
  Administered 2023-09-12 – 2023-09-16 (×8): 2 via ORAL
  Filled 2023-09-12 (×9): qty 2

## 2023-09-12 MED ORDER — POLYETHYLENE GLYCOL 3350 17 G PO PACK: 17.0000 g | PACK | Freq: Every day | ORAL | Status: DC | PRN

## 2023-09-12 MED ORDER — ASPIRIN 81 MG PO CHEW
324.0000 mg | CHEWABLE_TABLET | Freq: Once | ORAL | Status: AC
  Administered 2023-09-12: 324 mg via ORAL
  Filled 2023-09-12: qty 4

## 2023-09-12 MED ORDER — METOPROLOL TARTRATE 25 MG PO TABS
25.0000 mg | ORAL_TABLET | Freq: Two times a day (BID) | ORAL | Status: DC
  Administered 2023-09-12 – 2023-09-15 (×6): 25 mg via ORAL
  Filled 2023-09-12 (×6): qty 1

## 2023-09-12 MED ORDER — ENOXAPARIN SODIUM 40 MG/0.4ML IJ SOSY
40.0000 mg | PREFILLED_SYRINGE | INTRAMUSCULAR | Status: DC
Start: 2023-09-13 — End: 2023-09-12

## 2023-09-12 MED ORDER — IOHEXOL 350 MG/ML SOLN
100.0000 mL | Freq: Once | INTRAVENOUS | Status: AC | PRN
  Administered 2023-09-12: 100 mL via INTRAVENOUS

## 2023-09-12 MED ORDER — SODIUM CHLORIDE 0.9% FLUSH
3.0000 mL | Freq: Two times a day (BID) | INTRAVENOUS | Status: DC
  Administered 2023-09-12 – 2023-09-15 (×8): 3 mL via INTRAVENOUS

## 2023-09-12 MED ORDER — FENTANYL CITRATE PF 50 MCG/ML IJ SOSY
50.0000 ug | PREFILLED_SYRINGE | Freq: Once | INTRAMUSCULAR | Status: AC
  Administered 2023-09-12: 50 ug via INTRAVENOUS
  Filled 2023-09-12: qty 1

## 2023-09-12 MED ORDER — APIXABAN 5 MG PO TABS
5.0000 mg | ORAL_TABLET | Freq: Two times a day (BID) | ORAL | Status: DC
  Administered 2023-09-12 – 2023-09-16 (×8): 5 mg via ORAL
  Filled 2023-09-12 (×8): qty 1

## 2023-09-12 MED ORDER — METOPROLOL TARTRATE 5 MG/5ML IV SOLN
5.0000 mg | Freq: Once | INTRAVENOUS | Status: AC
  Administered 2023-09-12: 5 mg via INTRAVENOUS
  Filled 2023-09-12: qty 5

## 2023-09-12 MED ORDER — BUPROPION HCL ER (XL) 150 MG PO TB24
150.0000 mg | ORAL_TABLET | Freq: Every day | ORAL | Status: DC
  Administered 2023-09-12 – 2023-09-15 (×4): 150 mg via ORAL
  Filled 2023-09-12 (×5): qty 1

## 2023-09-12 MED ORDER — ACETAMINOPHEN 325 MG PO TABS: 650.0000 mg | ORAL_TABLET | Freq: Four times a day (QID) | ORAL | Status: DC | PRN

## 2023-09-12 MED ORDER — ACETAMINOPHEN 650 MG RE SUPP: 650.0000 mg | Freq: Four times a day (QID) | RECTAL | Status: DC | PRN

## 2023-09-12 MED ORDER — HYDROCODONE-ACETAMINOPHEN 5-325 MG PO TABS
1.0000 | ORAL_TABLET | ORAL | Status: DC | PRN
  Administered 2023-09-12: 2 via ORAL
  Filled 2023-09-12: qty 2

## 2023-09-12 MED ORDER — CELECOXIB 200 MG PO CAPS
200.0000 mg | ORAL_CAPSULE | Freq: Two times a day (BID) | ORAL | Status: DC
  Administered 2023-09-12 – 2023-09-14 (×5): 200 mg via ORAL
  Filled 2023-09-12 (×6): qty 1

## 2023-09-12 MED ORDER — ROSUVASTATIN CALCIUM 5 MG PO TABS
5.0000 mg | ORAL_TABLET | Freq: Every day | ORAL | Status: DC
  Administered 2023-09-12 – 2023-09-16 (×5): 5 mg via ORAL
  Filled 2023-09-12 (×5): qty 1

## 2023-09-12 NOTE — Assessment & Plan Note (Signed)
 This was last diagnosed on March 2025.  At this time patient's heart rate is above long-term target.  Likely because patient is not taking his metoprolol  due to some miscommunication.  Restart metoprolol  25 twice daily.  I discussed with the patient.  Continue with chronic apixaban .

## 2023-09-12 NOTE — ED Notes (Signed)
 Pt sats noted 85% room air, placed on 2 liters O2 nasal cannula.

## 2023-09-12 NOTE — ED Provider Notes (Signed)
 [facility] AT [facility] Provider Note   CSN: [id number] Arrival date & time: 09/12/23  4098     History  Chief Complaint  Patient presents with   Chest Pain    [name] [name] is a 63 y.o. male.   Chest Pain    Patient has a history of hypertension diverticulitis morbid obesity, psoriasis, depression, atrial fibrillation.  Patient was recently admitted to the hospital on March 18.  At that time the patient presented to the ED with complaints of chest tightness.  Patient was treated with amiodarone  and started on Eliquis .  Patient states he has been compliant with his medications.  He was supposed to follow-up with cardiology but has not seen them yet.  Patient states he was doing fine since his hospitalization until this morning when he started having sharp chest pain.  Hurts to breathe send it feels like he is being stabbed in the front of his chest.  Home Medications Prior to Admission medications   Medication Sig Start Date End Date Taking? Authorizing Provider  apixaban  (ELIQUIS ) 5 MG TABS tablet Take 1 tablet (5 mg total) by mouth 2 (two) times daily. 07/31/23   Barbee Lew, MD  buPROPion  (WELLBUTRIN  XL) 150 MG 24 hr tablet TAKE 1 TABLET(150 MG) BY MOUTH DAILY Patient taking differently: Take 150 mg by mouth at bedtime. 06/11/23   Almira Jaeger, MD  metoprolol  tartrate (LOPRESSOR ) 25 MG tablet Take 1 tablet (25 mg total) by mouth 2 (two) times daily. 07/31/23   Barbee Lew, MD  rosuvastatin  (CRESTOR ) 5 MG tablet Take 1 tablet (5 mg total) by mouth daily. 07/31/23   Barbee Lew, MD  Secukinumab  (COSENTYX  UNOREADY) 300 MG/2ML SOAJ Inject 300 mg into the skin every 30 (thirty) days. Starting at week 4, inject one pen monthly 05/28/23   Paci, Karina M, MD      Allergies    Benadryl  [diphenhydramine  hcl] and Morphine and codeine    Review of Systems   Review of Systems  Cardiovascular:  Positive for chest pain.     Physical Exam Updated Vital Signs BP (!) 180/103 (BP Location: Right Arm)   Pulse (!) 110   Temp 99.8 F (37.7 C) (Oral)   Resp 16   SpO2 95%  Physical Exam Vitals and nursing note reviewed.  Constitutional:      General: He is not in acute distress.    Comments: Increased BMI  HENT:     Head: Normocephalic and atraumatic.     Right Ear: External ear normal.     Left Ear: External ear normal.  Eyes:     General: No scleral icterus.       Right eye: No discharge.        Left eye: No discharge.     Conjunctiva/sclera: Conjunctivae normal.  Neck:     Trachea: No tracheal deviation.  Cardiovascular:     Rate and Rhythm: Normal rate and regular rhythm.  Pulmonary:     Effort: Pulmonary effort is normal. No respiratory distress.     Breath sounds: Normal breath sounds. No stridor. No decreased breath sounds, wheezing or rales.  Abdominal:     General: Bowel sounds are normal. There is no distension.     Palpations: Abdomen is soft.     Tenderness: There is no abdominal tenderness. There is no guarding or rebound.  Musculoskeletal:        General: No tenderness or deformity.  Cervical back: Neck supple.     Right lower leg: Edema present.     Left lower leg: Edema present.     Comments: Mild edema bilateral lower extremities  Skin:    General: Skin is warm and dry.     Findings: No rash.  Neurological:     General: No focal deficit present.     Mental Status: He is alert.     Cranial Nerves: No cranial nerve deficit, dysarthria or facial asymmetry.     Sensory: No sensory deficit.     Motor: No abnormal muscle tone or seizure activity.     Coordination: Coordination normal.  Psychiatric:        Mood and Affect: Mood normal.    ED Results / Procedures / Treatments   Labs (all labs ordered are listed, but only abnormal results are displayed) Labs Reviewed  CBC - Abnormal; Notable for the following components:      Result Value   Hemoglobin 17.9 (*)    MCV  100.8 (*)    MCH 34.8 (*)    All other components within normal limits  COMPREHENSIVE METABOLIC PANEL WITH GFR - Abnormal; Notable for the following components:   Glucose, Bld 119 (*)    Calcium  8.8 (*)    All other components within normal limits  RESP PANEL BY RT-PCR (RSV, FLU A&B, COVID)  RVPGX2  BRAIN NATRIURETIC PEPTIDE  LIPASE, BLOOD  URINALYSIS, ROUTINE W REFLEX MICROSCOPIC  TROPONIN I (HIGH SENSITIVITY)  TROPONIN I (HIGH SENSITIVITY)    EKG EKG Interpretation Date/Time:  Friday Sep 12 2023 10:03:35 EDT Ventricular Rate:  124 PR Interval:    QRS Duration:  131 QT Interval:  304 QTC Calculation: 415 R Axis:   73  Text Interpretation: Atrial fibrillation Right bundle branch block Nonspecific T abnormalities, lateral leads Minimal ST elevation, inferior leads Baseline wander in lead(s) II III aVF V4 V6 Since last tracing rate faster Confirmed by Trish Furl [phone]) on 09/12/2023 10:13:34 AM  Radiology CT Angio Chest/Abd/Pel for Dissection W and/or Wo Contrast Result Date: 09/12/2023 CLINICAL DATA:  Acute aortic syndrome (AAS) suspected, chest pain and nausea, atrial fibrillation EXAM: CT ANGIOGRAPHY CHEST, ABDOMEN AND PELVIS TECHNIQUE: Non-contrast CT of the chest was initially obtained. Multidetector CT imaging through the chest, abdomen and pelvis was performed using the standard protocol during bolus administration of intravenous contrast. Multiplanar reconstructed images and MIPs were obtained and reviewed to evaluate the vascular anatomy. RADIATION DOSE REDUCTION: This exam was performed according to the departmental dose-optimization program which includes automated exposure control, adjustment of the mA and/or kV according to patient size and/or use of iterative reconstruction technique. CONTRAST:  OMNIPAQUE  IOHEXOL  350 MG/ML SOLN COMPARISON:  July 29, 2023, August 03, 2020 FINDINGS: CTA CHEST FINDINGS Pulmonary Embolism: No pulmonary embolism. Cardiovascular: Moderate  cardiomegaly. Dense multi-vessel coronary atherosclerosis. Small amount of pericardial effusion with apparent thickening of the pericardium and induration of the cardiophrenic fat. No aortic aneurysm, intramural hematoma, or aortic dissection. Variant 3 vessel aortic arch anatomy with the left common carotid arising from the brachiocephalic artery. Diminutive left vertebral artery arises directly from the aortic arch. Scattered calcified atherosclerosis throughout the thoracic aorta. Mediastinum/Nodes: No mediastinal mass. No mediastinal, hilar, or axillary lymphadenopathy. Lungs/Pleura: The midline trachea and bronchi are patent. No focal airspace consolidation, pleural effusion, or pneumothorax. Posterior bibasilar dependent atelectasis. Fibrolinear scarring in the lingula and left lower lobe. Musculoskeletal: No acute fracture or destructive bone lesion. Thoracic DISH. Review of the MIP images  confirms the above findings. CTA ABDOMEN AND PELVIS FINDINGS VASCULAR Aorta: No aortic aneurysm, intramural hematoma, or dissection. Scattered calcified atherosclerosis throughout the aorta. No hemodynamically significant stenosis. Celiac: Patent without acute thrombus, aneurysm, or dissection. No hemodynamically significant stenosis. SMA: Patent without acute thrombus, aneurysm, or dissection. No hemodynamically significant stenosis. Renals: 2 right renal arteries with a single left renal artery noted. Patent without acute thrombus, aneurysm, or dissection. No hemodynamically significant stenosis. IMA: Patent without acute thrombus, aneurysm, or dissection. No hemodynamically significant stenosis. Inflow: Patent without acute thrombus, aneurysm, or dissection. No hemodynamically significant stenosis. Proximal Outflow: The bilateral common femoral and visualized portions of the superficial and profunda femoral arteries are patent without acute thrombus, aneurysm, or dissection. No hemodynamically significant stenosis.  Veins: Not well evaluated due to the arterial phase of contrast. Review of the MIP images confirms the above findings. NON-VASCULAR Hepatobiliary: No mass.No radiopaque stones or wall thickening of the gallbladder.No intrahepatic or extrahepatic biliary ductal dilation. Pancreas: No mass or main ductal dilation.No peripancreatic inflammation or fluid collection. Spleen: Normal size. No mass. Adrenals/Urinary Tract: No adrenal masses. No renal mass. No hydronephrosis or nephrolithiasis. Completely decompressed with perivesicular stranding and a small left lateral bladder diverticulum. Stomach/Bowel: The stomach is decompressed without focal abnormality. No small bowel wall thickening or inflammation. No small bowel obstruction. Normal appendix. Sigmoid diverticulosis. Lymphatic: No intraabdominal or pelvic lymphadenopathy. Reproductive: No prostatomegaly.no free pelvic fluid. Other: No pneumoperitoneum, ascites, or mesenteric inflammation. Musculoskeletal: No acute fracture or destructive lesion.Multilevel degenerative disc disease of the spine. Review of the MIP images confirms the above findings. IMPRESSION: 1. No aortic aneurysm, intramural hematoma, or aortic dissection. 2. Moderate cardiomegaly with dense multi-vessel coronary atherosclerosis. Small pericardial effusion with thickening of the pericardial sac and apparent induration of the epicardial fat, which may represent changes of pericarditis, in the correct clinical context. 3. Completely decompressed urinary bladder with perivesicular stranding and a small left lateral bladder diverticulum. Correlation with urinalysis recommended to exclude acute cystitis. 4. Sigmoid diverticulosis.  No changes of acute diverticulitis. Aortic Atherosclerosis (ICD10-I70.0). Electronically Signed   By: Rance Burrows M.D.   On: 09/12/2023 13:06   DG Chest Portable 1 View Result Date: 09/12/2023 CLINICAL DATA:  Chest pain and nausea beginning this morning. Atrial  fibrillation. EXAM: PORTABLE CHEST 1 VIEW COMPARISON:  One-view chest x-ray 07/29/2023 FINDINGS: The heart is enlarged. Atherosclerotic calcifications are present at the aortic arch. Mild interstitial edema is present. Small effusions are suspected. IMPRESSION: Cardiomegaly and mild interstitial edema compatible with congestive heart failure. Electronically Signed   By: Audree Leas M.D.   On: 09/12/2023 11:44    Procedures Procedures    Medications Ordered in ED Medications  aspirin  chewable tablet 324 mg (324 mg Oral Given 09/12/23 1039)  fentaNYL  (SUBLIMAZE ) injection 50 mcg (50 mcg Intravenous Given 09/12/23 1043)  metoprolol  tartrate (LOPRESSOR ) injection 5 mg (5 mg Intravenous Given 09/12/23 1045)  fentaNYL  (SUBLIMAZE ) injection 50 mcg (50 mcg Intravenous Given 09/12/23 1239)  iohexol  (OMNIPAQUE ) 350 MG/ML injection 100 mL (100 mLs Intravenous Contrast Given 09/12/23 1204)    ED Course/ Medical Decision Making/ A&P Clinical Course as of 09/12/23 1548  Fri Sep 12, 2023  1144 COVID flu RSV negative.  BNP normal.  Troponin normal.  CBC metabolic panel unremarkable. [JK]  1407 CT angiogram shows no evidence of aneurysm or dissection.  Patient has cardiomegaly and small pericardial effusion with changes of possible pericarditis..  Decompressed urinary bladder also noted [JK]  1529 Case discussed with Dr Cleda Curly [JK]  Clinical Course User Index [JK] Trish Furl, MD                                 Medical Decision Making Problems Addressed: Atrial fibrillation, unspecified type [facility]): chronic illness or injury with exacerbation, progression, or side effects of treatment Hypoxia: acute illness or injury that poses a threat to life or bodily functions  Amount and/or Complexity of Data Reviewed Labs: ordered. Decision-making details documented in ED Course. Radiology: ordered and independent interpretation performed.  Risk OTC drugs. Prescription drug management. Decision regarding  hospitalization.   Patient presented to the ED with complaints of chest pain.  Is concerned about the possibility of ACS pneumothorax pneumonia dissection.  Patient also has history of atrial fibrillation and recently started on amiodarone  and anticoagulation.  Has not followed up with cardiology.  In the ED no evidence of pneumonia on chest x-ray.  No findings suggest infection.  Initial troponin and BNP are normal.  No findings of CHF.  CT angiogram was performed and it does not show any evidence of dissection or large PE.  Patient does have a new oxygen requirement.  No clear etiology for that at this time.  Patient does have increased BMI however proior echo showed normal rv function.  Will admit for further workup        Final Clinical Impression(s) / ED Diagnoses Final diagnoses:  Atrial fibrillation, unspecified type [facility])  Hypoxia    Rx / DC Orders ED Discharge Orders     None         Trish Furl, MD 09/12/23 1719

## 2023-09-12 NOTE — ED Notes (Signed)
 ED TO INPATIENT HANDOFF REPORT  Name/Age/Gender [name] [name] 63 y.o. male  Code Status    Code Status Orders  (From admission, onward)           Start     Ordered   09/12/23 1535  Full code  Continuous       Question:  By:  Answer:  Other   09/12/23 1535           Code Status History     Date Active Date Inactive Code Status Order ID Comments User Context   07/29/2023 1727 07/31/2023 2155 Full Code [id number]  Gaylin Ke, MD ED   09/18/2012 1440 09/20/2012 1444 Full Code [id number]  Fran Imus, MD Inpatient       Home/SNF/Other Home  Chief Complaint Hypoxia [R09.02]  Level of Care/Admitting Diagnosis ED Disposition     ED Disposition  Admit   Condition  --   Comment  Hospital Area: [facility] [[id number]]  Level of Care: Telemetry [5]  Admit to tele based on following criteria: Eval of Syncope  May place patient in observation at [facility] or [facility] if equivalent level of care is available:: No  Covid Evaluation: Asymptomatic - no recent exposure (last 10 days) testing not required  Diagnosis: Hypoxia [300808]  Admitting Physician: Bennie Brave [[id number]]  Attending Physician: Bennie Brave [[id number]]          Medical History Past Medical History:  Diagnosis Date   Allergy    Boil 12/28/2021   Offered to lance, he declined Will use doxy for impetigo and he will use warm compresses.   Diverticulitis    leading to approx 1 foot resection-colectomy   History of kidney stones    Hx of adenomatous colonic polyps 10/19/2017   Hypertension    Impetigo any site 12/28/2021   Intertrigo 12/28/2021   NEPHROLITHIASIS, HX OF 03/09/2007   PONV (postoperative nausea and vomiting)    SPRAIN/STRAIN, ANKLE NOS 01/05/2007   Sweaty armpits 12/28/2021    Allergies Allergies  Allergen Reactions   Benadryl  [Diphenhydramine  Hcl] Itching and Other (See Comments)    Patient stated allergy after ordered by MD   Morphine And Codeine Itching     IV Location/Drains/Wounds Patient Lines/Drains/Airways Status     Active Line/Drains/Airways     Name Placement date Placement time Site Days   Peripheral IV 09/12/23 20 G Right Antecubital 09/12/23  1029  Antecubital  less than 1   Incision - 2 Ports Abdomen 1: Left;Lateral 2: Left;Lateral;Upper 09/18/12  --  -- 3016            Labs/Imaging Results for orders placed or performed during the hospital encounter of 09/12/23 (from the past 48 hours)  Troponin I (High Sensitivity)     Status: None   Collection Time: 09/12/23 10:31 AM  Result Value Ref Range   Troponin I (High Sensitivity) 8 <18 ng/L    Comment: (NOTE) Elevated high sensitivity troponin I (hsTnI) values and significant  changes across serial measurements may suggest ACS but many other  chronic and acute conditions are known to elevate hsTnI results.  Refer to the "Links" section for chest pain algorithms and additional  guidance. Performed at [facility], [address] [place], [place] [address]   CBC     Status: Abnormal   Collection Time: 09/12/23 10:31 AM  Result Value Ref Range   WBC 9.1 4.0 - 10.5 K/uL   RBC 5.15 4.22 -  5.81 MIL/uL   Hemoglobin 17.9 (H) 13.0 - 17.0 g/dL   HCT 78.4 69.6 - 29.5 %   MCV 100.8 (H) 80.0 - 100.0 fL   MCH 34.8 (H) 26.0 - 34.0 pg   MCHC 34.5 30.0 - 36.0 g/dL   RDW 28.4 13.2 - 44.0 %   Platelets 219 150 - 400 K/uL   nRBC 0.0 0.0 - 0.2 %    Comment: Performed at [facility], [address], [place], [place] [address]  Comprehensive metabolic panel     Status: Abnormal   Collection Time: 09/12/23 10:31 AM  Result Value Ref Range   Sodium 137 135 - 145 mmol/L   Potassium 4.2 3.5 - 5.1 mmol/L   Chloride 106 98 - 111 mmol/L   CO2 22 22 - 32 mmol/L   Glucose, Bld 119 (H) 70 - 99 mg/dL    Comment: Glucose reference range applies only to samples taken after fasting for at least 8 hours.   BUN 12 8 - 23 mg/dL   Creatinine, Ser 5.36  0.61 - 1.24 mg/dL   Calcium  8.8 (L) 8.9 - 10.3 mg/dL   Total Protein 7.7 6.5 - 8.1 g/dL   Albumin 4.0 3.5 - 5.0 g/dL   AST 22 15 - 41 U/L   ALT 21 0 - 44 U/L   Alkaline Phosphatase 52 38 - 126 U/L   Total Bilirubin 1.1 0.0 - 1.2 mg/dL   GFR, Estimated >64 >40 mL/min    Comment: (NOTE) Calculated using the CKD-EPI Creatinine Equation (2021)    Anion gap 9 5 - 15    Comment: Performed at [facility], [address], [place], [place] [address]  Brain natriuretic peptide     Status: None   Collection Time: 09/12/23 10:32 AM  Result Value Ref Range   B Natriuretic Peptide 67.6 0.0 - 100.0 pg/mL    Comment: Performed at [facility], [address] [place], [place] [address]  Resp panel by RT-PCR (RSV, Flu A&B, Covid) Anterior Nasal Swab     Status: None   Collection Time: 09/12/23 10:38 AM   Specimen: Anterior Nasal Swab  Result Value Ref Range   SARS Coronavirus 2 by RT PCR NEGATIVE NEGATIVE    Comment: (NOTE) SARS-CoV-2 target nucleic acids are NOT DETECTED.  The SARS-CoV-2 RNA is generally detectable in upper respiratory specimens during the acute phase of infection. The lowest concentration of SARS-CoV-2 viral copies this assay can detect is 138 copies/mL. A negative result does not preclude SARS-Cov-2 infection and should not be used as the sole basis for treatment or other patient management decisions. A negative result may occur with  improper specimen collection/handling, submission of specimen other than nasopharyngeal swab, presence of viral mutation(s) within the areas targeted by this assay, and inadequate number of viral copies(<138 copies/mL). A negative result must be combined with clinical observations, patient history, and epidemiological information. The expected result is Negative.  Fact Sheet for Patients:  BloggerCourse.com  Fact Sheet for Healthcare Providers:   SeriousBroker.it  This test is no t yet approved or cleared by the [place]  FDA and  has been authorized for detection and/or diagnosis of SARS-CoV-2 by FDA under an Emergency Use Authorization (EUA). This EUA will remain  in effect (meaning this test can be used) for the duration of the COVID-19 declaration under Section 564(b)(1) of the Act, 21 U.S.C.section 360bbb-3(b)(1), unless the authorization is terminated  or revoked sooner.  Influenza A by PCR NEGATIVE NEGATIVE   Influenza B by PCR NEGATIVE NEGATIVE    Comment: (NOTE) The Xpert Xpress SARS-CoV-2/FLU/RSV plus assay is intended as an aid in the diagnosis of influenza from Nasopharyngeal swab specimens and should not be used as a sole basis for treatment. Nasal washings and aspirates are unacceptable for Xpert Xpress SARS-CoV-2/FLU/RSV testing.  Fact Sheet for Patients: BloggerCourse.com  Fact Sheet for Healthcare Providers: SeriousBroker.it  This test is not yet approved or cleared by the [place]  FDA and has been authorized for detection and/or diagnosis of SARS-CoV-2 by FDA under an Emergency Use Authorization (EUA). This EUA will remain in effect (meaning this test can be used) for the duration of the COVID-19 declaration under Section 564(b)(1) of the Act, 21 U.S.C. section 360bbb-3(b)(1), unless the authorization is terminated or revoked.     Resp Syncytial Virus by PCR NEGATIVE NEGATIVE    Comment: (NOTE) Fact Sheet for Patients: BloggerCourse.com  Fact Sheet for Healthcare Providers: SeriousBroker.it  This test is not yet approved or cleared by the [place]  FDA and has been authorized for detection and/or diagnosis of SARS-CoV-2 by FDA under an Emergency Use Authorization (EUA). This EUA will remain in effect (meaning this test can be used) for the duration of  the COVID-19 declaration under Section 564(b)(1) of the Act, 21 U.S.C. section 360bbb-3(b)(1), unless the authorization is terminated or revoked.  Performed at [facility], [address], [place], [place] [address]    CT Angio Chest/Abd/Pel for Dissection W and/or Wo Contrast Result Date: 09/12/2023 CLINICAL DATA:  Acute aortic syndrome (AAS) suspected, chest pain and nausea, atrial fibrillation EXAM: CT ANGIOGRAPHY CHEST, ABDOMEN AND PELVIS TECHNIQUE: Non-contrast CT of the chest was initially obtained. Multidetector CT imaging through the chest, abdomen and pelvis was performed using the standard protocol during bolus administration of intravenous contrast. Multiplanar reconstructed images and MIPs were obtained and reviewed to evaluate the vascular anatomy. RADIATION DOSE REDUCTION: This exam was performed according to the departmental dose-optimization program which includes automated exposure control, adjustment of the mA and/or kV according to patient size and/or use of iterative reconstruction technique. CONTRAST:  OMNIPAQUE  IOHEXOL  350 MG/ML SOLN COMPARISON:  July 29, 2023, August 03, 2020 FINDINGS: CTA CHEST FINDINGS Pulmonary Embolism: No pulmonary embolism. Cardiovascular: Moderate cardiomegaly. Dense multi-vessel coronary atherosclerosis. Small amount of pericardial effusion with apparent thickening of the pericardium and induration of the cardiophrenic fat. No aortic aneurysm, intramural hematoma, or aortic dissection. Variant 3 vessel aortic arch anatomy with the left common carotid arising from the brachiocephalic artery. Diminutive left vertebral artery arises directly from the aortic arch. Scattered calcified atherosclerosis throughout the thoracic aorta. Mediastinum/Nodes: No mediastinal mass. No mediastinal, hilar, or axillary lymphadenopathy. Lungs/Pleura: The midline trachea and bronchi are patent. No focal airspace consolidation, pleural effusion, or  pneumothorax. Posterior bibasilar dependent atelectasis. Fibrolinear scarring in the lingula and left lower lobe. Musculoskeletal: No acute fracture or destructive bone lesion. Thoracic DISH. Review of the MIP images confirms the above findings. CTA ABDOMEN AND PELVIS FINDINGS VASCULAR Aorta: No aortic aneurysm, intramural hematoma, or dissection. Scattered calcified atherosclerosis throughout the aorta. No hemodynamically significant stenosis. Celiac: Patent without acute thrombus, aneurysm, or dissection. No hemodynamically significant stenosis. SMA: Patent without acute thrombus, aneurysm, or dissection. No hemodynamically significant stenosis. Renals: 2 right renal arteries with a single left renal artery noted. Patent without acute thrombus, aneurysm, or dissection. No hemodynamically significant stenosis. IMA: Patent without acute thrombus, aneurysm, or dissection. No hemodynamically significant stenosis. Inflow:  Patent without acute thrombus, aneurysm, or dissection. No hemodynamically significant stenosis. Proximal Outflow: The bilateral common femoral and visualized portions of the superficial and profunda femoral arteries are patent without acute thrombus, aneurysm, or dissection. No hemodynamically significant stenosis. Veins: Not well evaluated due to the arterial phase of contrast. Review of the MIP images confirms the above findings. NON-VASCULAR Hepatobiliary: No mass.No radiopaque stones or wall thickening of the gallbladder.No intrahepatic or extrahepatic biliary ductal dilation. Pancreas: No mass or main ductal dilation.No peripancreatic inflammation or fluid collection. Spleen: Normal size. No mass. Adrenals/Urinary Tract: No adrenal masses. No renal mass. No hydronephrosis or nephrolithiasis. Completely decompressed with perivesicular stranding and a small left lateral bladder diverticulum. Stomach/Bowel: The stomach is decompressed without focal abnormality. No small bowel wall thickening or  inflammation. No small bowel obstruction. Normal appendix. Sigmoid diverticulosis. Lymphatic: No intraabdominal or pelvic lymphadenopathy. Reproductive: No prostatomegaly.no free pelvic fluid. Other: No pneumoperitoneum, ascites, or mesenteric inflammation. Musculoskeletal: No acute fracture or destructive lesion.Multilevel degenerative disc disease of the spine. Review of the MIP images confirms the above findings. IMPRESSION: 1. No aortic aneurysm, intramural hematoma, or aortic dissection. 2. Moderate cardiomegaly with dense multi-vessel coronary atherosclerosis. Small pericardial effusion with thickening of the pericardial sac and apparent induration of the epicardial fat, which may represent changes of pericarditis, in the correct clinical context. 3. Completely decompressed urinary bladder with perivesicular stranding and a small left lateral bladder diverticulum. Correlation with urinalysis recommended to exclude acute cystitis. 4. Sigmoid diverticulosis.  No changes of acute diverticulitis. Aortic Atherosclerosis (ICD10-I70.0). Electronically Signed   By: Rance Burrows M.D.   On: 09/12/2023 13:06   DG Chest Portable 1 View Result Date: 09/12/2023 CLINICAL DATA:  Chest pain and nausea beginning this morning. Atrial fibrillation. EXAM: PORTABLE CHEST 1 VIEW COMPARISON:  One-view chest x-ray 07/29/2023 FINDINGS: The heart is enlarged. Atherosclerotic calcifications are present at the aortic arch. Mild interstitial edema is present. Small effusions are suspected. IMPRESSION: Cardiomegaly and mild interstitial edema compatible with congestive heart failure. Electronically Signed   By: Audree Leas M.D.   On: 09/12/2023 11:44    Pending Labs Unresulted Labs (From admission, onward)     Start     Ordered   09/19/23 0500  Creatinine, serum  (enoxaparin  (LOVENOX )    CrCl >/= 30 ml/min)  Weekly,   R     Comments: while on enoxaparin  therapy    09/12/23 1535   09/13/23 0500  APTT  Tomorrow  morning,   R        09/12/23 1535   09/13/23 0500  Protime-INR  Tomorrow morning,   R        09/12/23 1535   09/13/23 0500  Basic metabolic panel  Tomorrow morning,   R        09/12/23 1535   09/13/23 0500  CBC  Tomorrow morning,   R        09/12/23 1535   09/12/23 1531  Blood gas, arterial  Once,   R       Comments: Room air if feasible. Thanks.   Question:  Room air or oxygen  Answer:  Room air   09/12/23 1530   09/12/23 1530  D-dimer, quantitative  Add-on,   AD        09/12/23 1530   09/12/23 1408  Urinalysis, Routine w reflex microscopic -Urine, Clean Catch  Once,   URGENT       Question:  Specimen Source  Answer:  Urine, Clean Catch   09/12/23  1408   09/12/23 1149  Lipase, blood  Add-on,   AD        09/12/23 1149            Vitals/Pain Today's Vitals   09/12/23 1140 09/12/23 1239 09/12/23 1300 09/12/23 1418  BP:   (!) 159/98 (!) 180/103  Pulse:   (!) 101 (!) 110  Resp:   19 16  Temp:    99.8 F (37.7 C)  TempSrc:    Oral  SpO2:   95% 95%  PainSc: 5  5   5      Isolation Precautions No active isolations  Medications Medications  celecoxib  (CELEBREX ) capsule 200 mg (200 mg Oral Given 09/12/23 1623)  HYDROcodone -acetaminophen  (NORCO/VICODIN) 5-325 MG per tablet 1-2 tablet (has no administration in time range)  senna-docusate (Senokot-S) tablet 2 tablet (2 tablets Oral Patient Refused/Not Given 09/12/23 1624)  enoxaparin  (LOVENOX ) injection 40 mg (has no administration in time range)  acetaminophen  (TYLENOL ) tablet 650 mg (has no administration in time range)    Or  acetaminophen  (TYLENOL ) suppository 650 mg (has no administration in time range)  polyethylene glycol (MIRALAX / GLYCOLAX) packet 17 g (has no administration in time range)  sodium chloride  flush (NS) 0.9 % injection 3 mL (3 mLs Intravenous Given 09/12/23 1624)  aspirin  chewable tablet 324 mg (324 mg Oral Given 09/12/23 1039)  fentaNYL  (SUBLIMAZE ) injection 50 mcg (50 mcg Intravenous Given 09/12/23 1043)   metoprolol  tartrate (LOPRESSOR ) injection 5 mg (5 mg Intravenous Given 09/12/23 1045)  fentaNYL  (SUBLIMAZE ) injection 50 mcg (50 mcg Intravenous Given 09/12/23 1239)  iohexol  (OMNIPAQUE ) 350 MG/ML injection 100 mL (100 mLs Intravenous Contrast Given 09/12/23 1204)    Mobility walks

## 2023-09-12 NOTE — Assessment & Plan Note (Signed)
 Patient seems not to be taking metoprolol  although it was not on his discharge medication from the last time, continue with metoprolol  25 twice daily.

## 2023-09-12 NOTE — Assessment & Plan Note (Signed)
 Mild chronic, check erythropoietin level.  I encouraged patient to quit smoking.  RN to provide smoking cessation material

## 2023-09-12 NOTE — Assessment & Plan Note (Signed)
 Patient has acute hypoxemic respiratory failure as I described in the HPI as above.  Etiology seems to be bibasilar dependent atelectasis as is clearly seen on the CAT scan.  Treatment would therefore be incentive spirometry.  Control of patient's pleuritic chest pain see below.  Review of CBC indicates chronic erythrocytosis which suggest chronic hypoxemia to me.  I discussed this with the patient and encouraged him to quit smoking.

## 2023-09-12 NOTE — H&P (Signed)
 History and Physical    Patient: [name] [name] DOB: January 07, 1961 DOA: 09/12/2023 DOS: the patient was seen and examined on 09/12/2023 PCP: Almira Jaeger, MD  Patient coming from: Home  Chief Complaint:  Chief Complaint  Patient presents with   Chest Pain   HPI: [name] [name] is a 63 y.o. male with medical history significant of recent diagnosis of atrial fibrillation.  Patient reports good occurrence with anticoagulation therapy at home.  Patient reports being in his usual state of health till he went to bed yesterday evening.  Since getting up this morning, and soon after patient reports a new substernal anterior chest pain sharp worse with deep inspiration.  No radiation no other complaint such as coughing or fever or leg swelling or cramping.  Shortness of breath only to the extent that patient cannot take a deep breath.  Patient came to the ER for further evaluation.  Extensive workup in the ER noted as below including a CAT scan of the chest with contrast to rule out dissection.  Patient unfortunately noted to be hypoxic in the ER with inability to wean oxygen as per report given to me by Dr. Monnie Anthony.  Patient is transferred to [facility] unit.  Patient has tolerated diet has received Celebrex  and hydrocodone  for pain control.  At this time reports very well-controlled pain.  Able to take deep breaths.  No other symptoms.  Remains on 2 L/min of supplementary oxygen. Past Medical History:  Diagnosis Date   Allergy    Boil 12/28/2021   Offered to lance, he declined Will use doxy for impetigo and he will use warm compresses.   Diverticulitis    leading to approx 1 foot resection-colectomy   History of kidney stones    Hx of adenomatous colonic polyps 10/19/2017   Hypertension    Impetigo any site 12/28/2021   Intertrigo 12/28/2021   NEPHROLITHIASIS, HX OF 03/09/2007   PONV (postoperative nausea and vomiting)    SPRAIN/STRAIN, ANKLE NOS 01/05/2007   Sweaty armpits  12/28/2021   Past Surgical History:  Procedure Laterality Date   COLECTOMY  09/2008   Dr Alray Askew   COLONOSCOPY     HERNIA REPAIR  09/18/12   lap incisional hernia repair, related to colectomy   VENTRAL HERNIA REPAIR N/A 09/18/2012   Social History:  reports that he quit smoking about 7 years ago. His smoking use included cigarettes and e-cigarettes. He has never used smokeless tobacco. He reports current alcohol use. He reports that he does not currently use drugs.  Allergies  Allergen Reactions   Benadryl  [Diphenhydramine  Hcl] Itching and Other (See Comments)    Patient stated allergy after ordered by MD   Morphine And Codeine Itching    Family History  Problem Relation Age of Onset   CVA Mother        57   Heart attack Father        60   Sleep apnea Father        noncompliant with CPAP   Pulmonary Hypertension Father        related to cpap noncompliance. died 56 sepsis    Colon cancer Neg Hx    Colon polyps Neg Hx    Esophageal cancer Neg Hx    Rectal cancer Neg Hx    Stomach cancer Neg Hx     Prior to Admission medications   Medication Sig Start Date End Date Taking? Authorizing Provider  apixaban  (ELIQUIS ) 5 MG TABS tablet Take  1 tablet (5 mg total) by mouth 2 (two) times daily. 07/31/23   Barbee Lew, MD  buPROPion  (WELLBUTRIN  XL) 150 MG 24 hr tablet TAKE 1 TABLET(150 MG) BY MOUTH DAILY Patient taking differently: Take 150 mg by mouth at bedtime. 06/11/23   Almira Jaeger, MD  metoprolol  tartrate (LOPRESSOR ) 25 MG tablet Take 1 tablet (25 mg total) by mouth 2 (two) times daily. 07/31/23   Barbee Lew, MD  rosuvastatin  (CRESTOR ) 5 MG tablet Take 1 tablet (5 mg total) by mouth daily. 07/31/23   Barbee Lew, MD  Secukinumab  (COSENTYX  UNOREADY) 300 MG/2ML SOAJ Inject 300 mg into the skin every 30 (thirty) days. Starting at week 4, inject one pen monthly 05/28/23   Paci, Karina M, MD    Physical Exam: Vitals:   09/12/23 1734 09/12/23 1819 09/12/23  1846 09/12/23 1847  BP: (!) 145/99  (!) 155/97   Pulse: (!) 116  (!) 106   Resp: 20  (!) 21   Temp: 98.3 F (36.8 C)  98.1 F (36.7 C)   TempSrc: Oral  Oral   SpO2: 94%  92% 97%  Weight:  (!) 137.6 kg    Height:  5\' 10"  (1.778 m)     General: Patient is alert and awake, gives a coherent account of his symptoms, accompanied by neighbor at bedside.  Patient wanted neighbor to stay for the entirety of the encounter Respiratory exam: Bilateral intravesicular Cardiovascular exam S1-S2 normal irregular Patient does appear obese Abdomen all quadrants soft nontender Extremities warm without edema. Data Reviewed:  Labs on Admission:  Results for orders placed or performed during the hospital encounter of 09/12/23 (from the past 24 hours)  D-dimer, quantitative     Status: Abnormal   Collection Time: 09/12/23 10:00 AM  Result Value Ref Range   D-Dimer, Quant 0.55 (H) 0.00 - 0.50 ug/mL-FEU  Troponin I (High Sensitivity)     Status: None   Collection Time: 09/12/23 10:31 AM  Result Value Ref Range   Troponin I (High Sensitivity) 8 <18 ng/L  CBC     Status: Abnormal   Collection Time: 09/12/23 10:31 AM  Result Value Ref Range   WBC 9.1 4.0 - 10.5 K/uL   RBC 5.15 4.22 - 5.81 MIL/uL   Hemoglobin 17.9 (H) 13.0 - 17.0 g/dL   HCT 14.7 82.9 - 56.2 %   MCV 100.8 (H) 80.0 - 100.0 fL   MCH 34.8 (H) 26.0 - 34.0 pg   MCHC 34.5 30.0 - 36.0 g/dL   RDW 13.0 86.5 - 78.4 %   Platelets 219 150 - 400 K/uL   nRBC 0.0 0.0 - 0.2 %  Comprehensive metabolic panel     Status: Abnormal   Collection Time: 09/12/23 10:31 AM  Result Value Ref Range   Sodium 137 135 - 145 mmol/L   Potassium 4.2 3.5 - 5.1 mmol/L   Chloride 106 98 - 111 mmol/L   CO2 22 22 - 32 mmol/L   Glucose, Bld 119 (H) 70 - 99 mg/dL   BUN 12 8 - 23 mg/dL   Creatinine, Ser 6.96 0.61 - 1.24 mg/dL   Calcium  8.8 (L) 8.9 - 10.3 mg/dL   Total Protein 7.7 6.5 - 8.1 g/dL   Albumin 4.0 3.5 - 5.0 g/dL   AST 22 15 - 41 U/L   ALT 21 0 - 44 U/L    Alkaline Phosphatase 52 38 - 126 U/L   Total Bilirubin 1.1 0.0 - 1.2 mg/dL  GFR, Estimated >60 >60 mL/min   Anion gap 9 5 - 15  Brain natriuretic peptide     Status: None   Collection Time: 09/12/23 10:32 AM  Result Value Ref Range   B Natriuretic Peptide 67.6 0.0 - 100.0 pg/mL  Resp panel by RT-PCR (RSV, Flu A&B, Covid) Anterior Nasal Swab     Status: None   Collection Time: 09/12/23 10:38 AM   Specimen: Anterior Nasal Swab  Result Value Ref Range   SARS Coronavirus 2 by RT PCR NEGATIVE NEGATIVE   Influenza A by PCR NEGATIVE NEGATIVE   Influenza B by PCR NEGATIVE NEGATIVE   Resp Syncytial Virus by PCR NEGATIVE NEGATIVE  Blood gas, arterial     Status: Abnormal   Collection Time: 09/12/23  4:00 PM  Result Value Ref Range   FIO2 21 %   pH, Arterial 7.41 7.35 - 7.45   pCO2 arterial 37 32 - 48 mmHg   pO2, Arterial 65 (L) 83 - 108 mmHg   Bicarbonate 23.5 20.0 - 28.0 mmol/L   Acid-base deficit 0.8 0.0 - 2.0 mmol/L   O2 Saturation 95.4 %   Patient temperature 37.0    Collection site LEFT RADIAL    Drawn by [id number]    Allens test (pass/fail) PASS PASS   Basic Metabolic Panel: Recent Labs  Lab 09/12/23 1031  NA 137  K 4.2  CL 106  CO2 22  GLUCOSE 119*  BUN 12  CREATININE 0.79  CALCIUM  8.8*   Liver Function Tests: Recent Labs  Lab 09/12/23 1031  AST 22  ALT 21  ALKPHOS 52  BILITOT 1.1  PROT 7.7  ALBUMIN 4.0   No results for input(s): "LIPASE", "AMYLASE" in the last 168 hours. No results for input(s): "AMMONIA" in the last 168 hours. CBC: Recent Labs  Lab 09/12/23 1031  WBC 9.1  HGB 17.9*  HCT 51.9  MCV 100.8*  PLT 219   Cardiac Enzymes: Recent Labs  Lab 09/12/23 1031  TROPONINIHS 8    BNP (last 3 results) No results for input(s): "PROBNP" in the last 8760 hours. CBG: No results for input(s): "GLUCAP" in the last 168 hours.  Radiological Exams on Admission:  CT Angio Chest/Abd/Pel for Dissection W and/or Wo Contrast Result Date:  09/12/2023 CLINICAL DATA:  Acute aortic syndrome (AAS) suspected, chest pain and nausea, atrial fibrillation EXAM: CT ANGIOGRAPHY CHEST, ABDOMEN AND PELVIS TECHNIQUE: Non-contrast CT of the chest was initially obtained. Multidetector CT imaging through the chest, abdomen and pelvis was performed using the standard protocol during bolus administration of intravenous contrast. Multiplanar reconstructed images and MIPs were obtained and reviewed to evaluate the vascular anatomy. RADIATION DOSE REDUCTION: This exam was performed according to the departmental dose-optimization program which includes automated exposure control, adjustment of the mA and/or kV according to patient size and/or use of iterative reconstruction technique. CONTRAST:  OMNIPAQUE  IOHEXOL  350 MG/ML SOLN COMPARISON:  July 29, 2023, August 03, 2020 FINDINGS: CTA CHEST FINDINGS Pulmonary Embolism: No pulmonary embolism. Cardiovascular: Moderate cardiomegaly. Dense multi-vessel coronary atherosclerosis. Small amount of pericardial effusion with apparent thickening of the pericardium and induration of the cardiophrenic fat. No aortic aneurysm, intramural hematoma, or aortic dissection. Variant 3 vessel aortic arch anatomy with the left common carotid arising from the brachiocephalic artery. Diminutive left vertebral artery arises directly from the aortic arch. Scattered calcified atherosclerosis throughout the thoracic aorta. Mediastinum/Nodes: No mediastinal mass. No mediastinal, hilar, or axillary lymphadenopathy. Lungs/Pleura: The midline trachea and bronchi are patent. No focal airspace consolidation,  pleural effusion, or pneumothorax. Posterior bibasilar dependent atelectasis. Fibrolinear scarring in the lingula and left lower lobe. Musculoskeletal: No acute fracture or destructive bone lesion. Thoracic DISH. Review of the MIP images confirms the above findings. CTA ABDOMEN AND PELVIS FINDINGS VASCULAR Aorta: No aortic aneurysm, intramural  hematoma, or dissection. Scattered calcified atherosclerosis throughout the aorta. No hemodynamically significant stenosis. Celiac: Patent without acute thrombus, aneurysm, or dissection. No hemodynamically significant stenosis. SMA: Patent without acute thrombus, aneurysm, or dissection. No hemodynamically significant stenosis. Renals: 2 right renal arteries with a single left renal artery noted. Patent without acute thrombus, aneurysm, or dissection. No hemodynamically significant stenosis. IMA: Patent without acute thrombus, aneurysm, or dissection. No hemodynamically significant stenosis. Inflow: Patent without acute thrombus, aneurysm, or dissection. No hemodynamically significant stenosis. Proximal Outflow: The bilateral common femoral and visualized portions of the superficial and profunda femoral arteries are patent without acute thrombus, aneurysm, or dissection. No hemodynamically significant stenosis. Veins: Not well evaluated due to the arterial phase of contrast. Review of the MIP images confirms the above findings. NON-VASCULAR Hepatobiliary: No mass.No radiopaque stones or wall thickening of the gallbladder.No intrahepatic or extrahepatic biliary ductal dilation. Pancreas: No mass or main ductal dilation.No peripancreatic inflammation or fluid collection. Spleen: Normal size. No mass. Adrenals/Urinary Tract: No adrenal masses. No renal mass. No hydronephrosis or nephrolithiasis. Completely decompressed with perivesicular stranding and a small left lateral bladder diverticulum. Stomach/Bowel: The stomach is decompressed without focal abnormality. No small bowel wall thickening or inflammation. No small bowel obstruction. Normal appendix. Sigmoid diverticulosis. Lymphatic: No intraabdominal or pelvic lymphadenopathy. Reproductive: No prostatomegaly.no free pelvic fluid. Other: No pneumoperitoneum, ascites, or mesenteric inflammation. Musculoskeletal: No acute fracture or destructive lesion.Multilevel  degenerative disc disease of the spine. Review of the MIP images confirms the above findings. IMPRESSION: 1. No aortic aneurysm, intramural hematoma, or aortic dissection. 2. Moderate cardiomegaly with dense multi-vessel coronary atherosclerosis. Small pericardial effusion with thickening of the pericardial sac and apparent induration of the epicardial fat, which may represent changes of pericarditis, in the correct clinical context. 3. Completely decompressed urinary bladder with perivesicular stranding and a small left lateral bladder diverticulum. Correlation with urinalysis recommended to exclude acute cystitis. 4. Sigmoid diverticulosis.  No changes of acute diverticulitis. Aortic Atherosclerosis (ICD10-I70.0). Electronically Signed   By: Rance Burrows M.D.   On: 09/12/2023 13:06   DG Chest Portable 1 View Result Date: 09/12/2023 CLINICAL DATA:  Chest pain and nausea beginning this morning. Atrial fibrillation. EXAM: PORTABLE CHEST 1 VIEW COMPARISON:  One-view chest x-ray 07/29/2023 FINDINGS: The heart is enlarged. Atherosclerotic calcifications are present at the aortic arch. Mild interstitial edema is present. Small effusions are suspected. IMPRESSION: Cardiomegaly and mild interstitial edema compatible with congestive heart failure. Electronically Signed   By: Audree Leas M.D.   On: 09/12/2023 11:44    chest X-ray   No intake/output data recorded. No intake/output data recorded.      Assessment and Plan: * Hypoxia Patient has acute hypoxemic respiratory failure as I described in the HPI as above.  Etiology seems to be bibasilar dependent atelectasis as is clearly seen on the CAT scan.  Treatment would therefore be incentive spirometry.  Control of patient's pleuritic chest pain see below.  Review of CBC indicates chronic erythrocytosis which suggest chronic hypoxemia to me.  I discussed this with the patient and encouraged him to quit smoking.  Chest pain Patient is reporting  new onset of pleuritic chest pain as mentioned in HPI.  Patient's D-dimer is negative considering his age.  CAT scan with contrast is negative for any dissection or pneumonia.  Given negative D-dimer, I will cancel ultrasound for lower extremity Dopplers.  Principal concern given his pleuritic chest pain at this time is possible pericarditis.  EKG does not show any ST-T wave changes although it is hard to interpret given coarse A-fib and tachycardia.  Check echo limited.  Erythrocytosis Mild chronic, check erythropoietin level.  I encouraged patient to quit smoking.  RN to provide smoking cessation material  Atrial fibrillation with rapid ventricular response (HCC) This was last diagnosed on March 2025.  At this time patient's heart rate is above long-term target.  Likely because patient is not taking his metoprolol  due to some miscommunication.  Restart metoprolol  25 twice daily.  I discussed with the patient.  Continue with chronic apixaban .  Depression, major, single episode, moderate (HCC) Continue with Wellbutrin   HTN (hypertension) Patient seems not to be taking metoprolol  although it was not on his discharge medication from the last time, continue with metoprolol  25 twice daily.      Advance Care Planning:   Code Status: Full Code   Consults: none at this time.  Family Communication: per patient.  Severity of Illness: The appropriate patient status for this patient is OBSERVATION. Observation status is judged to be reasonable and necessary in order to provide the required intensity of service to ensure the patient's safety. The patient's presenting symptoms, physical exam findings, and initial radiographic and laboratory data in the context of their medical condition is felt to place them at decreased risk for further clinical deterioration. Furthermore, it is anticipated that the patient will be medically stable for discharge from the hospital within 2 midnights of admission.    Author: Bennie Brave, MD 09/12/2023 7:11 PM  For on call review [email or web address].

## 2023-09-12 NOTE — Assessment & Plan Note (Signed)
Continue with Wellbutrin. °

## 2023-09-12 NOTE — Assessment & Plan Note (Signed)
 Patient is reporting new onset of pleuritic chest pain as mentioned in HPI.  Patient's D-dimer is negative considering his age.  CAT scan with contrast is negative for any dissection or pneumonia.  Given negative D-dimer, I will cancel ultrasound for lower extremity Dopplers.  Principal concern given his pleuritic chest pain at this time is possible pericarditis.  EKG does not show any ST-T wave changes although it is hard to interpret given coarse A-fib and tachycardia.  Check echo limited.

## 2023-09-12 NOTE — ED Triage Notes (Signed)
 Pt reports chest pain and nausea that started this morning. PT states he was dx with a-fib 1 month ago.

## 2023-09-13 ENCOUNTER — Observation Stay (HOSPITAL_BASED_OUTPATIENT_CLINIC_OR_DEPARTMENT_OTHER)

## 2023-09-13 DIAGNOSIS — I3139 Other pericardial effusion (noninflammatory): Secondary | ICD-10-CM

## 2023-09-13 DIAGNOSIS — R0902 Hypoxemia: Secondary | ICD-10-CM | POA: Diagnosis not present

## 2023-09-13 LAB — APTT: aPTT: 35 s (ref 24–36)

## 2023-09-13 LAB — BASIC METABOLIC PANEL WITH GFR
Anion gap: 8 (ref 5–15)
BUN: 21 mg/dL (ref 8–23)
CO2: 24 mmol/L (ref 22–32)
Calcium: 8.9 mg/dL (ref 8.9–10.3)
Chloride: 103 mmol/L (ref 98–111)
Creatinine, Ser: 1.07 mg/dL (ref 0.61–1.24)
GFR, Estimated: >60 mL/min (ref >60)
Glucose, Bld: 103 mg/dL — ABNORMAL HIGH (ref 70–99)
Potassium: 4.1 mmol/L (ref 3.5–5.1)
Sodium: 135 mmol/L (ref 135–145)

## 2023-09-13 LAB — CBC
HCT: 47.1 % (ref 39.0–52.0)
Hemoglobin: 15.8 g/dL (ref 13.0–17.0)
MCH: 35.2 pg — ABNORMAL HIGH (ref 26.0–34.0)
MCHC: 33.5 g/dL (ref 30.0–36.0)
MCV: 104.9 fL — ABNORMAL HIGH (ref 80.0–100.0)
Platelets: 195 10*3/uL (ref 150–400)
RBC: 4.49 MIL/uL (ref 4.22–5.81)
RDW: 13.7 % (ref 11.5–15.5)
WBC: 8.6 10*3/uL (ref 4.0–10.5)
nRBC: 0 % (ref 0.0–0.2)

## 2023-09-13 LAB — ECHOCARDIOGRAM LIMITED
Calc EF: 71.4 %
Height: 70 in
S' Lateral: 3.3 cm
Single Plane A2C EF: 76.3 %
Single Plane A4C EF: 64.8 %
Weight: 4854.4 [oz_av]

## 2023-09-13 LAB — PROTIME-INR
INR: 1.4 — ABNORMAL HIGH (ref 0.8–1.2)
Prothrombin Time: 17 s — ABNORMAL HIGH (ref 11.4–15.2)

## 2023-09-13 MED ORDER — PNEUMOCOCCAL 20-VAL CONJ VACC 0.5 ML IM SUSY
0.5000 mL | PREFILLED_SYRINGE | INTRAMUSCULAR | Status: DC
  Filled 2023-09-13: qty 0.5

## 2023-09-13 NOTE — Progress Notes (Signed)
 PROGRESS NOTE    [name] [name]  WGN:[id number] DOB: Sep 11, 1960 DOA: 09/12/2023 PCP: Almira Jaeger, MD    Brief Narrative:  62 year old gentleman with history of recent diagnosed A-fib and admitted to the hospital 2 weeks ago, recurrent precordial pain woke up in the morning with substernal pain, sharp and pleuritic in nature worse with inspiration.  Recently started on anticoagulation and heart rate controlled.  Plan was to follow-up as outpatient to A-fib clinic for cardioversion.  In the emergency room hemodynamically stable.  Chest pain improved apparently he was mobilized and found to be hypoxemic 85% on room air.  Admitted.  Subjective: Patient seen and examined.  Denies any complaints at rest.  Getting echocardiogram. Pain is reproducible on deep breathing.  Troponins are negative.  Assessment & Plan:   Substernal chest pain, recurrent pleuritic chest pain: Troponins and follow-up EKGs are nonischemic. Echocardiogram 2 weeks ago and today without any regional wall motion abnormalities. CT angiogram shows heavily calcified coronaries.  Negative for PE.  Patient presenting back to the hospital with recurrent chest pain. Currently chest pain-free today. Patient will need ischemic evaluation.  Case discussed with cardiology who will see patient in consultation.  Currently does not need heparin infusion.  If recurrent chest pain, will stop Eliquis  and start patient on heparin. Symptomatic management.  Hypoxemia: Cause unknown.  Likely underlying sleep apnea.  CTA negative for parenchymal abnormalities on his lungs, negative for thromboembolism.  D-dimer normal.  Keep on oxygen to keep saturation 90% or more.  Mobilize.  Will need sleep study.  A-fib with RVR: Recently diagnosed.  Today rate controlled A-fib.  Metoprolol  25 mg twice daily.  Therapeutic on Eliquis .  Will continue Eliquis  today.  To be seen by cardiology tomorrow morning and if plan for cath can stop.  Depression: On  Wellbutrin .  Essential hypertension: Blood pressure stable on metoprolol .  Morbid obesity: Counseling done.  Patient likely has underlying sleep apnea and needs to be investigated after discharge.    DVT prophylaxis: SCDs Start: 09/12/23 1535 apixaban  (ELIQUIS ) tablet 5 mg   Code Status: Full code Family Communication: None at the bedside Disposition Plan: Status is: Observation The patient will require care spanning > 2 midnights and should be moved to inpatient because: Ongoing chest pain, ischemic evaluation     Consultants:  Cardiology  Procedures:  None  Antimicrobials:  None     Objective: Vitals:   09/12/23 2303 09/13/23 0351 09/13/23 0821 09/13/23 1154  BP: 115/81 (!) 140/78 137/89 (!) 132/109  Pulse: 90 78 89 79  Resp: (!) 22 20  20   Temp: 98 F (36.7 C) 98.7 F (37.1 C) 99.5 F (37.5 C) 97.7 F (36.5 C)  TempSrc: Oral Oral Oral Oral  SpO2: 94% 96% 96% 99%  Weight:      Height:        Intake/Output Summary (Last 24 hours) at 09/13/2023 1425 Last data filed at 09/13/2023 0824 Gross per 24 hour  Intake 120 ml  Output --  Net 120 ml   Filed Weights   09/12/23 1819  Weight: (!) 137.6 kg    Examination:  General exam: Appears calm and comfortable  Respiratory system: Clear to auscultation. Respiratory effort normal. Cardiovascular system: S1 & S2 heard, RRR.  No reproducible chest pain. Gastrointestinal system: Abdomen is nondistended, soft and nontender. No organomegaly or masses felt. Normal bowel sounds heard. Central nervous system: Alert and oriented. No focal neurological deficits. Extremities: Symmetric 5 x 5 power. Skin: No rashes, lesions  or ulcers Psychiatry: Judgement and insight appear normal. Mood & affect appropriate.     Data Reviewed: I have personally reviewed following labs and imaging studies  CBC: Recent Labs  Lab 09/12/23 1031 09/13/23 0604  WBC 9.1 8.6  HGB 17.9* 15.8  HCT 51.9 47.1  MCV 100.8* 104.9*  PLT 219  195   Basic Metabolic Panel: Recent Labs  Lab 09/12/23 1031 09/13/23 0604  NA 137 135  K 4.2 4.1  CL 106 103  CO2 22 24  GLUCOSE 119* 103*  BUN 12 21  CREATININE 0.79 1.07  CALCIUM  8.8* 8.9   GFR: Estimated Creatinine Clearance: 96.2 mL/min (by C-G formula based on SCr of 1.07 mg/dL). Liver Function Tests: Recent Labs  Lab 09/12/23 1031  AST 22  ALT 21  ALKPHOS 52  BILITOT 1.1  PROT 7.7  ALBUMIN 4.0   Recent Labs  Lab 09/12/23 1928  LIPASE 35   No results for input(s): "AMMONIA" in the last 168 hours. Coagulation Profile: Recent Labs  Lab 09/13/23 0604  INR 1.4*   Cardiac Enzymes: No results for input(s): "CKTOTAL", "CKMB", "CKMBINDEX", "TROPONINI" in the last 168 hours. BNP (last 3 results) No results for input(s): "PROBNP" in the last 8760 hours. HbA1C: No results for input(s): "HGBA1C" in the last 72 hours. CBG: No results for input(s): "GLUCAP" in the last 168 hours. Lipid Profile: No results for input(s): "CHOL", "HDL", "LDLCALC", "TRIG", "CHOLHDL", "LDLDIRECT" in the last 72 hours. Thyroid  Function Tests: No results for input(s): "TSH", "T4TOTAL", "FREET4", "T3FREE", "THYROIDAB" in the last 72 hours. Anemia Panel: No results for input(s): "VITAMINB12", "FOLATE", "FERRITIN", "TIBC", "IRON", "RETICCTPCT" in the last 72 hours. Sepsis Labs: No results for input(s): "PROCALCITON", "LATICACIDVEN" in the last 168 hours.  Recent Results (from the past 240 hours)  Resp panel by RT-PCR (RSV, Flu A&B, Covid) Anterior Nasal Swab     Status: None   Collection Time: 09/12/23 10:38 AM   Specimen: Anterior Nasal Swab  Result Value Ref Range Status   SARS Coronavirus 2 by RT PCR NEGATIVE NEGATIVE Final    Comment: (NOTE) SARS-CoV-2 target nucleic acids are NOT DETECTED.  The SARS-CoV-2 RNA is generally detectable in upper respiratory specimens during the acute phase of infection. The lowest concentration of SARS-CoV-2 viral copies this assay can detect  is 138 copies/mL. A negative result does not preclude SARS-Cov-2 infection and should not be used as the sole basis for treatment or other patient management decisions. A negative result may occur with  improper specimen collection/handling, submission of specimen other than nasopharyngeal swab, presence of viral mutation(s) within the areas targeted by this assay, and inadequate number of viral copies(<138 copies/mL). A negative result must be combined with clinical observations, patient history, and epidemiological information. The expected result is Negative.  Fact Sheet for Patients:  BloggerCourse.com  Fact Sheet for Healthcare Providers:  SeriousBroker.it  This test is no t yet approved or cleared by the [place]  FDA and  has been authorized for detection and/or diagnosis of SARS-CoV-2 by FDA under an Emergency Use Authorization (EUA). This EUA will remain  in effect (meaning this test can be used) for the duration of the COVID-19 declaration under Section 564(b)(1) of the Act, 21 U.S.C.section 360bbb-3(b)(1), unless the authorization is terminated  or revoked sooner.       Influenza A by PCR NEGATIVE NEGATIVE Final   Influenza B by PCR NEGATIVE NEGATIVE Final    Comment: (NOTE) The Xpert Xpress SARS-CoV-2/FLU/RSV plus assay is intended as an  aid in the diagnosis of influenza from Nasopharyngeal swab specimens and should not be used as a sole basis for treatment. Nasal washings and aspirates are unacceptable for Xpert Xpress SARS-CoV-2/FLU/RSV testing.  Fact Sheet for Patients: BloggerCourse.com  Fact Sheet for Healthcare Providers: SeriousBroker.it  This test is not yet approved or cleared by the [place]  FDA and has been authorized for detection and/or diagnosis of SARS-CoV-2 by FDA under an Emergency Use Authorization (EUA). This EUA will remain in effect  (meaning this test can be used) for the duration of the COVID-19 declaration under Section 564(b)(1) of the Act, 21 U.S.C. section 360bbb-3(b)(1), unless the authorization is terminated or revoked.     Resp Syncytial Virus by PCR NEGATIVE NEGATIVE Final    Comment: (NOTE) Fact Sheet for Patients: BloggerCourse.com  Fact Sheet for Healthcare Providers: SeriousBroker.it  This test is not yet approved or cleared by the [place]  FDA and has been authorized for detection and/or diagnosis of SARS-CoV-2 by FDA under an Emergency Use Authorization (EUA). This EUA will remain in effect (meaning this test can be used) for the duration of the COVID-19 declaration under Section 564(b)(1) of the Act, 21 U.S.C. section 360bbb-3(b)(1), unless the authorization is terminated or revoked.  Performed at [facility] ([place]), [address] [place], [place] [address]          Radiology Studies: ECHOCARDIOGRAM LIMITED Result Date: 09/13/2023    ECHOCARDIOGRAM LIMITED REPORT   Patient Name:   MARQUIL MINE Date of Exam: 09/13/2023 Medical Rec #:  [id number]      Height:       70.0 in Accession #:    [id number]     Weight:       303.4 lb Date of Birth:  09-29-1960      BSA:          2.492 m Patient Age:    63 years       BP:           140/78 mmHg Patient Gender: M              HR:           89 bpm. Exam Location:  Inpatient Procedure: Limited Echo, Cardiac Doppler and Color Doppler (Both Spectral and            Color Flow Doppler were utilized during procedure). Indications:    I31.3 Pericardial effusion (noninflammatory)  History:        Patient has prior history of Echocardiogram examinations, most                 recent 07/30/2023. Abnormal ECG, Arrythmias:Atrial Fibrillation,                 Signs/Symptoms:Chest Pain; Risk Factors:Hypertension and Current                 Smoker.  Sonographer:    Raynelle Callow RDCS Referring Phys: [id number] [facility]  [facility]  Sonographer Comments: Technically difficult study due to poor echo windows and patient is obese. Image acquisition challenging due to patient body habitus. IMPRESSIONS  1. Left ventricular ejection fraction, by estimation, is 60 to 65%. The left ventricle has normal function. The left ventricle has no regional wall motion abnormalities. There is mild left ventricular hypertrophy. Left ventricular diastolic parameters are indeterminate.  2. Right ventricular systolic function is mildly reduced. The right ventricular size is mildly enlarged. Tricuspid regurgitation signal is inadequate for assessing PA pressure.  3. Left atrial  size was severely dilated.  4. A small pericardial effusion is present. The pericardial effusion is posterior to the left ventricle.  5. The mitral valve is abnormal. Mild mitral valve regurgitation. No evidence of mitral stenosis.  6. The aortic valve was not well visualized. There is mild calcification of the aortic valve. There is mild thickening of the aortic valve. Aortic valve regurgitation is trivial. Aortic valve sclerosis is present, with no evidence of aortic valve stenosis.  7. Aortic dilatation noted. There is mild dilatation of the ascending aorta, measuring 39 mm.  8. The inferior vena cava is dilated in size with >50% respiratory variability, suggesting right atrial pressure of 8 mmHg. FINDINGS  Left Ventricle: Left ventricular ejection fraction, by estimation, is 60 to 65%. The left ventricle has normal function. The left ventricle has no regional wall motion abnormalities. The left ventricular internal cavity size was normal in size. There is  mild left ventricular hypertrophy. Left ventricular diastolic parameters are indeterminate. Right Ventricle: The right ventricular size is mildly enlarged. No increase in right ventricular wall thickness. Right ventricular systolic function is mildly reduced. Tricuspid regurgitation signal is inadequate for assessing PA pressure.  Left Atrium: Left atrial size was severely dilated. Right Atrium: Right atrial size was normal in size. Pericardium: A small pericardial effusion is present. The pericardial effusion is posterior to the left ventricle. Mitral Valve: The mitral valve is abnormal. There is mild thickening of the mitral valve leaflet(s). There is mild calcification of the mitral valve leaflet(s). Mild mitral annular calcification. Mild mitral valve regurgitation. No evidence of mitral valve stenosis. Tricuspid Valve: The tricuspid valve is normal in structure. Tricuspid valve regurgitation is mild . No evidence of tricuspid stenosis. Aortic Valve: The aortic valve was not well visualized. There is mild calcification of the aortic valve. There is mild thickening of the aortic valve. Aortic valve regurgitation is trivial. Aortic valve sclerosis is present, with no evidence of aortic valve stenosis. Pulmonic Valve: The pulmonic valve was normal in structure. Pulmonic valve regurgitation is not visualized. No evidence of pulmonic stenosis. Aorta: Aortic dilatation noted. There is mild dilatation of the ascending aorta, measuring 39 mm. Venous: The inferior vena cava is dilated in size with greater than 50% respiratory variability, suggesting right atrial pressure of 8 mmHg. IAS/Shunts: No atrial level shunt detected by color flow Doppler. Additional Comments: Spectral Doppler performed. Color Doppler performed.  LEFT VENTRICLE PLAX 2D LVIDd:         4.70 cm LVIDs:         3.30 cm LV PW:         1.40 cm LV IVS:        1.20 cm LVOT diam:     2.20 cm LVOT Area:     3.80 cm  LV Volumes (MOD) LV vol d, MOD A2C: 91.9 ml LV vol d, MOD A4C: 84.0 ml LV vol s, MOD A2C: 21.8 ml LV vol s, MOD A4C: 29.6 ml LV SV MOD A2C:     70.1 ml LV SV MOD A4C:     84.0 ml LV SV MOD BP:      63.1 ml IVC IVC diam: 2.45 cm LEFT ATRIUM         Index LA diam:    5.90 cm 2.37 cm/m   SHUNTS Systemic Diam: 2.20 cm Janelle Mediate MD Electronically signed by Janelle Mediate MD  Signature Date/Time: 09/13/2023/12:41:04 PM    Final    CT Angio Chest/Abd/Pel for Dissection W and/or Wo  Contrast Result Date: 09/12/2023 CLINICAL DATA:  Acute aortic syndrome (AAS) suspected, chest pain and nausea, atrial fibrillation EXAM: CT ANGIOGRAPHY CHEST, ABDOMEN AND PELVIS TECHNIQUE: Non-contrast CT of the chest was initially obtained. Multidetector CT imaging through the chest, abdomen and pelvis was performed using the standard protocol during bolus administration of intravenous contrast. Multiplanar reconstructed images and MIPs were obtained and reviewed to evaluate the vascular anatomy. RADIATION DOSE REDUCTION: This exam was performed according to the departmental dose-optimization program which includes automated exposure control, adjustment of the mA and/or kV according to patient size and/or use of iterative reconstruction technique. CONTRAST:  OMNIPAQUE  IOHEXOL  350 MG/ML SOLN COMPARISON:  July 29, 2023, August 03, 2020 FINDINGS: CTA CHEST FINDINGS Pulmonary Embolism: No pulmonary embolism. Cardiovascular: Moderate cardiomegaly. Dense multi-vessel coronary atherosclerosis. Small amount of pericardial effusion with apparent thickening of the pericardium and induration of the cardiophrenic fat. No aortic aneurysm, intramural hematoma, or aortic dissection. Variant 3 vessel aortic arch anatomy with the left common carotid arising from the brachiocephalic artery. Diminutive left vertebral artery arises directly from the aortic arch. Scattered calcified atherosclerosis throughout the thoracic aorta. Mediastinum/Nodes: No mediastinal mass. No mediastinal, hilar, or axillary lymphadenopathy. Lungs/Pleura: The midline trachea and bronchi are patent. No focal airspace consolidation, pleural effusion, or pneumothorax. Posterior bibasilar dependent atelectasis. Fibrolinear scarring in the lingula and left lower lobe. Musculoskeletal: No acute fracture or destructive bone lesion. Thoracic DISH. Review  of the MIP images confirms the above findings. CTA ABDOMEN AND PELVIS FINDINGS VASCULAR Aorta: No aortic aneurysm, intramural hematoma, or dissection. Scattered calcified atherosclerosis throughout the aorta. No hemodynamically significant stenosis. Celiac: Patent without acute thrombus, aneurysm, or dissection. No hemodynamically significant stenosis. SMA: Patent without acute thrombus, aneurysm, or dissection. No hemodynamically significant stenosis. Renals: 2 right renal arteries with a single left renal artery noted. Patent without acute thrombus, aneurysm, or dissection. No hemodynamically significant stenosis. IMA: Patent without acute thrombus, aneurysm, or dissection. No hemodynamically significant stenosis. Inflow: Patent without acute thrombus, aneurysm, or dissection. No hemodynamically significant stenosis. Proximal Outflow: The bilateral common femoral and visualized portions of the superficial and profunda femoral arteries are patent without acute thrombus, aneurysm, or dissection. No hemodynamically significant stenosis. Veins: Not well evaluated due to the arterial phase of contrast. Review of the MIP images confirms the above findings. NON-VASCULAR Hepatobiliary: No mass.No radiopaque stones or wall thickening of the gallbladder.No intrahepatic or extrahepatic biliary ductal dilation. Pancreas: No mass or main ductal dilation.No peripancreatic inflammation or fluid collection. Spleen: Normal size. No mass. Adrenals/Urinary Tract: No adrenal masses. No renal mass. No hydronephrosis or nephrolithiasis. Completely decompressed with perivesicular stranding and a small left lateral bladder diverticulum. Stomach/Bowel: The stomach is decompressed without focal abnormality. No small bowel wall thickening or inflammation. No small bowel obstruction. Normal appendix. Sigmoid diverticulosis. Lymphatic: No intraabdominal or pelvic lymphadenopathy. Reproductive: No prostatomegaly.no free pelvic fluid. Other: No  pneumoperitoneum, ascites, or mesenteric inflammation. Musculoskeletal: No acute fracture or destructive lesion.Multilevel degenerative disc disease of the spine. Review of the MIP images confirms the above findings. IMPRESSION: 1. No aortic aneurysm, intramural hematoma, or aortic dissection. 2. Moderate cardiomegaly with dense multi-vessel coronary atherosclerosis. Small pericardial effusion with thickening of the pericardial sac and apparent induration of the epicardial fat, which may represent changes of pericarditis, in the correct clinical context. 3. Completely decompressed urinary bladder with perivesicular stranding and a small left lateral bladder diverticulum. Correlation with urinalysis recommended to exclude acute cystitis. 4. Sigmoid diverticulosis.  No changes of acute diverticulitis. Aortic Atherosclerosis (ICD10-I70.0). Electronically  Signed   By: Rance Burrows M.D.   On: 09/12/2023 13:06   DG Chest Portable 1 View Result Date: 09/12/2023 CLINICAL DATA:  Chest pain and nausea beginning this morning. Atrial fibrillation. EXAM: PORTABLE CHEST 1 VIEW COMPARISON:  One-view chest x-ray 07/29/2023 FINDINGS: The heart is enlarged. Atherosclerotic calcifications are present at the aortic arch. Mild interstitial edema is present. Small effusions are suspected. IMPRESSION: Cardiomegaly and mild interstitial edema compatible with congestive heart failure. Electronically Signed   By: Audree Leas M.D.   On: 09/12/2023 11:44        Scheduled Meds:  apixaban   5 mg Oral BID   buPROPion   150 mg Oral QHS   celecoxib   200 mg Oral BID   metoprolol  tartrate  25 mg Oral BID   [START ON 09/14/2023] pneumococcal 20-valent conjugate vaccine  0.5 mL Intramuscular Tomorrow-1000   rosuvastatin   5 mg Oral Daily   senna-docusate  2 tablet Oral BID   sodium chloride  flush  3 mL Intravenous Q12H   Continuous Infusions:   LOS: 0 days       Vada Garibaldi, MD [facility]

## 2023-09-13 NOTE — Care Management Obs Status (Signed)
 MEDICARE OBSERVATION STATUS NOTIFICATION   Patient Details  Name: [name] [name] MRN: [id number] Date of Birth: 1960/06/20   Medicare Observation Status Notification Given:  Yes    Levie Ream, RN 09/13/2023, 2:59 PM

## 2023-09-13 NOTE — TOC Initial Note (Signed)
 Transition of Care [facility]) - Initial/Assessment Note    Patient Details  Name: [name] [name] MRN: [id number] Date of Birth: 16-Dec-1960  Transition of Care [facility]) CM/SW Contact:    Levie Ream, RN Phone Number: 09/13/2023, 3:05 PM  Clinical Narrative:                 [facility] consult for SA (tobacco cessation); spoke w/ pt in room; pt says he lives at home, and he plans to return at d/c; he identified POC Dr Lacey Pian (friend) [phone]; pt says he plans to drive home; he verified insurance/PCP; he denied SDOH risks; pt says he has crutches; he does not have HH services, or home oxygen; pt declined resources for SA (tobacco cessation); TOC will follow.  Expected Discharge Plan: Home/Self Care Barriers to Discharge: Continued Medical Work up   Patient Goals and CMS Choice Patient states their goals for this hospitalization and ongoing recovery are:: home CMS Medicare.gov Compare Post Acute Care list provided to:: Patient   [place] ownership interest in [facility].provided to:: Patient    Expected Discharge Plan and Services   Discharge Planning Services: CM Consult   Living arrangements for the past 2 months: Single Family Home                                      Prior Living Arrangements/Services Living arrangements for the past 2 months: Single Family Home Lives with:: Self Patient language and need for interpreter reviewed:: Yes Do you feel safe going back to the place where you live?: Yes      Need for Family Participation in Patient Care: Yes (Comment) Care giver support system in place?: Yes (comment) Current home services: DME (crutches) Criminal Activity/Legal Involvement Pertinent to Current Situation/Hospitalization: No - Comment as needed  Activities of Daily Living   ADL Screening (condition at time of admission) Independently performs ADLs?: Yes (appropriate for developmental age) Is the patient deaf or have difficulty  hearing?: No Does the patient have difficulty seeing, even when wearing glasses/contacts?: No Does the patient have difficulty concentrating, remembering, or making decisions?: No  Permission Sought/Granted Permission sought to share information with : Case Manager Permission granted to share information with : Yes, Verbal Permission Granted  Share Information with NAME: Case Manager     Permission granted to share info w Relationship: Dr Lacey Pian (friend) [phone]     Emotional Assessment Appearance:: Appears stated age Attitude/Demeanor/Rapport: Gracious Affect (typically observed): Accepting Orientation: : Oriented to Self, Oriented to Place, Oriented to  Time, Oriented to Situation Alcohol / Substance Use: Tobacco Use    Admission diagnosis:  Hypoxia [R09.02] Atrial fibrillation, unspecified type [facility]) [I48.91] Patient Active Problem List   Diagnosis Date Noted   Hypoxia 09/12/2023   Erythrocytosis 09/12/2023   Chest pain 09/12/2023   Atrial fibrillation with rapid ventricular response (HCC) 07/29/2023   Impetigo any site 12/28/2021   Intertrigo 12/28/2021   Boil 12/28/2021   Sweaty armpits 12/28/2021   Depression, major, single episode, moderate (HCC) 12/21/2019   Rhinitis, chronic 01/23/2018   ETD (Eustachian tube dysfunction), bilateral 01/23/2018   Bilateral impacted cerumen 01/23/2018   Morbid obesity (HCC) 11/06/2017   Hx of adenomatous colonic polyps 10/19/2017   Diverticulitis    Stress fracture 07/04/2014   Right-sided thoracic back pain 11/28/2013   HTN (hypertension) 05/09/2012   PCP:  Almira Jaeger, MD Pharmacy:  [facility] [id number] [place], [place] - [address] [place] [address] Phone: [phone] Fax: [phone]     Social Drivers of Health (SDOH) Social History: SDOH Screenings   Food Insecurity: No Food Insecurity (09/13/2023)  Housing: Low Risk  (09/13/2023)  Recent Concern: Housing  - High Risk (07/29/2023)  Transportation Needs: No Transportation Needs (09/13/2023)  Utilities: Not At Risk (09/13/2023)  Depression (PHQ2-9): Low Risk  (03/24/2023)  Social Connections: Moderately Isolated (09/12/2023)  Tobacco Use: Medium Risk (09/12/2023)   SDOH Interventions: Food Insecurity Interventions: Intervention Not Indicated, Inpatient TOC Housing Interventions: Intervention Not Indicated, Inpatient TOC Transportation Interventions: Intervention Not Indicated, Inpatient TOC Utilities Interventions: Intervention Not Indicated, Inpatient TOC   Readmission Risk Interventions     No data to display

## 2023-09-13 NOTE — Plan of Care (Signed)
   Problem: Education: Goal: Knowledge of General Education information will improve Description: Including pain rating scale, medication(s)/side effects and non-pharmacologic comfort measures Outcome: Progressing   Problem: Clinical Measurements: Goal: Ability to maintain clinical measurements within normal limits will improve Outcome: Progressing

## 2023-09-13 NOTE — Progress Notes (Signed)
  Echocardiogram 2D Echocardiogram has been performed.  Royden Corin 09/13/2023, 9:55 AM

## 2023-09-14 DIAGNOSIS — I4891 Unspecified atrial fibrillation: Secondary | ICD-10-CM | POA: Diagnosis not present

## 2023-09-14 DIAGNOSIS — Z7901 Long term (current) use of anticoagulants: Secondary | ICD-10-CM | POA: Diagnosis not present

## 2023-09-14 DIAGNOSIS — I10 Essential (primary) hypertension: Secondary | ICD-10-CM | POA: Diagnosis not present

## 2023-09-14 DIAGNOSIS — Z79899 Other long term (current) drug therapy: Secondary | ICD-10-CM | POA: Diagnosis not present

## 2023-09-14 DIAGNOSIS — F32A Depression, unspecified: Secondary | ICD-10-CM | POA: Diagnosis not present

## 2023-09-14 DIAGNOSIS — R0902 Hypoxemia: Secondary | ICD-10-CM | POA: Diagnosis not present

## 2023-09-14 DIAGNOSIS — Z1152 Encounter for screening for COVID-19: Secondary | ICD-10-CM | POA: Diagnosis not present

## 2023-09-14 DIAGNOSIS — Z9049 Acquired absence of other specified parts of digestive tract: Secondary | ICD-10-CM | POA: Diagnosis not present

## 2023-09-14 DIAGNOSIS — R079 Chest pain, unspecified: Secondary | ICD-10-CM

## 2023-09-14 DIAGNOSIS — D751 Secondary polycythemia: Secondary | ICD-10-CM | POA: Diagnosis not present

## 2023-09-14 DIAGNOSIS — Z6841 Body Mass Index (BMI) 40.0 and over, adult: Secondary | ICD-10-CM | POA: Diagnosis not present

## 2023-09-14 DIAGNOSIS — Z87891 Personal history of nicotine dependence: Secondary | ICD-10-CM | POA: Diagnosis not present

## 2023-09-14 LAB — URINALYSIS, ROUTINE W REFLEX MICROSCOPIC
Bilirubin Urine: NEGATIVE
Glucose, UA: NEGATIVE mg/dL
Ketones, ur: NEGATIVE mg/dL
Leukocytes,Ua: NEGATIVE
Nitrite: NEGATIVE
Protein, ur: 100 mg/dL — AB
Specific Gravity, Urine: 1.021 (ref 1.005–1.030)
pH: 5 (ref 5.0–8.0)

## 2023-09-14 LAB — SEDIMENTATION RATE: Sed Rate: 41 mm/h — ABNORMAL HIGH (ref 0–16)

## 2023-09-14 LAB — ERYTHROPOIETIN: Erythropoietin: 17.1 m[IU]/mL (ref 2.6–18.5)

## 2023-09-14 MED ORDER — IBUPROFEN 200 MG PO TABS
400.0000 mg | ORAL_TABLET | Freq: Two times a day (BID) | ORAL | Status: DC
  Administered 2023-09-14 – 2023-09-16 (×4): 400 mg via ORAL
  Filled 2023-09-14 (×4): qty 2

## 2023-09-14 MED ORDER — PANTOPRAZOLE SODIUM 40 MG PO TBEC
40.0000 mg | DELAYED_RELEASE_TABLET | Freq: Every day | ORAL | Status: DC
  Administered 2023-09-14 – 2023-09-16 (×3): 40 mg via ORAL
  Filled 2023-09-14 (×3): qty 1

## 2023-09-14 MED ORDER — COLCHICINE 0.6 MG PO TABS
0.6000 mg | ORAL_TABLET | Freq: Two times a day (BID) | ORAL | Status: DC
  Administered 2023-09-14 – 2023-09-16 (×4): 0.6 mg via ORAL
  Filled 2023-09-14 (×4): qty 1

## 2023-09-14 NOTE — Progress Notes (Signed)
 PROGRESS NOTE    [name] [name]  ZOX:[id number] DOB: [birth year] DOA: 09/12/2023 PCP: Almira Jaeger, MD    Brief Narrative:  63 year old gentleman with history of recent diagnosed A-fib and admitted to the hospital 2 weeks ago, recurrent precordial pain woke up in the morning with substernal pain, sharp and pleuritic in nature worse with inspiration.  Recently started on anticoagulation and heart rate controlled.  Plan was to follow-up as outpatient to A-fib clinic for cardioversion.  In the emergency room hemodynamically stable.  Chest pain improved apparently he was mobilized and found to be hypoxemic 85% on room air.  Admitted.  Subjective: Asking when cards is going to see, no chest pain currently  Assessment & Plan:   Substernal chest pain, recurrent pleuritic chest pain: ? pericarditis Troponins and follow-up EKGs are nonischemic. CT angiogram shows heavily calcified coronaries.  Negative for PE.  Patient presenting back to the hospital with recurrent chest pain. Chest pain-free Cardiology consultation Inflammatory markers pending  Hypoxemia:  - On room air - home O2 study  A-fib with RVR: Recently diagnosed.  Today rate controlled A-fib.  Metoprolol  25 mg twice daily.  Therapeutic on Eliquis .   Depression: On Wellbutrin .  Essential hypertension: Blood pressure stable on metoprolol .  Morbid obesity:  Estimated body mass index is 43.53 kg/m as calculated from the following:   Height as of this encounter: 5\' 10"  (1.778 m).   Weight as of this encounter: 137.6 kg.     DVT prophylaxis: SCDs Start: 09/12/23 1535 apixaban  (ELIQUIS ) tablet 5 mg   Code Status: Full code Family Communication: None at the bedside Disposition Plan: Status is: Observation      Consultants:  Cardiology      Objective: Vitals:   09/13/23 0821 09/13/23 1154 09/13/23 2045 09/14/23 0417  BP: 137/89 (!) 132/109 (!) 152/80 (!) 135/91  Pulse: 89 79 (!) 101 94  Resp:  20    Temp:  99.5 F (37.5 C) 97.7 F (36.5 C) 99.2 F (37.3 C) (!) 97.5 F (36.4 C)  TempSrc: Oral Oral Oral Oral  SpO2: 96% 99% 93% 94%  Weight:      Height:        Intake/Output Summary (Last 24 hours) at 09/14/2023 1214 Last data filed at 09/13/2023 2300 Gross per 24 hour  Intake 50 ml  Output --  Net 50 ml   Filed Weights   09/12/23 1819  Weight: (!) 137.6 kg    Examination:   General: Appearance:    Severely obese male in no acute distress     Lungs:      respirations unlabored  Heart:    Normal heart rate.   MS:   All extremities are intact.   Neurologic:   Awake, alert       Data Reviewed: I have personally reviewed following labs and imaging studies  CBC: Recent Labs  Lab 09/12/23 1031 09/13/23 0604  WBC 9.1 8.6  HGB 17.9* 15.8  HCT 51.9 47.1  MCV 100.8* 104.9*  PLT 219 195   Basic Metabolic Panel: Recent Labs  Lab 09/12/23 1031 09/13/23 0604  NA 137 135  K 4.2 4.1  CL 106 103  CO2 22 24  GLUCOSE 119* 103*  BUN 12 21  CREATININE 0.79 1.07  CALCIUM  8.8* 8.9   GFR: Estimated Creatinine Clearance: 96.2 mL/min (by C-G formula based on SCr of 1.07 mg/dL). Liver Function Tests: Recent Labs  Lab 09/12/23 1031  AST 22  ALT 21  ALKPHOS 52  BILITOT 1.1  PROT 7.7  ALBUMIN 4.0   Recent Labs  Lab 09/12/23 1928  LIPASE 35   No results for input(s): "AMMONIA" in the last 168 hours. Coagulation Profile: Recent Labs  Lab 09/13/23 0604  INR 1.4*   Cardiac Enzymes: No results for input(s): "CKTOTAL", "CKMB", "CKMBINDEX", "TROPONINI" in the last 168 hours. BNP (last 3 results) No results for input(s): "PROBNP" in the last 8760 hours. HbA1C: No results for input(s): "HGBA1C" in the last 72 hours. CBG: No results for input(s): "GLUCAP" in the last 168 hours. Lipid Profile: No results for input(s): "CHOL", "HDL", "LDLCALC", "TRIG", "CHOLHDL", "LDLDIRECT" in the last 72 hours. Thyroid  Function Tests: No results for input(s): "TSH", "T4TOTAL",  "FREET4", "T3FREE", "THYROIDAB" in the last 72 hours. Anemia Panel: No results for input(s): "VITAMINB12", "FOLATE", "FERRITIN", "TIBC", "IRON", "RETICCTPCT" in the last 72 hours. Sepsis Labs: No results for input(s): "PROCALCITON", "LATICACIDVEN" in the last 168 hours.  Recent Results (from the past 240 hours)  Resp panel by RT-PCR (RSV, Flu A&B, Covid) Anterior Nasal Swab     Status: None   Collection Time: 09/12/23 10:38 AM   Specimen: Anterior Nasal Swab  Result Value Ref Range Status   SARS Coronavirus 2 by RT PCR NEGATIVE NEGATIVE Final    Comment: (NOTE) SARS-CoV-2 target nucleic acids are NOT DETECTED.  The SARS-CoV-2 RNA is generally detectable in upper respiratory specimens during the acute phase of infection. The lowest concentration of SARS-CoV-2 viral copies this assay can detect is 138 copies/mL. A negative result does not preclude SARS-Cov-2 infection and should not be used as the sole basis for treatment or other patient management decisions. A negative result may occur with  improper specimen collection/handling, submission of specimen other than nasopharyngeal swab, presence of viral mutation(s) within the areas targeted by this assay, and inadequate number of viral copies(<138 copies/mL). A negative result must be combined with clinical observations, patient history, and epidemiological information. The expected result is Negative.  Fact Sheet for Patients:  BloggerCourse.com  Fact Sheet for Healthcare Providers:  SeriousBroker.it  This test is no t yet approved or cleared by the [place]  FDA and  has been authorized for detection and/or diagnosis of SARS-CoV-2 by FDA under an Emergency Use Authorization (EUA). This EUA will remain  in effect (meaning this test can be used) for the duration of the COVID-19 declaration under Section 564(b)(1) of the Act, 21 U.S.C.section 360bbb-3(b)(1), unless the  authorization is terminated  or revoked sooner.       Influenza A by PCR NEGATIVE NEGATIVE Final   Influenza B by PCR NEGATIVE NEGATIVE Final    Comment: (NOTE) The Xpert Xpress SARS-CoV-2/FLU/RSV plus assay is intended as an aid in the diagnosis of influenza from Nasopharyngeal swab specimens and should not be used as a sole basis for treatment. Nasal washings and aspirates are unacceptable for Xpert Xpress SARS-CoV-2/FLU/RSV testing.  Fact Sheet for Patients: BloggerCourse.com  Fact Sheet for Healthcare Providers: SeriousBroker.it  This test is not yet approved or cleared by the [place]  FDA and has been authorized for detection and/or diagnosis of SARS-CoV-2 by FDA under an Emergency Use Authorization (EUA). This EUA will remain in effect (meaning this test can be used) for the duration of the COVID-19 declaration under Section 564(b)(1) of the Act, 21 U.S.C. section 360bbb-3(b)(1), unless the authorization is terminated or revoked.     Resp Syncytial Virus by PCR NEGATIVE NEGATIVE Final    Comment: (NOTE) Fact Sheet for Patients: BloggerCourse.com  Fact Sheet  for Healthcare Providers: SeriousBroker.it  This test is not yet approved or cleared by the [place]  FDA and has been authorized for detection and/or diagnosis of SARS-CoV-2 by FDA under an Emergency Use Authorization (EUA). This EUA will remain in effect (meaning this test can be used) for the duration of the COVID-19 declaration under Section 564(b)(1) of the Act, 21 U.S.C. section 360bbb-3(b)(1), unless the authorization is terminated or revoked.  Performed at [facility], [address] [place], [place] [address]          Radiology Studies: ECHOCARDIOGRAM LIMITED Result Date: 09/13/2023    ECHOCARDIOGRAM LIMITED REPORT   Patient Name:   [name] [name] Date of Exam:  09/13/2023 Medical Rec #:  [id number]      Height:       70.0 in Accession #:    [id number]     Weight:       303.4 lb Date of Birth:  13-Apr-1961      BSA:          2.492 m Patient Age:    63 years       BP:           140/78 mmHg Patient Gender: M              HR:           89 bpm. Exam Location:  Inpatient Procedure: Limited Echo, Cardiac Doppler and Color Doppler (Both Spectral and            Color Flow Doppler were utilized during procedure). Indications:    I31.3 Pericardial effusion (noninflammatory)  History:        Patient has prior history of Echocardiogram examinations, most                 recent 07/30/2023. Abnormal ECG, Arrythmias:Atrial Fibrillation,                 Signs/Symptoms:Chest Pain; Risk Factors:Hypertension and Current                 Smoker.  Sonographer:    Raynelle Callow RDCS Referring Phys: [id number] [facility] GOEL  Sonographer Comments: Technically difficult study due to poor echo windows and patient is obese. Image acquisition challenging due to patient body habitus. IMPRESSIONS  1. Left ventricular ejection fraction, by estimation, is 60 to 65%. The left ventricle has normal function. The left ventricle has no regional wall motion abnormalities. There is mild left ventricular hypertrophy. Left ventricular diastolic parameters are indeterminate.  2. Right ventricular systolic function is mildly reduced. The right ventricular size is mildly enlarged. Tricuspid regurgitation signal is inadequate for assessing PA pressure.  3. Left atrial size was severely dilated.  4. A small pericardial effusion is present. The pericardial effusion is posterior to the left ventricle.  5. The mitral valve is abnormal. Mild mitral valve regurgitation. No evidence of mitral stenosis.  6. The aortic valve was not well visualized. There is mild calcification of the aortic valve. There is mild thickening of the aortic valve. Aortic valve regurgitation is trivial. Aortic valve sclerosis is present, with no evidence of aortic  valve stenosis.  7. Aortic dilatation noted. There is mild dilatation of the ascending aorta, measuring 39 mm.  8. The inferior vena cava is dilated in size with >50% respiratory variability, suggesting right atrial pressure of 8 mmHg. FINDINGS  Left Ventricle: Left ventricular ejection fraction, by estimation, is 60 to 65%. The left ventricle has normal function. The left  ventricle has no regional wall motion abnormalities. The left ventricular internal cavity size was normal in size. There is  mild left ventricular hypertrophy. Left ventricular diastolic parameters are indeterminate. Right Ventricle: The right ventricular size is mildly enlarged. No increase in right ventricular wall thickness. Right ventricular systolic function is mildly reduced. Tricuspid regurgitation signal is inadequate for assessing PA pressure. Left Atrium: Left atrial size was severely dilated. Right Atrium: Right atrial size was normal in size. Pericardium: A small pericardial effusion is present. The pericardial effusion is posterior to the left ventricle. Mitral Valve: The mitral valve is abnormal. There is mild thickening of the mitral valve leaflet(s). There is mild calcification of the mitral valve leaflet(s). Mild mitral annular calcification. Mild mitral valve regurgitation. No evidence of mitral valve stenosis. Tricuspid Valve: The tricuspid valve is normal in structure. Tricuspid valve regurgitation is mild . No evidence of tricuspid stenosis. Aortic Valve: The aortic valve was not well visualized. There is mild calcification of the aortic valve. There is mild thickening of the aortic valve. Aortic valve regurgitation is trivial. Aortic valve sclerosis is present, with no evidence of aortic valve stenosis. Pulmonic Valve: The pulmonic valve was normal in structure. Pulmonic valve regurgitation is not visualized. No evidence of pulmonic stenosis. Aorta: Aortic dilatation noted. There is mild dilatation of the ascending aorta,  measuring 39 mm. Venous: The inferior vena cava is dilated in size with greater than 50% respiratory variability, suggesting right atrial pressure of 8 mmHg. IAS/Shunts: No atrial level shunt detected by color flow Doppler. Additional Comments: Spectral Doppler performed. Color Doppler performed.  LEFT VENTRICLE PLAX 2D LVIDd:         4.70 cm LVIDs:         3.30 cm LV PW:         1.40 cm LV IVS:        1.20 cm LVOT diam:     2.20 cm LVOT Area:     3.80 cm  LV Volumes (MOD) LV vol d, MOD A2C: 91.9 ml LV vol d, MOD A4C: 84.0 ml LV vol s, MOD A2C: 21.8 ml LV vol s, MOD A4C: 29.6 ml LV SV MOD A2C:     70.1 ml LV SV MOD A4C:     84.0 ml LV SV MOD BP:      63.1 ml IVC IVC diam: 2.45 cm LEFT ATRIUM         Index LA diam:    5.90 cm 2.37 cm/m   SHUNTS Systemic Diam: 2.20 cm Janelle Mediate MD Electronically signed by Janelle Mediate MD Signature Date/Time: 09/13/2023/12:41:04 PM    Final         Scheduled Meds:  apixaban   5 mg Oral BID   buPROPion   150 mg Oral QHS   celecoxib   200 mg Oral BID   metoprolol  tartrate  25 mg Oral BID   pneumococcal 20-valent conjugate vaccine  0.5 mL Intramuscular Tomorrow-1000   rosuvastatin   5 mg Oral Daily   senna-docusate  2 tablet Oral BID   sodium chloride  flush  3 mL Intravenous Q12H   Continuous Infusions:   LOS: 0 days       Meshell Abdulaziz U Ethyn Schetter, DO [facility]

## 2023-09-14 NOTE — Consult Note (Addendum)
 Cardiology Consultation   Patient ID: [name] MRN: [id number]; DOB: [birth year]  Admit date: 09/12/2023 Date of Consult: 09/14/2023  PCP:  Almira Jaeger, MD   [facility] Providers Cardiologist:  Avanell Bob     Patient Profile:   [name] [name] is a 63 y.o. male with a hx of HTN, obesity, depressoin, afib who is being seen 09/14/2023 for the evaluation of chest pain at the request of Dr Mikle Alexanders.  History of Present Illness:   [name] [name] 63 yo male history of HTN, obesity, depressoin, afib diagnosed 07/2023 during admission. He was rate controlled with plans for outpatient DCCV in 4 weeks. Chest pain that admission that resolved with rate control, negative workup for ACS. Don't see where patient followed up to consider DCCV. Admitted again with chest pain and SOB.Found to be hypoxic  On my history he reports at least 30 hours of constant sharp midchest/epigastric pain worst with position and deep breathing. + associated SOB. Similar to pain he had during 07/2023 admission. Pain resolved yesterday afternoon without recurrence.    Ddimer 0.55 WBC 9.1 Hgb 17.9 Plt 219 Na 137 K 4.2 Cr 0.79 BUN 12 BNP 68  Trop 8-->8 ABG 7.41/37/65/24 EKG: afib with RVR CXR: mild edema  CTA: no acute aortic pathology. Moderate cardiomegaly, multivessel CAD. Small pericardial effusion with thickening of pericardial sac, questionable pericarditis. Lungs are clear   07/2023 echo: LVEF 65-70%, no WMAs, normal RV function 09/2023 limited echo: LVEF 60-65%, no WMAs, severe LAE, small pericardial effusion, RA pressure 8 mmHg.      Past Medical History:  Diagnosis Date   Allergy    Boil 12/28/2021   Offered to lance, he declined Will use doxy for impetigo and he will use warm compresses.   Diverticulitis    leading to approx 1 foot resection-colectomy   History of kidney stones    Hx of adenomatous colonic polyps 10/19/2017   Hypertension    Impetigo any site 12/28/2021   Intertrigo 12/28/2021    NEPHROLITHIASIS, HX OF 03/09/2007   PONV (postoperative nausea and vomiting)    SPRAIN/STRAIN, ANKLE NOS 01/05/2007   Sweaty armpits 12/28/2021    Past Surgical History:  Procedure Laterality Date   COLECTOMY  09/2008   Dr Alray Askew   COLONOSCOPY     HERNIA REPAIR  09/18/12   lap incisional hernia repair, related to colectomy   VENTRAL HERNIA REPAIR N/A 09/18/2012      Inpatient Medications: Scheduled Meds:  apixaban   5 mg Oral BID   buPROPion   150 mg Oral QHS   celecoxib   200 mg Oral BID   metoprolol  tartrate  25 mg Oral BID   pneumococcal 20-valent conjugate vaccine  0.5 mL Intramuscular Tomorrow-1000   rosuvastatin   5 mg Oral Daily   senna-docusate  2 tablet Oral BID   sodium chloride  flush  3 mL Intravenous Q12H   Continuous Infusions:  PRN Meds: acetaminophen  **OR** acetaminophen , HYDROcodone -acetaminophen , polyethylene glycol  Allergies:    Allergies  Allergen Reactions   Benadryl  [Diphenhydramine  Hcl] Itching and Other (See Comments)    Patient stated allergy after ordered by MD   Morphine And Codeine Itching    Social History:   Social History   Socioeconomic History   Marital status: Single    Spouse name: Not on file   Number of children: Not on file   Years of education: Not on file   Highest education level: Not on file  Occupational History   Not on file  Tobacco Use   Smoking status: Former    Current packs/day: 0.00    Types: Cigarettes, E-cigarettes    Quit date: 05/13/2016    Years since quitting: 7.3   Smokeless tobacco: Never   Tobacco comments:    Doing Vape in the evenings  Vaping Use   Vaping status: Former  Substance and Sexual Activity   Alcohol use: Yes    Alcohol/week: 0.0 standard drinks of alcohol    Comment: occasionally   Drug use: Not Currently   Sexual activity: Never  Other Topics Concern   Not on file  Social History Narrative   Single. Lives alone. Monogamous.    Long term friend of Dr. 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.       Hobbies: working around [employer], mountain bike   Social Drivers of Corporate investment banker Strain: Not on file  Food Insecurity: No Food Insecurity (09/13/2023)   Hunger Vital Sign    Worried About Running Out of Food in the Last Year: Never true    Ran Out of Food in the Last Year: Never true  Transportation Needs: No Transportation Needs (09/13/2023)   PRAPARE - Administrator, Civil Service (Medical): No    Lack of Transportation (Non-Medical): No  Physical Activity: Not on file  Stress: Not on file  Social Connections: Moderately Isolated (09/12/2023)   Social Connection and Isolation Panel [NHANES]    Frequency of Communication with Friends and Family: More than three times a week    Frequency of Social Gatherings with Friends and Family: More than three times a week    Attends Religious Services: More than 4 times per year    Active Member of [employer] or Organizations: No    Attends Banker Meetings: Never    Marital Status: Divorced  Catering manager Violence: Not At Risk (09/13/2023)   Humiliation, Afraid, Rape, and Kick questionnaire    Fear of Current or Ex-Partner: No    Emotionally Abused: No    Physically Abused: No    Sexually Abused: No    Family History:    Family History  Problem Relation Age of Onset   CVA Mother        51   Heart attack Father        60   Sleep apnea Father        noncompliant with CPAP   Pulmonary Hypertension Father        related to cpap noncompliance. died 28 sepsis    Colon cancer Neg Hx    Colon polyps Neg Hx    Esophageal cancer Neg Hx    Rectal cancer Neg Hx    Stomach cancer Neg Hx      ROS:  Please see the history of present illness.   All other ROS reviewed and negative.     Physical Exam/Data:   Vitals:   09/13/23 0821 09/13/23 1154 09/13/23 2045 09/14/23 0417  BP: 137/89 (!) 132/109 (!) 152/80 (!) 135/91  Pulse: 89 79  (!) 101 94  Resp:  20    Temp: 99.5 F (37.5 C) 97.7 F (36.5 C) 99.2 F (37.3 C) (!) 97.5 F (36.4 C)  TempSrc: Oral Oral Oral Oral  SpO2: 96% 99% 93% 94%  Weight:      Height:        Intake/Output Summary (Last 24 hours) at 09/14/2023 0915  Last data filed at 09/13/2023 2300 Gross per 24 hour  Intake 50 ml  Output --  Net 50 ml      09/12/2023    6:19 PM 07/29/2023   11:49 PM 03/24/2023    1:07 PM  Last 3 Weights  Weight (lbs) 303 lb 6.4 oz 135 lb 3.2 oz --  Weight (kg) 137.621 kg 61.326 kg --     Body mass index is 43.53 kg/m.  General:  Well nourished, well developed, in no acute distress HEENT: normal Neck: no JVD Vascular: No carotid bruits; Distal pulses 2+ bilaterally Cardiac:  irregular Lungs:  clear to auscultation bilaterally, no wheezing, rhonchi or rales  Abd: soft, nontender, no hepatomegaly  Ext: no edema Musculoskeletal:  No deformities, BUE and BLE strength normal and equal Skin: warm and dry  Neuro:  CNs 2-12 intact, no focal abnormalities noted Psych:  Normal affect     Laboratory Data:  High Sensitivity Troponin:   Recent Labs  Lab 09/12/23 1031 09/12/23 1928  TROPONINIHS 8 8     Chemistry Recent Labs  Lab 09/12/23 1031 09/13/23 0604  NA 137 135  K 4.2 4.1  CL 106 103  CO2 22 24  GLUCOSE 119* 103*  BUN 12 21  CREATININE 0.79 1.07  CALCIUM  8.8* 8.9  GFRNONAA >60 >60  ANIONGAP 9 8    Recent Labs  Lab 09/12/23 1031  PROT 7.7  ALBUMIN 4.0  AST 22  ALT 21  ALKPHOS 52  BILITOT 1.1   Lipids No results for input(s): "CHOL", "TRIG", "HDL", "LABVLDL", "LDLCALC", "CHOLHDL" in the last 168 hours.  Hematology Recent Labs  Lab 09/12/23 1031 09/13/23 0604  WBC 9.1 8.6  RBC 5.15 4.49  HGB 17.9* 15.8  HCT 51.9 47.1  MCV 100.8* 104.9*  MCH 34.8* 35.2*  MCHC 34.5 33.5  RDW 13.3 13.7  PLT 219 195   Thyroid  No results for input(s): "TSH", "FREET4" in the last 168 hours.  BNP Recent Labs  Lab 09/12/23 1032  BNP 67.6     DDimer  Recent Labs  Lab 09/12/23 1000  DDIMER 0.55*     Radiology/Studies:  ECHOCARDIOGRAM LIMITED Result Date: 09/13/2023    ECHOCARDIOGRAM LIMITED REPORT   Patient Name:   [name] [name] Date of Exam: 09/13/2023 Medical Rec #:  [id number]      Height:       70.0 in Accession #:    [id number]     Weight:       303.4 lb Date of Birth:  01-Aug-1960      BSA:          2.492 m Patient Age:    63 years       BP:           140/78 mmHg Patient Gender: M              HR:           89 bpm. Exam Location:  Inpatient Procedure: Limited Echo, Cardiac Doppler and Color Doppler (Both Spectral and            Color Flow Doppler were utilized during procedure). Indications:    I31.3 Pericardial effusion (noninflammatory)  History:        Patient has prior history of Echocardiogram examinations, most                 recent 07/30/2023. Abnormal ECG, Arrythmias:Atrial Fibrillation,  Signs/Symptoms:Chest Pain; Risk Factors:Hypertension and Current                 Smoker.  Sonographer:    Raynelle Callow RDCS Referring Phys: [id number] [facility] GOEL  Sonographer Comments: Technically difficult study due to poor echo windows and patient is obese. Image acquisition challenging due to patient body habitus. IMPRESSIONS  1. Left ventricular ejection fraction, by estimation, is 60 to 65%. The left ventricle has normal function. The left ventricle has no regional wall motion abnormalities. There is mild left ventricular hypertrophy. Left ventricular diastolic parameters are indeterminate.  2. Right ventricular systolic function is mildly reduced. The right ventricular size is mildly enlarged. Tricuspid regurgitation signal is inadequate for assessing PA pressure.  3. Left atrial size was severely dilated.  4. A small pericardial effusion is present. The pericardial effusion is posterior to the left ventricle.  5. The mitral valve is abnormal. Mild mitral valve regurgitation. No evidence of mitral stenosis.  6. The aortic valve was  not well visualized. There is mild calcification of the aortic valve. There is mild thickening of the aortic valve. Aortic valve regurgitation is trivial. Aortic valve sclerosis is present, with no evidence of aortic valve stenosis.  7. Aortic dilatation noted. There is mild dilatation of the ascending aorta, measuring 39 mm.  8. The inferior vena cava is dilated in size with >50% respiratory variability, suggesting right atrial pressure of 8 mmHg. FINDINGS  Left Ventricle: Left ventricular ejection fraction, by estimation, is 60 to 65%. The left ventricle has normal function. The left ventricle has no regional wall motion abnormalities. The left ventricular internal cavity size was normal in size. There is  mild left ventricular hypertrophy. Left ventricular diastolic parameters are indeterminate. Right Ventricle: The right ventricular size is mildly enlarged. No increase in right ventricular wall thickness. Right ventricular systolic function is mildly reduced. Tricuspid regurgitation signal is inadequate for assessing PA pressure. Left Atrium: Left atrial size was severely dilated. Right Atrium: Right atrial size was normal in size. Pericardium: A small pericardial effusion is present. The pericardial effusion is posterior to the left ventricle. Mitral Valve: The mitral valve is abnormal. There is mild thickening of the mitral valve leaflet(s). There is mild calcification of the mitral valve leaflet(s). Mild mitral annular calcification. Mild mitral valve regurgitation. No evidence of mitral valve stenosis. Tricuspid Valve: The tricuspid valve is normal in structure. Tricuspid valve regurgitation is mild . No evidence of tricuspid stenosis. Aortic Valve: The aortic valve was not well visualized. There is mild calcification of the aortic valve. There is mild thickening of the aortic valve. Aortic valve regurgitation is trivial. Aortic valve sclerosis is present, with no evidence of aortic valve stenosis. Pulmonic  Valve: The pulmonic valve was normal in structure. Pulmonic valve regurgitation is not visualized. No evidence of pulmonic stenosis. Aorta: Aortic dilatation noted. There is mild dilatation of the ascending aorta, measuring 39 mm. Venous: The inferior vena cava is dilated in size with greater than 50% respiratory variability, suggesting right atrial pressure of 8 mmHg. IAS/Shunts: No atrial level shunt detected by color flow Doppler. Additional Comments: Spectral Doppler performed. Color Doppler performed.  LEFT VENTRICLE PLAX 2D LVIDd:         4.70 cm LVIDs:         3.30 cm LV PW:         1.40 cm LV IVS:        1.20 cm LVOT diam:     2.20 cm LVOT Area:  3.80 cm  LV Volumes (MOD) LV vol d, MOD A2C: 91.9 ml LV vol d, MOD A4C: 84.0 ml LV vol s, MOD A2C: 21.8 ml LV vol s, MOD A4C: 29.6 ml LV SV MOD A2C:     70.1 ml LV SV MOD A4C:     84.0 ml LV SV MOD BP:      63.1 ml IVC IVC diam: 2.45 cm LEFT ATRIUM         Index LA diam:    5.90 cm 2.37 cm/m   SHUNTS Systemic Diam: 2.20 cm Janelle Mediate MD Electronically signed by Janelle Mediate MD Signature Date/Time: 09/13/2023/12:41:04 PM    Final    CT Angio Chest/Abd/Pel for Dissection W and/or Wo Contrast Result Date: 09/12/2023 CLINICAL DATA:  Acute aortic syndrome (AAS) suspected, chest pain and nausea, atrial fibrillation EXAM: CT ANGIOGRAPHY CHEST, ABDOMEN AND PELVIS TECHNIQUE: Non-contrast CT of the chest was initially obtained. Multidetector CT imaging through the chest, abdomen and pelvis was performed using the standard protocol during bolus administration of intravenous contrast. Multiplanar reconstructed images and MIPs were obtained and reviewed to evaluate the vascular anatomy. RADIATION DOSE REDUCTION: This exam was performed according to the departmental dose-optimization program which includes automated exposure control, adjustment of the mA and/or kV according to patient size and/or use of iterative reconstruction technique. CONTRAST:  OMNIPAQUE  IOHEXOL   350 MG/ML SOLN COMPARISON:  July 29, 2023, August 03, 2020 FINDINGS: CTA CHEST FINDINGS Pulmonary Embolism: No pulmonary embolism. Cardiovascular: Moderate cardiomegaly. Dense multi-vessel coronary atherosclerosis. Small amount of pericardial effusion with apparent thickening of the pericardium and induration of the cardiophrenic fat. No aortic aneurysm, intramural hematoma, or aortic dissection. Variant 3 vessel aortic arch anatomy with the left common carotid arising from the brachiocephalic artery. Diminutive left vertebral artery arises directly from the aortic arch. Scattered calcified atherosclerosis throughout the thoracic aorta. Mediastinum/Nodes: No mediastinal mass. No mediastinal, hilar, or axillary lymphadenopathy. Lungs/Pleura: The midline trachea and bronchi are patent. No focal airspace consolidation, pleural effusion, or pneumothorax. Posterior bibasilar dependent atelectasis. Fibrolinear scarring in the lingula and left lower lobe. Musculoskeletal: No acute fracture or destructive bone lesion. Thoracic DISH. Review of the MIP images confirms the above findings. CTA ABDOMEN AND PELVIS FINDINGS VASCULAR Aorta: No aortic aneurysm, intramural hematoma, or dissection. Scattered calcified atherosclerosis throughout the aorta. No hemodynamically significant stenosis. Celiac: Patent without acute thrombus, aneurysm, or dissection. No hemodynamically significant stenosis. SMA: Patent without acute thrombus, aneurysm, or dissection. No hemodynamically significant stenosis. Renals: 2 right renal arteries with a single left renal artery noted. Patent without acute thrombus, aneurysm, or dissection. No hemodynamically significant stenosis. IMA: Patent without acute thrombus, aneurysm, or dissection. No hemodynamically significant stenosis. Inflow: Patent without acute thrombus, aneurysm, or dissection. No hemodynamically significant stenosis. Proximal Outflow: The bilateral common femoral and visualized portions  of the superficial and profunda femoral arteries are patent without acute thrombus, aneurysm, or dissection. No hemodynamically significant stenosis. Veins: Not well evaluated due to the arterial phase of contrast. Review of the MIP images confirms the above findings. NON-VASCULAR Hepatobiliary: No mass.No radiopaque stones or wall thickening of the gallbladder.No intrahepatic or extrahepatic biliary ductal dilation. Pancreas: No mass or main ductal dilation.No peripancreatic inflammation or fluid collection. Spleen: Normal size. No mass. Adrenals/Urinary Tract: No adrenal masses. No renal mass. No hydronephrosis or nephrolithiasis. Completely decompressed with perivesicular stranding and a small left lateral bladder diverticulum. Stomach/Bowel: The stomach is decompressed without focal abnormality. No small bowel wall thickening or inflammation. No small bowel obstruction. Normal  appendix. Sigmoid diverticulosis. Lymphatic: No intraabdominal or pelvic lymphadenopathy. Reproductive: No prostatomegaly.no free pelvic fluid. Other: No pneumoperitoneum, ascites, or mesenteric inflammation. Musculoskeletal: No acute fracture or destructive lesion.Multilevel degenerative disc disease of the spine. Review of the MIP images confirms the above findings. IMPRESSION: 1. No aortic aneurysm, intramural hematoma, or aortic dissection. 2. Moderate cardiomegaly with dense multi-vessel coronary atherosclerosis. Small pericardial effusion with thickening of the pericardial sac and apparent induration of the epicardial fat, which may represent changes of pericarditis, in the correct clinical context. 3. Completely decompressed urinary bladder with perivesicular stranding and a small left lateral bladder diverticulum. Correlation with urinalysis recommended to exclude acute cystitis. 4. Sigmoid diverticulosis.  No changes of acute diverticulitis. Aortic Atherosclerosis (ICD10-I70.0). Electronically Signed   By: Rance Burrows M.D.    On: 09/12/2023 13:06   DG Chest Portable 1 View Result Date: 09/12/2023 CLINICAL DATA:  Chest pain and nausea beginning this morning. Atrial fibrillation. EXAM: PORTABLE CHEST 1 VIEW COMPARISON:  One-view chest x-ray 07/29/2023 FINDINGS: The heart is enlarged. Atherosclerotic calcifications are present at the aortic arch. Mild interstitial edema is present. Small effusions are suspected. IMPRESSION: Cardiomegaly and mild interstitial edema compatible with congestive heart failure. Electronically Signed   By: Audree Leas M.D.   On: 09/12/2023 11:44     Assessment and Plan:   Chest pain - on history he gives me 30 hours of constant sharp midchest pain worst with position and deep breathing, not consistent with ischemic cardiac chest pain. No objective evidence of ischemia by EKG or enzymes. No plans for ischemic testing.  - CTA no acute aortic pathology. shows thickening of pericardium, small effusion - echo normal LVEF, no WMAs, small effusion - ESR and CRP are pending.  - EKG poor quality, not able to assess for any pericarditis like changes. Will repeat EKG   - most likely this is pericarditis. F/u inflammatory markers, if elevated then will need to start antiinflamatory regimen. Will need to decide on precise regimen given he is on DOAC.    2. Afib - initially tachycardic in ER in setting of chest pain, hypoxia - tele review overnight shows rate controlled - he is on lopressor  25mg  bid, eliquis  5mg  bid.   3. Coronary atheroscerlsosi - noted on CTA this admission  4. Hypoxia -unclear etiology - CT chest benign lungs. BNP not elevated. On eliquis  at home PE low likelihood - perpahs some transient diastolic dysfunction if this is truly pericarditis, difficult to assess echo for this given he is in afib - hypoxia has resolved   Addendum 550pm Labs late returning, sed rate elevated and up from last admission when presented with questionable pericarditis. CRP is pending. WIl go  ahead and start medical therapy for pericarditis. From review there is no clear well established guidance on management of pericarditis in setting of DOAC use. Either accept higher bleeding risk with NSAIDs or higher recurrence risk with steroids. From review reasonable for short low dose NSAID course with PPI for GI protection.   Start colchicine 0.6mg  bid, ibuprofen 400mg  bid x 7 days, protonix  40mg  daily.   For questions or updates, please contact [facility] Please consult [email or web address] for contact info under    Signed, Armida Lander, MD  09/14/2023 9:15 AM

## 2023-09-14 NOTE — Progress Notes (Signed)
 12 Lead EKG completed and copy placed in chart.

## 2023-09-15 DIAGNOSIS — R0902 Hypoxemia: Secondary | ICD-10-CM | POA: Diagnosis not present

## 2023-09-15 DIAGNOSIS — I10 Essential (primary) hypertension: Secondary | ICD-10-CM | POA: Diagnosis not present

## 2023-09-15 DIAGNOSIS — F32A Depression, unspecified: Secondary | ICD-10-CM | POA: Diagnosis not present

## 2023-09-15 DIAGNOSIS — Z87891 Personal history of nicotine dependence: Secondary | ICD-10-CM | POA: Diagnosis not present

## 2023-09-15 DIAGNOSIS — Z79899 Other long term (current) drug therapy: Secondary | ICD-10-CM | POA: Diagnosis not present

## 2023-09-15 DIAGNOSIS — Z9049 Acquired absence of other specified parts of digestive tract: Secondary | ICD-10-CM | POA: Diagnosis not present

## 2023-09-15 DIAGNOSIS — Z7901 Long term (current) use of anticoagulants: Secondary | ICD-10-CM | POA: Diagnosis not present

## 2023-09-15 DIAGNOSIS — D751 Secondary polycythemia: Secondary | ICD-10-CM | POA: Diagnosis not present

## 2023-09-15 DIAGNOSIS — Z6841 Body Mass Index (BMI) 40.0 and over, adult: Secondary | ICD-10-CM | POA: Diagnosis not present

## 2023-09-15 DIAGNOSIS — I4891 Unspecified atrial fibrillation: Secondary | ICD-10-CM | POA: Diagnosis not present

## 2023-09-15 DIAGNOSIS — Z1152 Encounter for screening for COVID-19: Secondary | ICD-10-CM | POA: Diagnosis not present

## 2023-09-15 LAB — BASIC METABOLIC PANEL WITH GFR
Anion gap: 8 (ref 5–15)
BUN: 19 mg/dL (ref 8–23)
CO2: 25 mmol/L (ref 22–32)
Calcium: 8.8 mg/dL — ABNORMAL LOW (ref 8.9–10.3)
Chloride: 104 mmol/L (ref 98–111)
Creatinine, Ser: 0.78 mg/dL (ref 0.61–1.24)
GFR, Estimated: >60 mL/min (ref >60)
Glucose, Bld: 155 mg/dL — ABNORMAL HIGH (ref 70–99)
Potassium: 3.9 mmol/L (ref 3.5–5.1)
Sodium: 137 mmol/L (ref 135–145)

## 2023-09-15 LAB — CBC
HCT: 45.1 % (ref 39.0–52.0)
Hemoglobin: 15.1 g/dL (ref 13.0–17.0)
MCH: 34.8 pg — ABNORMAL HIGH (ref 26.0–34.0)
MCHC: 33.5 g/dL (ref 30.0–36.0)
MCV: 103.9 fL — ABNORMAL HIGH (ref 80.0–100.0)
Platelets: 203 10*3/uL (ref 150–400)
RBC: 4.34 MIL/uL (ref 4.22–5.81)
RDW: 13.2 % (ref 11.5–15.5)
WBC: 5.8 10*3/uL (ref 4.0–10.5)
nRBC: 0 % (ref 0.0–0.2)

## 2023-09-15 MED ORDER — METOPROLOL TARTRATE 50 MG PO TABS
50.0000 mg | ORAL_TABLET | Freq: Two times a day (BID) | ORAL | Status: DC
  Administered 2023-09-15 – 2023-09-16 (×2): 50 mg via ORAL
  Filled 2023-09-15 (×2): qty 1

## 2023-09-15 NOTE — Progress Notes (Signed)
 PROGRESS NOTE    [name] [name]  HYQ:[id number] DOB: Mar 29, 1961 DOA: 09/12/2023 PCP: Almira Jaeger, MD    Brief Narrative:  63 year old gentleman with history of recent diagnosed A-fib and admitted to the hospital 2 weeks ago, recurrent precordial pain woke up in the morning with substernal pain, sharp and pleuritic in nature worse with inspiration.  Recently started on anticoagulation and heart rate controlled.  Plan was to follow-up as outpatient to A-fib clinic for cardioversion.  In the emergency room hemodynamically stable.  Chest pain improved apparently he was mobilized and found to be hypoxemic 85% on room air.  Admitted.  Subjective: No further chest pain  Assessment & Plan:   Substernal chest pain, recurrent pleuritic chest pain: ? pericarditis Troponins and follow-up EKGs are nonischemic. CT angiogram shows heavily calcified coronaries.  Negative for PE.  Patient presenting back to the hospital with recurrent chest pain. Chest pain-free Cardiology consultation:Started Rx for pericarditis  Colchicine 0.6 bid; ibuprofen 400 bid (x 7 days)  and protonix  (given Eliquis  use)  Pt currently CP free    Ambulate   IF does OK can d/c tomorrow am   Inflammatory markers elevated   Hypoxemia:  - On room air - home O2 study negative  A-fib with RVR: Recently diagnosed.  Today rate controlled A-fib.  Metoprolol  25 mg twice daily.  Therapeutic on Eliquis .  -needs sleepy study  Depression: On Wellbutrin .  Essential hypertension: Blood pressure stable on metoprolol .  Morbid obesity:  Estimated body mass index is 43.53 kg/m as calculated from the following:   Height as of this encounter: 5\' 10"  (1.778 m).   Weight as of this encounter: 137.6 kg.     DVT prophylaxis: SCDs Start: 09/12/23 1535 apixaban  (ELIQUIS ) tablet 5 mg   Code Status: Full code Family Communication: None at the bedside Disposition Plan: Status is: Observation      Consultants:   Cardiology      Objective: Vitals:   09/14/23 1322 09/14/23 2032 09/14/23 2058 09/15/23 0318  BP: (!) 155/90 (!) 169/101 (!) 159/88 (!) 148/90  Pulse: 73 92 91 94  Resp: 20 20  18   Temp: 98.8 F (37.1 C) 98.5 F (36.9 C)  98.6 F (37 C)  TempSrc: Oral Oral  Oral  SpO2: 95% 96%  93%  Weight:      Height:        Intake/Output Summary (Last 24 hours) at 09/15/2023 1239 Last data filed at 09/14/2023 2100 Gross per 24 hour  Intake 50 ml  Output --  Net 50 ml   Filed Weights   09/12/23 1819  Weight: (!) 137.6 kg    Examination:   Up walking in room Appears anxious but otherwise fine     Data Reviewed: I have personally reviewed following labs and imaging studies  CBC: Recent Labs  Lab 09/12/23 1031 09/13/23 0604 09/15/23 0457  WBC 9.1 8.6 5.8  HGB 17.9* 15.8 15.1  HCT 51.9 47.1 45.1  MCV 100.8* 104.9* 103.9*  PLT 219 195 203   Basic Metabolic Panel: Recent Labs  Lab 09/12/23 1031 09/13/23 0604 09/15/23 0453  NA 137 135 137  K 4.2 4.1 3.9  CL 106 103 104  CO2 22 24 25   GLUCOSE 119* 103* 155*  BUN 12 21 19   CREATININE 0.79 1.07 0.78  CALCIUM  8.8* 8.9 8.8*   GFR: Estimated Creatinine Clearance: 128.6 mL/min (by C-G formula based on SCr of 0.78 mg/dL). Liver Function Tests: Recent Labs  Lab 09/12/23 1031  AST 22  ALT 21  ALKPHOS 52  BILITOT 1.1  PROT 7.7  ALBUMIN 4.0   Recent Labs  Lab 09/12/23 1928  LIPASE 35   No results for input(s): "AMMONIA" in the last 168 hours. Coagulation Profile: Recent Labs  Lab 09/13/23 0604  INR 1.4*   Cardiac Enzymes: No results for input(s): "CKTOTAL", "CKMB", "CKMBINDEX", "TROPONINI" in the last 168 hours. BNP (last 3 results) No results for input(s): "PROBNP" in the last 8760 hours. HbA1C: No results for input(s): "HGBA1C" in the last 72 hours. CBG: No results for input(s): "GLUCAP" in the last 168 hours. Lipid Profile: No results for input(s): "CHOL", "HDL", "LDLCALC", "TRIG", "CHOLHDL",  "LDLDIRECT" in the last 72 hours. Thyroid  Function Tests: No results for input(s): "TSH", "T4TOTAL", "FREET4", "T3FREE", "THYROIDAB" in the last 72 hours. Anemia Panel: No results for input(s): "VITAMINB12", "FOLATE", "FERRITIN", "TIBC", "IRON", "RETICCTPCT" in the last 72 hours. Sepsis Labs: No results for input(s): "PROCALCITON", "LATICACIDVEN" in the last 168 hours.  Recent Results (from the past 240 hours)  Resp panel by RT-PCR (RSV, Flu A&B, Covid) Anterior Nasal Swab     Status: None   Collection Time: 09/12/23 10:38 AM   Specimen: Anterior Nasal Swab  Result Value Ref Range Status   SARS Coronavirus 2 by RT PCR NEGATIVE NEGATIVE Final    Comment: (NOTE) SARS-CoV-2 target nucleic acids are NOT DETECTED.  The SARS-CoV-2 RNA is generally detectable in upper respiratory specimens during the acute phase of infection. The lowest concentration of SARS-CoV-2 viral copies this assay can detect is 138 copies/mL. A negative result does not preclude SARS-Cov-2 infection and should not be used as the sole basis for treatment or other patient management decisions. A negative result may occur with  improper specimen collection/handling, submission of specimen other than nasopharyngeal swab, presence of viral mutation(s) within the areas targeted by this assay, and inadequate number of viral copies(<138 copies/mL). A negative result must be combined with clinical observations, patient history, and epidemiological information. The expected result is Negative.  Fact Sheet for Patients:  BloggerCourse.com  Fact Sheet for Healthcare Providers:  SeriousBroker.it  This test is no t yet approved or cleared by the [place]  FDA and  has been authorized for detection and/or diagnosis of SARS-CoV-2 by FDA under an Emergency Use Authorization (EUA). This EUA will remain  in effect (meaning this test can be used) for the duration of the COVID-19  declaration under Section 564(b)(1) of the Act, 21 U.S.C.section 360bbb-3(b)(1), unless the authorization is terminated  or revoked sooner.       Influenza A by PCR NEGATIVE NEGATIVE Final   Influenza B by PCR NEGATIVE NEGATIVE Final    Comment: (NOTE) The Xpert Xpress SARS-CoV-2/FLU/RSV plus assay is intended as an aid in the diagnosis of influenza from Nasopharyngeal swab specimens and should not be used as a sole basis for treatment. Nasal washings and aspirates are unacceptable for Xpert Xpress SARS-CoV-2/FLU/RSV testing.  Fact Sheet for Patients: BloggerCourse.com  Fact Sheet for Healthcare Providers: SeriousBroker.it  This test is not yet approved or cleared by the [place]  FDA and has been authorized for detection and/or diagnosis of SARS-CoV-2 by FDA under an Emergency Use Authorization (EUA). This EUA will remain in effect (meaning this test can be used) for the duration of the COVID-19 declaration under Section 564(b)(1) of the Act, 21 U.S.C. section 360bbb-3(b)(1), unless the authorization is terminated or revoked.     Resp Syncytial Virus by PCR NEGATIVE NEGATIVE Final    Comment: (  NOTE) Fact Sheet for Patients: BloggerCourse.com  Fact Sheet for Healthcare Providers: SeriousBroker.it  This test is not yet approved or cleared by the [place]  FDA and has been authorized for detection and/or diagnosis of SARS-CoV-2 by FDA under an Emergency Use Authorization (EUA). This EUA will remain in effect (meaning this test can be used) for the duration of the COVID-19 declaration under Section 564(b)(1) of the Act, 21 U.S.C. section 360bbb-3(b)(1), unless the authorization is terminated or revoked.  Performed at [facility], [address], [place], [place] [address]          Radiology Studies: No results found.       Scheduled  Meds:  apixaban   5 mg Oral BID   buPROPion   150 mg Oral QHS   colchicine  0.6 mg Oral BID   ibuprofen  400 mg Oral BID   metoprolol  tartrate  50 mg Oral BID   pantoprazole   40 mg Oral Daily   pneumococcal 20-valent conjugate vaccine  0.5 mL Intramuscular Tomorrow-1000   rosuvastatin   5 mg Oral Daily   senna-docusate  2 tablet Oral BID   sodium chloride  flush  3 mL Intravenous Q12H   Continuous Infusions:   LOS: 0 days       B[place] U Gustave Lindeman, DO [facility]

## 2023-09-15 NOTE — Progress Notes (Signed)
 This RN received report from previous shift Charity fundraiser. Agree with documented assessment. Patient watching TV comfortably.

## 2023-09-15 NOTE — Progress Notes (Signed)
 Rounding Note    Patient Name: [name] [name] Date of Encounter: 09/15/2023    Subjective   PT denies CP  Breathing is OK   Inpatient Medications    Scheduled Meds:  apixaban   5 mg Oral BID   buPROPion   150 mg Oral QHS   colchicine  0.6 mg Oral BID   ibuprofen  400 mg Oral BID   metoprolol  tartrate  25 mg Oral BID   pantoprazole   40 mg Oral Daily   pneumococcal 20-valent conjugate vaccine  0.5 mL Intramuscular Tomorrow-1000   rosuvastatin   5 mg Oral Daily   senna-docusate  2 tablet Oral BID   sodium chloride  flush  3 mL Intravenous Q12H   Continuous Infusions:  PRN Meds: acetaminophen  **OR** acetaminophen , HYDROcodone -acetaminophen , polyethylene glycol   Vital Signs    Vitals:   09/14/23 1322 09/14/23 2032 09/14/23 2058 09/15/23 0318  BP: (!) 155/90 (!) 169/101 (!) 159/88 (!) 148/90  Pulse: 73 92 91 94  Resp: 20 20  18   Temp: 98.8 F (37.1 C) 98.5 F (36.9 C)  98.6 F (37 C)  TempSrc: Oral Oral  Oral  SpO2: 95% 96%  93%  Weight:      Height:        Intake/Output Summary (Last 24 hours) at 09/15/2023 1001 Last data filed at 09/14/2023 2100 Gross per 24 hour  Intake 290 ml  Output --  Net 290 ml      09/12/2023    6:19 PM 07/29/2023   11:49 PM 03/24/2023    1:07 PM  Last 3 Weights  Weight (lbs) 303 lb 6.4 oz 135 lb 3.2 oz --  Weight (kg) 137.621 kg 61.326 kg --      Telemetry    Atrial fibrillation  Average HR 80s  - Personally Reviewed  ECG    No new  - Personally Reviewed  Physical Exam   GEN: No acute distress.   Neck: No JVD Cardiac: Irreg irreg  No S3  No rubs   Respiratory: Clear to auscultation  GI: Soft, nontender   No masses MS: No LE edema; No deformity. Neuro:  Nonfocal   Labs    High Sensitivity Troponin:   Recent Labs  Lab 09/12/23 1031 09/12/23 1928  TROPONINIHS 8 8     Chemistry Recent Labs  Lab 09/12/23 1031 09/13/23 0604 09/15/23 0453  NA 137 135 137  K 4.2 4.1 3.9  CL 106 103 104  CO2 22 24 25   GLUCOSE  119* 103* 155*  BUN 12 21 19   CREATININE 0.79 1.07 0.78  CALCIUM  8.8* 8.9 8.8*  PROT 7.7  --   --   ALBUMIN 4.0  --   --   AST 22  --   --   ALT 21  --   --   ALKPHOS 52  --   --   BILITOT 1.1  --   --   GFRNONAA >60 >60 >60  ANIONGAP 9 8 8     Lipids No results for input(s): "CHOL", "TRIG", "HDL", "LABVLDL", "LDLCALC", "CHOLHDL" in the last 168 hours.  Hematology Recent Labs  Lab 09/12/23 1031 09/13/23 0604 09/15/23 0457  WBC 9.1 8.6 5.8  RBC 5.15 4.49 4.34  HGB 17.9* 15.8 15.1  HCT 51.9 47.1 45.1  MCV 100.8* 104.9* 103.9*  MCH 34.8* 35.2* 34.8*  MCHC 34.5 33.5 33.5  RDW 13.3 13.7 13.2  PLT 219 195 203   Thyroid  No results for input(s): "TSH", "FREET4" in the last 168  hours.  BNP Recent Labs  Lab 09/12/23 1032  BNP 67.6    DDimer  Recent Labs  Lab 09/12/23 1000  DDIMER 0.55*     Radiology    No results found.  Cardiac Studies   Echo  May 2025 1. Left ventricular ejection fraction, by estimation, is 60 to 65%. The  left ventricle has normal function. The left ventricle has no regional  wall motion abnormalities. There is mild left ventricular hypertrophy.  Left ventricular diastolic parameters  are indeterminate.   2. Right ventricular systolic function is mildly reduced. The right  ventricular size is mildly enlarged. Tricuspid regurgitation signal is  inadequate for assessing PA pressure.   3. Left atrial size was severely dilated.   4. A small pericardial effusion is present. The pericardial effusion is  posterior to the left ventricle.   5. The mitral valve is abnormal. Mild mitral valve regurgitation. No  evidence of mitral stenosis.   6. The aortic valve was not well visualized. There is mild calcification  of the aortic valve. There is mild thickening of the aortic valve. Aortic  valve regurgitation is trivial. Aortic valve sclerosis is present, with no  evidence of aortic valve  stenosis.   7. Aortic dilatation noted. There is mild  dilatation of the ascending  aorta, measuring 39 mm.   8. The inferior vena cava is dilated in size with >50% respiratory  variability, suggesting right atrial pressure of 8 mmHg.    Patient Profile      [name] [name] is a 63 y.o. male with a hx of HTN, obesity, depressoin, afib who is being seen 09/14/2023 for the evaluation of chest pain at the request of Dr Mikle Alexanders.   Assessment & Plan    1  CP  Pt presented with CP  Constant x 30 hours  Pleuritic  Positional    SImilar to discomfort he had in March 2025 but much worse   CT of chest shows thickened pericardium     Elevated ESR   Started Rx for pericarditis  Colchicine 0.6 bid; ibuprofen 400 bid (x 7 days)  and protonix  (given Eliquis  use)  Pt currently CP free    Ambulate   IF does OK can d/c tomorrow am   WIll make sure he has follow up       2  Atrial fibrillation   I saw the pt in March 2025 when admitted for new Afib    He never had DCCV     This would not be the right time to consider this given inflammation Keep on rate control and anticoagulation Continue metoprolol  and Eliquis     Pt should have Itamar study at home to r/o sleep apnea    WIll organize  3  CAD   coronary calcifications of CT scan  I do not think evid for active ischemia   4  Hypoxia   REported on presentation  REsolved   WIll need to be followed   5  HL  Keep on Crestor    I will make sure he has follow up in clinic and also help organize sleep study    WIll sign off for now   PLease call with questions  For questions or updates, please contact [facility] Please consult [email or web address] for contact info under        Signed, Ola Berger, MD  09/15/2023, 10:01 AM

## 2023-09-16 ENCOUNTER — Encounter (HOSPITAL_COMMUNITY): Payer: Self-pay

## 2023-09-16 ENCOUNTER — Other Ambulatory Visit (HOSPITAL_COMMUNITY): Payer: Self-pay

## 2023-09-16 DIAGNOSIS — R0902 Hypoxemia: Secondary | ICD-10-CM | POA: Diagnosis not present

## 2023-09-16 LAB — HIGH SENSITIVITY CRP: CRP, High Sensitivity: 137.51 mg/L — ABNORMAL HIGH (ref 0.00–3.00)

## 2023-09-16 MED ORDER — COLCHICINE 0.6 MG PO TABS
0.6000 mg | ORAL_TABLET | Freq: Two times a day (BID) | ORAL | 1 refills | Status: DC
  Filled 2023-09-16: qty 60, 30d supply, fill #0

## 2023-09-16 MED ORDER — METOPROLOL TARTRATE 50 MG PO TABS
50.0000 mg | ORAL_TABLET | Freq: Two times a day (BID) | ORAL | 1 refills | Status: DC
  Filled 2023-09-16: qty 60, 30d supply, fill #0

## 2023-09-16 MED ORDER — IBUPROFEN 400 MG PO TABS
400.0000 mg | ORAL_TABLET | Freq: Two times a day (BID) | ORAL | 0 refills | Status: DC
  Filled 2023-09-16: qty 14, 7d supply, fill #0

## 2023-09-16 MED ORDER — PANTOPRAZOLE SODIUM 40 MG PO TBEC
40.0000 mg | DELAYED_RELEASE_TABLET | Freq: Every day | ORAL | 0 refills | Status: DC
  Filled 2023-09-16: qty 30, 30d supply, fill #0

## 2023-09-16 NOTE — Discharge Summary (Signed)
 Physician Discharge Summary  [name] [name] JXB:[id number] DOB: [birth year] DOA: 09/12/2023  PCP: Almira Jaeger, MD  Admit date: 09/12/2023 Discharge date: 09/16/2023  Admitted From: Home Discharge disposition: Home   Recommendations for Outpatient Follow-Up:   Close cardiology follow-up for pericarditis: Started Rx for pericarditis Colchicine 0.6 bid; ibuprofen 400 bid (x 7 days) and protonix  (given Eliquis  use)  Needs sleep study   Discharge Diagnosis:   Principal Problem:   Hypoxia Active Problems:   Erythrocytosis   Chest pain    Discharge Condition: Improved.  Diet recommendation: Low sodium, heart healthy.  Carbohydrate-modified.    Wound care: None.  Code status: Full.   History of Present Illness:   63 year old gentleman with history of recent diagnosed A-fib and admitted to the hospital 2 weeks ago, recurrent precordial pain woke up in the morning with substernal pain, sharp and pleuritic in nature worse with inspiration. Recently started on anticoagulation and heart rate controlled. Plan was to follow-up as outpatient to A-fib clinic for cardioversion. In the emergency room hemodynamically stable. Chest pain improved apparently he was mobilized and found to be hypoxemic 85% on room air. Admitted.    Hospital Course by Problem:   Substernal chest pain, recurrent pleuritic chest pain: ? pericarditis Troponins and follow-up EKGs are nonischemic. CT angiogram shows heavily calcified coronaries.  Negative for PE.  Patient presenting back to the hospital with recurrent chest pain. Chest pain-free Cardiology consultation:Started Rx for pericarditis  Colchicine 0.6 bid; ibuprofen 400 bid (x 7 days)  and protonix  (given Eliquis  use)       Hypoxemia:  - On room air - home O2 study negative   A-fib with RVR: Recently diagnosed.  Today rate controlled A-fib.  Metoprolol  25 mg twice daily.  Therapeutic on Eliquis .  -needs sleepy study   Depression: On  Wellbutrin .   Essential hypertension: Blood pressure stable on metoprolol . Adjust as an outpatient   Morbid obesity:  Estimated body mass index is 43.53 kg/m as calculated from the following:   Height as of this encounter: 5\' 10"  (1.778 m).   Weight as of this encounter: 137.6 kg.         Medical Consultants:   Cardiology   Discharge Exam:   Vitals:   09/15/23 2109 09/16/23 0521  BP: (!) 154/97 (!) 154/108  Pulse: 80 78  Resp:  20  Temp:  (!) 97.4 F (36.3 C)  SpO2:  96%   Vitals:   09/15/23 1345 09/15/23 2027 09/15/23 2109 09/16/23 0521  BP: (!) 159/109 (!) 156/104 (!) 154/97 (!) 154/108  Pulse: 77 88 80 78  Resp: 18 20  20   Temp: (!) 97.3 F (36.3 C) 98.5 F (36.9 C)  (!) 97.4 F (36.3 C)  TempSrc: Oral Oral  Oral  SpO2: 97% 96%  96%  Weight:      Height:        General exam: Appears calm and comfortable.    The results of significant diagnostics from this hospitalization (including imaging, microbiology, ancillary and laboratory) are listed below for reference.     Procedures and Diagnostic Studies:   ECHOCARDIOGRAM LIMITED Result Date: 09/13/2023    ECHOCARDIOGRAM LIMITED REPORT   Patient Name:   [name] [name] Date of Exam: 09/13/2023 Medical Rec #:  [id number]      Height:       70.0 in Accession #:    [id number]     Weight:       303.4 lb Date  of Birth:  05-Dec-1960      BSA:          2.492 m Patient Age:    63 years       BP:           140/78 mmHg Patient Gender: M              HR:           89 bpm. Exam Location:  Inpatient Procedure: Limited Echo, Cardiac Doppler and Color Doppler (Both Spectral and            Color Flow Doppler were utilized during procedure). Indications:    I31.3 Pericardial effusion (noninflammatory)  History:        Patient has prior history of Echocardiogram examinations, most                 recent 07/30/2023. Abnormal ECG, Arrythmias:Atrial Fibrillation,                 Signs/Symptoms:Chest Pain; Risk Factors:Hypertension and Current                  Smoker.  Sonographer:    Raynelle Callow RDCS Referring Phys: [id number] [facility] GOEL  Sonographer Comments: Technically difficult study due to poor echo windows and patient is obese. Image acquisition challenging due to patient body habitus. IMPRESSIONS  1. Left ventricular ejection fraction, by estimation, is 60 to 65%. The left ventricle has normal function. The left ventricle has no regional wall motion abnormalities. There is mild left ventricular hypertrophy. Left ventricular diastolic parameters are indeterminate.  2. Right ventricular systolic function is mildly reduced. The right ventricular size is mildly enlarged. Tricuspid regurgitation signal is inadequate for assessing PA pressure.  3. Left atrial size was severely dilated.  4. A small pericardial effusion is present. The pericardial effusion is posterior to the left ventricle.  5. The mitral valve is abnormal. Mild mitral valve regurgitation. No evidence of mitral stenosis.  6. The aortic valve was not well visualized. There is mild calcification of the aortic valve. There is mild thickening of the aortic valve. Aortic valve regurgitation is trivial. Aortic valve sclerosis is present, with no evidence of aortic valve stenosis.  7. Aortic dilatation noted. There is mild dilatation of the ascending aorta, measuring 39 mm.  8. The inferior vena cava is dilated in size with >50% respiratory variability, suggesting right atrial pressure of 8 mmHg. FINDINGS  Left Ventricle: Left ventricular ejection fraction, by estimation, is 60 to 65%. The left ventricle has normal function. The left ventricle has no regional wall motion abnormalities. The left ventricular internal cavity size was normal in size. There is  mild left ventricular hypertrophy. Left ventricular diastolic parameters are indeterminate. Right Ventricle: The right ventricular size is mildly enlarged. No increase in right ventricular wall thickness. Right ventricular systolic function is  mildly reduced. Tricuspid regurgitation signal is inadequate for assessing PA pressure. Left Atrium: Left atrial size was severely dilated. Right Atrium: Right atrial size was normal in size. Pericardium: A small pericardial effusion is present. The pericardial effusion is posterior to the left ventricle. Mitral Valve: The mitral valve is abnormal. There is mild thickening of the mitral valve leaflet(s). There is mild calcification of the mitral valve leaflet(s). Mild mitral annular calcification. Mild mitral valve regurgitation. No evidence of mitral valve stenosis. Tricuspid Valve: The tricuspid valve is normal in structure. Tricuspid valve regurgitation is mild . No evidence of tricuspid stenosis. Aortic Valve: The aortic valve was  not well visualized. There is mild calcification of the aortic valve. There is mild thickening of the aortic valve. Aortic valve regurgitation is trivial. Aortic valve sclerosis is present, with no evidence of aortic valve stenosis. Pulmonic Valve: The pulmonic valve was normal in structure. Pulmonic valve regurgitation is not visualized. No evidence of pulmonic stenosis. Aorta: Aortic dilatation noted. There is mild dilatation of the ascending aorta, measuring 39 mm. Venous: The inferior vena cava is dilated in size with greater than 50% respiratory variability, suggesting right atrial pressure of 8 mmHg. IAS/Shunts: No atrial level shunt detected by color flow Doppler. Additional Comments: Spectral Doppler performed. Color Doppler performed.  LEFT VENTRICLE PLAX 2D LVIDd:         4.70 cm LVIDs:         3.30 cm LV PW:         1.40 cm LV IVS:        1.20 cm LVOT diam:     2.20 cm LVOT Area:     3.80 cm  LV Volumes (MOD) LV vol d, MOD A2C: 91.9 ml LV vol d, MOD A4C: 84.0 ml LV vol s, MOD A2C: 21.8 ml LV vol s, MOD A4C: 29.6 ml LV SV MOD A2C:     70.1 ml LV SV MOD A4C:     84.0 ml LV SV MOD BP:      63.1 ml IVC IVC diam: 2.45 cm LEFT ATRIUM         Index LA diam:    5.90 cm 2.37 cm/m    SHUNTS Systemic Diam: 2.20 cm Janelle Mediate MD Electronically signed by Janelle Mediate MD Signature Date/Time: 09/13/2023/12:41:04 PM    Final    CT Angio Chest/Abd/Pel for Dissection W and/or Wo Contrast Result Date: 09/12/2023 CLINICAL DATA:  Acute aortic syndrome (AAS) suspected, chest pain and nausea, atrial fibrillation EXAM: CT ANGIOGRAPHY CHEST, ABDOMEN AND PELVIS TECHNIQUE: Non-contrast CT of the chest was initially obtained. Multidetector CT imaging through the chest, abdomen and pelvis was performed using the standard protocol during bolus administration of intravenous contrast. Multiplanar reconstructed images and MIPs were obtained and reviewed to evaluate the vascular anatomy. RADIATION DOSE REDUCTION: This exam was performed according to the departmental dose-optimization program which includes automated exposure control, adjustment of the mA and/or kV according to patient size and/or use of iterative reconstruction technique. CONTRAST:  OMNIPAQUE  IOHEXOL  350 MG/ML SOLN COMPARISON:  July 29, 2023, August 03, 2020 FINDINGS: CTA CHEST FINDINGS Pulmonary Embolism: No pulmonary embolism. Cardiovascular: Moderate cardiomegaly. Dense multi-vessel coronary atherosclerosis. Small amount of pericardial effusion with apparent thickening of the pericardium and induration of the cardiophrenic fat. No aortic aneurysm, intramural hematoma, or aortic dissection. Variant 3 vessel aortic arch anatomy with the left common carotid arising from the brachiocephalic artery. Diminutive left vertebral artery arises directly from the aortic arch. Scattered calcified atherosclerosis throughout the thoracic aorta. Mediastinum/Nodes: No mediastinal mass. No mediastinal, hilar, or axillary lymphadenopathy. Lungs/Pleura: The midline trachea and bronchi are patent. No focal airspace consolidation, pleural effusion, or pneumothorax. Posterior bibasilar dependent atelectasis. Fibrolinear scarring in the lingula and left lower lobe.  Musculoskeletal: No acute fracture or destructive bone lesion. Thoracic DISH. Review of the MIP images confirms the above findings. CTA ABDOMEN AND PELVIS FINDINGS VASCULAR Aorta: No aortic aneurysm, intramural hematoma, or dissection. Scattered calcified atherosclerosis throughout the aorta. No hemodynamically significant stenosis. Celiac: Patent without acute thrombus, aneurysm, or dissection. No hemodynamically significant stenosis. SMA: Patent without acute thrombus, aneurysm, or dissection. No hemodynamically significant  stenosis. Renals: 2 right renal arteries with a single left renal artery noted. Patent without acute thrombus, aneurysm, or dissection. No hemodynamically significant stenosis. IMA: Patent without acute thrombus, aneurysm, or dissection. No hemodynamically significant stenosis. Inflow: Patent without acute thrombus, aneurysm, or dissection. No hemodynamically significant stenosis. Proximal Outflow: The bilateral common femoral and visualized portions of the superficial and profunda femoral arteries are patent without acute thrombus, aneurysm, or dissection. No hemodynamically significant stenosis. Veins: Not well evaluated due to the arterial phase of contrast. Review of the MIP images confirms the above findings. NON-VASCULAR Hepatobiliary: No mass.No radiopaque stones or wall thickening of the gallbladder.No intrahepatic or extrahepatic biliary ductal dilation. Pancreas: No mass or main ductal dilation.No peripancreatic inflammation or fluid collection. Spleen: Normal size. No mass. Adrenals/Urinary Tract: No adrenal masses. No renal mass. No hydronephrosis or nephrolithiasis. Completely decompressed with perivesicular stranding and a small left lateral bladder diverticulum. Stomach/Bowel: The stomach is decompressed without focal abnormality. No small bowel wall thickening or inflammation. No small bowel obstruction. Normal appendix. Sigmoid diverticulosis. Lymphatic: No intraabdominal or  pelvic lymphadenopathy. Reproductive: No prostatomegaly.no free pelvic fluid. Other: No pneumoperitoneum, ascites, or mesenteric inflammation. Musculoskeletal: No acute fracture or destructive lesion.Multilevel degenerative disc disease of the spine. Review of the MIP images confirms the above findings. IMPRESSION: 1. No aortic aneurysm, intramural hematoma, or aortic dissection. 2. Moderate cardiomegaly with dense multi-vessel coronary atherosclerosis. Small pericardial effusion with thickening of the pericardial sac and apparent induration of the epicardial fat, which may represent changes of pericarditis, in the correct clinical context. 3. Completely decompressed urinary bladder with perivesicular stranding and a small left lateral bladder diverticulum. Correlation with urinalysis recommended to exclude acute cystitis. 4. Sigmoid diverticulosis.  No changes of acute diverticulitis. Aortic Atherosclerosis (ICD10-I70.0). Electronically Signed   By: Rance Burrows M.D.   On: 09/12/2023 13:06   DG Chest Portable 1 View Result Date: 09/12/2023 CLINICAL DATA:  Chest pain and nausea beginning this morning. Atrial fibrillation. EXAM: PORTABLE CHEST 1 VIEW COMPARISON:  One-view chest x-ray 07/29/2023 FINDINGS: The heart is enlarged. Atherosclerotic calcifications are present at the aortic arch. Mild interstitial edema is present. Small effusions are suspected. IMPRESSION: Cardiomegaly and mild interstitial edema compatible with congestive heart failure. Electronically Signed   By: Audree Leas M.D.   On: 09/12/2023 11:44     Labs:   Basic Metabolic Panel: Recent Labs  Lab 09/12/23 1031 09/13/23 0604 09/15/23 0453  NA 137 135 137  K 4.2 4.1 3.9  CL 106 103 104  CO2 22 24 25   GLUCOSE 119* 103* 155*  BUN 12 21 19   CREATININE 0.79 1.07 0.78  CALCIUM  8.8* 8.9 8.8*   GFR Estimated Creatinine Clearance: 128.6 mL/min (by C-G formula based on SCr of 0.78 mg/dL). Liver Function Tests: Recent Labs   Lab 09/12/23 1031  AST 22  ALT 21  ALKPHOS 52  BILITOT 1.1  PROT 7.7  ALBUMIN 4.0   Recent Labs  Lab 09/12/23 1928  LIPASE 35   No results for input(s): "AMMONIA" in the last 168 hours. Coagulation profile Recent Labs  Lab 09/13/23 0604  INR 1.4*    CBC: Recent Labs  Lab 09/12/23 1031 09/13/23 0604 09/15/23 0457  WBC 9.1 8.6 5.8  HGB 17.9* 15.8 15.1  HCT 51.9 47.1 45.1  MCV 100.8* 104.9* 103.9*  PLT 219 195 203   Cardiac Enzymes: No results for input(s): "CKTOTAL", "CKMB", "CKMBINDEX", "TROPONINI" in the last 168 hours. BNP: Invalid input(s): "POCBNP" CBG: No results for input(s): "GLUCAP" in  the last 168 hours. D-Dimer No results for input(s): "DDIMER" in the last 72 hours. Hgb A1c No results for input(s): "HGBA1C" in the last 72 hours. Lipid Profile No results for input(s): "CHOL", "HDL", "LDLCALC", "TRIG", "CHOLHDL", "LDLDIRECT" in the last 72 hours. Thyroid  function studies No results for input(s): "TSH", "T4TOTAL", "T3FREE", "THYROIDAB" in the last 72 hours.  Invalid input(s): "FREET3" Anemia work up No results for input(s): "VITAMINB12", "FOLATE", "FERRITIN", "TIBC", "IRON", "RETICCTPCT" in the last 72 hours. Microbiology Recent Results (from the past 240 hours)  Resp panel by RT-PCR (RSV, Flu A&B, Covid) Anterior Nasal Swab     Status: None   Collection Time: 09/12/23 10:38 AM   Specimen: Anterior Nasal Swab  Result Value Ref Range Status   SARS Coronavirus 2 by RT PCR NEGATIVE NEGATIVE Final    Comment: (NOTE) SARS-CoV-2 target nucleic acids are NOT DETECTED.  The SARS-CoV-2 RNA is generally detectable in upper respiratory specimens during the acute phase of infection. The lowest concentration of SARS-CoV-2 viral copies this assay can detect is 138 copies/mL. A negative result does not preclude SARS-Cov-2 infection and should not be used as the sole basis for treatment or other patient management decisions. A negative result may occur with   improper specimen collection/handling, submission of specimen other than nasopharyngeal swab, presence of viral mutation(s) within the areas targeted by this assay, and inadequate number of viral copies(<138 copies/mL). A negative result must be combined with clinical observations, patient history, and epidemiological information. The expected result is Negative.  Fact Sheet for Patients:  BloggerCourse.com  Fact Sheet for Healthcare Providers:  SeriousBroker.it  This test is no t yet approved or cleared by the [place]  FDA and  has been authorized for detection and/or diagnosis of SARS-CoV-2 by FDA under an Emergency Use Authorization (EUA). This EUA will remain  in effect (meaning this test can be used) for the duration of the COVID-19 declaration under Section 564(b)(1) of the Act, 21 U.S.C.section 360bbb-3(b)(1), unless the authorization is terminated  or revoked sooner.       Influenza A by PCR NEGATIVE NEGATIVE Final   Influenza B by PCR NEGATIVE NEGATIVE Final    Comment: (NOTE) The Xpert Xpress SARS-CoV-2/FLU/RSV plus assay is intended as an aid in the diagnosis of influenza from Nasopharyngeal swab specimens and should not be used as a sole basis for treatment. Nasal washings and aspirates are unacceptable for Xpert Xpress SARS-CoV-2/FLU/RSV testing.  Fact Sheet for Patients: BloggerCourse.com  Fact Sheet for Healthcare Providers: SeriousBroker.it  This test is not yet approved or cleared by the [place]  FDA and has been authorized for detection and/or diagnosis of SARS-CoV-2 by FDA under an Emergency Use Authorization (EUA). This EUA will remain in effect (meaning this test can be used) for the duration of the COVID-19 declaration under Section 564(b)(1) of the Act, 21 U.S.C. section 360bbb-3(b)(1), unless the authorization is terminated or revoked.      Resp Syncytial Virus by PCR NEGATIVE NEGATIVE Final    Comment: (NOTE) Fact Sheet for Patients: BloggerCourse.com  Fact Sheet for Healthcare Providers: SeriousBroker.it  This test is not yet approved or cleared by the [place]  FDA and has been authorized for detection and/or diagnosis of SARS-CoV-2 by FDA under an Emergency Use Authorization (EUA). This EUA will remain in effect (meaning this test can be used) for the duration of the COVID-19 declaration under Section 564(b)(1) of the Act, 21 U.S.C. section 360bbb-3(b)(1), unless the authorization is terminated or revoked.  Performed at [facility]  [facility], [address] [place], [place] [address]      Discharge Instructions:   Discharge Instructions     Diet - low sodium heart healthy   Complete by: As directed    Increase activity slowly   Complete by: As directed       Allergies as of 09/16/2023       Reactions   Benadryl  [diphenhydramine  Hcl] Itching, Other (See Comments)   Patient stated allergy after ordered by MD   Morphine And Codeine Itching        Medication List     TAKE these medications    apixaban  5 MG Tabs tablet Commonly known as: ELIQUIS  Take 1 tablet (5 mg total) by mouth 2 (two) times daily.   buPROPion  150 MG 24 hr tablet Commonly known as: WELLBUTRIN  XL TAKE 1 TABLET(150 MG) BY MOUTH DAILY What changed:  how much to take how to take this when to take this additional instructions   colchicine 0.6 MG tablet Take 1 tablet (0.6 mg total) by mouth 2 (two) times daily.   Cosentyx  UnoReady 300 MG/2ML Soaj Generic drug: Secukinumab  Inject 300 mg into the skin every 30 (thirty) days. Starting at week 4, inject one pen monthly   ibuprofen 400 MG tablet Commonly known as: ADVIL Take 1 tablet (400 mg total) by mouth 2 (two) times daily.   metoprolol  tartrate 50 MG tablet Commonly known as: LOPRESSOR  Take 1 tablet (50  mg total) by mouth 2 (two) times daily. What changed:  medication strength how much to take   pantoprazole  40 MG tablet Commonly known as: PROTONIX  Take 1 tablet (40 mg total) by mouth daily.   rosuvastatin  5 MG tablet Commonly known as: CRESTOR  Take 1 tablet (5 mg total) by mouth daily.        Follow-up Information     Ervin Heath, [place] Follow up on 10/08/2023.   Specialties: Cardiology, Radiology Why: 8:50AM. Cardiology follow up Contact information: [address] [place] [address] [phone]         Almira Jaeger, MD Follow up in 1 week(s).   Specialty: Family Medicine Contact information: [address] [place] [address] [phone]                  Time coordinating discharge: 45 min  Signed:  Enrigue Harvard DO  [facility] 09/16/2023, 9:05 AM

## 2023-09-16 NOTE — TOC Transition Note (Signed)
 Transition of Care [facility]) - Discharge Note   Patient Details  Name: [name] [name] MRN: [id number] Date of Birth: Dec 14, 1960  [facility] CM/SW Contact:  Ruben Corolla, RN Phone Number: 09/16/2023, 9:52 AM   Clinical Narrative: d/c home no CM needs.      Final next level of care: Home/Self Care Barriers to Discharge: No Barriers Identified   Patient Goals and CMS Choice Patient states their goals for this hospitalization and ongoing recovery are:: Home CMS Medicare.gov Compare Post Acute Care list provided to:: Patient Choice offered to / list presented to : Patient [place] ownership interest in [facility].provided to:: Patient    Discharge Placement                       Discharge Plan and Services Additional resources added to the After Visit Summary for     Discharge Planning Services: CM Consult                                 Social Drivers of Health (SDOH) Interventions SDOH Screenings   Food Insecurity: No Food Insecurity (09/13/2023)  Housing: Low Risk  (09/13/2023)  Recent Concern: Housing - High Risk (07/29/2023)  Transportation Needs: No Transportation Needs (09/13/2023)  Utilities: Not At Risk (09/13/2023)  Depression (PHQ2-9): Low Risk  (03/24/2023)  Social Connections: Moderately Isolated (09/12/2023)  Tobacco Use: Medium Risk (09/12/2023)     Readmission Risk Interventions     No data to display

## 2023-09-16 NOTE — Progress Notes (Signed)
 TOC meds in a secure bag delivered to patient in room  by this RN

## 2023-09-25 ENCOUNTER — Ambulatory Visit: Payer: Self-pay | Admitting: Dermatology

## 2023-10-01 ENCOUNTER — Emergency Department (HOSPITAL_COMMUNITY)

## 2023-10-01 ENCOUNTER — Inpatient Hospital Stay (HOSPITAL_COMMUNITY)
Admission: EM | Admit: 2023-10-01 | Discharge: 2023-10-04 | DRG: 291 | Disposition: A | Discharge status: Home / Self Care | Attending: Internal Medicine | Admitting: Internal Medicine

## 2023-10-01 ENCOUNTER — Encounter (HOSPITAL_COMMUNITY): Payer: Self-pay | Admitting: Student

## 2023-10-01 ENCOUNTER — Other Ambulatory Visit: Payer: Self-pay

## 2023-10-01 DIAGNOSIS — Z823 Family history of stroke: Secondary | ICD-10-CM

## 2023-10-01 DIAGNOSIS — I110 Hypertensive heart disease with heart failure: Secondary | ICD-10-CM | POA: Diagnosis not present

## 2023-10-01 DIAGNOSIS — R0602 Shortness of breath: Secondary | ICD-10-CM | POA: Diagnosis not present

## 2023-10-01 DIAGNOSIS — F17200 Nicotine dependence, unspecified, uncomplicated: Secondary | ICD-10-CM | POA: Diagnosis present

## 2023-10-01 DIAGNOSIS — Z1152 Encounter for screening for COVID-19: Secondary | ICD-10-CM

## 2023-10-01 DIAGNOSIS — I482 Chronic atrial fibrillation, unspecified: Secondary | ICD-10-CM | POA: Diagnosis present

## 2023-10-01 DIAGNOSIS — F32A Depression, unspecified: Secondary | ICD-10-CM | POA: Diagnosis present

## 2023-10-01 DIAGNOSIS — J810 Acute pulmonary edema: Secondary | ICD-10-CM | POA: Diagnosis not present

## 2023-10-01 DIAGNOSIS — I517 Cardiomegaly: Secondary | ICD-10-CM | POA: Diagnosis not present

## 2023-10-01 DIAGNOSIS — I4891 Unspecified atrial fibrillation: Secondary | ICD-10-CM

## 2023-10-01 DIAGNOSIS — E66813 Obesity, class 3: Secondary | ICD-10-CM | POA: Diagnosis present

## 2023-10-01 DIAGNOSIS — I5033 Acute on chronic diastolic (congestive) heart failure: Secondary | ICD-10-CM | POA: Diagnosis present

## 2023-10-01 DIAGNOSIS — R0902 Hypoxemia: Secondary | ICD-10-CM | POA: Diagnosis not present

## 2023-10-01 DIAGNOSIS — Z885 Allergy status to narcotic agent status: Secondary | ICD-10-CM

## 2023-10-01 DIAGNOSIS — Z79899 Other long term (current) drug therapy: Secondary | ICD-10-CM

## 2023-10-01 DIAGNOSIS — I5031 Acute diastolic (congestive) heart failure: Secondary | ICD-10-CM | POA: Diagnosis not present

## 2023-10-01 DIAGNOSIS — K219 Gastro-esophageal reflux disease without esophagitis: Secondary | ICD-10-CM | POA: Diagnosis present

## 2023-10-01 DIAGNOSIS — Z7901 Long term (current) use of anticoagulants: Secondary | ICD-10-CM

## 2023-10-01 DIAGNOSIS — I3139 Other pericardial effusion (noninflammatory): Secondary | ICD-10-CM | POA: Diagnosis present

## 2023-10-01 DIAGNOSIS — I4510 Unspecified right bundle-branch block: Secondary | ICD-10-CM | POA: Diagnosis present

## 2023-10-01 DIAGNOSIS — R918 Other nonspecific abnormal finding of lung field: Secondary | ICD-10-CM | POA: Diagnosis not present

## 2023-10-01 DIAGNOSIS — J9811 Atelectasis: Secondary | ICD-10-CM | POA: Diagnosis present

## 2023-10-01 DIAGNOSIS — Z8249 Family history of ischemic heart disease and other diseases of the circulatory system: Secondary | ICD-10-CM

## 2023-10-01 DIAGNOSIS — E785 Hyperlipidemia, unspecified: Secondary | ICD-10-CM | POA: Diagnosis present

## 2023-10-01 DIAGNOSIS — Z6841 Body Mass Index (BMI) 40.0 and over, adult: Secondary | ICD-10-CM

## 2023-10-01 LAB — COMPREHENSIVE METABOLIC PANEL WITH GFR
ALT: 23 U/L (ref 0–44)
AST: 20 U/L (ref 15–41)
Albumin: 3.2 g/dL — ABNORMAL LOW (ref 3.5–5.0)
Alkaline Phosphatase: 69 U/L (ref 38–126)
Anion gap: 8 (ref 5–15)
BUN: 9 mg/dL (ref 8–23)
CO2: 27 mmol/L (ref 22–32)
Calcium: 8.9 mg/dL (ref 8.9–10.3)
Chloride: 100 mmol/L (ref 98–111)
Creatinine, Ser: 0.46 mg/dL — ABNORMAL LOW (ref 0.61–1.24)
GFR, Estimated: >60 mL/min (ref >60)
Glucose, Bld: 118 mg/dL — ABNORMAL HIGH (ref 70–99)
Potassium: 4.2 mmol/L (ref 3.5–5.1)
Sodium: 135 mmol/L (ref 135–145)
Total Bilirubin: 0.9 mg/dL (ref 0.0–1.2)
Total Protein: 7.5 g/dL (ref 6.5–8.1)

## 2023-10-01 LAB — CBC WITH DIFFERENTIAL/PLATELET
Abs Immature Granulocytes: 0.03 10*3/uL (ref 0.00–0.07)
Basophils Absolute: 0 10*3/uL (ref 0.0–0.1)
Basophils Relative: 1 %
Eosinophils Absolute: 0 10*3/uL (ref 0.0–0.5)
Eosinophils Relative: 1 %
HCT: 46.8 % (ref 39.0–52.0)
Hemoglobin: 15.7 g/dL (ref 13.0–17.0)
Immature Granulocytes: 1 %
Lymphocytes Relative: 19 %
Lymphs Abs: 1.2 10*3/uL (ref 0.7–4.0)
MCH: 33.7 pg (ref 26.0–34.0)
MCHC: 33.5 g/dL (ref 30.0–36.0)
MCV: 100.4 fL — ABNORMAL HIGH (ref 80.0–100.0)
Monocytes Absolute: 0.6 10*3/uL (ref 0.1–1.0)
Monocytes Relative: 10 %
Neutro Abs: 4.5 10*3/uL (ref 1.7–7.7)
Neutrophils Relative %: 68 %
Platelets: 378 10*3/uL (ref 150–400)
RBC: 4.66 MIL/uL (ref 4.22–5.81)
RDW: 12.2 % (ref 11.5–15.5)
WBC: 6.4 10*3/uL (ref 4.0–10.5)
nRBC: 0 % (ref 0.0–0.2)

## 2023-10-01 LAB — RESP PANEL BY RT-PCR (RSV, FLU A&B, COVID)  RVPGX2
Influenza A by PCR: NEGATIVE
Influenza B by PCR: NEGATIVE
Resp Syncytial Virus by PCR: NEGATIVE
SARS Coronavirus 2 by RT PCR: NEGATIVE

## 2023-10-01 LAB — PROTIME-INR
INR: 1.2 (ref 0.8–1.2)
Prothrombin Time: 15.7 s — ABNORMAL HIGH (ref 11.4–15.2)

## 2023-10-01 LAB — BRAIN NATRIURETIC PEPTIDE: B Natriuretic Peptide: 180.3 pg/mL — ABNORMAL HIGH (ref 0.0–100.0)

## 2023-10-01 LAB — TROPONIN I (HIGH SENSITIVITY)
Troponin I (High Sensitivity): 6 ng/L (ref <18)
Troponin I (High Sensitivity): 4 ng/L (ref <18)

## 2023-10-01 MED ORDER — PANTOPRAZOLE SODIUM 40 MG PO TBEC
40.0000 mg | DELAYED_RELEASE_TABLET | Freq: Every day | ORAL | Status: DC
  Administered 2023-10-01 – 2023-10-04 (×4): 40 mg via ORAL
  Filled 2023-10-01 (×4): qty 1

## 2023-10-01 MED ORDER — SODIUM CHLORIDE 0.9 % IV SOLN: 250.0000 mL | INTRAVENOUS | Status: AC | PRN

## 2023-10-01 MED ORDER — METOPROLOL TARTRATE 50 MG PO TABS
50.0000 mg | ORAL_TABLET | Freq: Two times a day (BID) | ORAL | Status: DC
  Administered 2023-10-01 – 2023-10-04 (×6): 50 mg via ORAL
  Filled 2023-10-01 (×6): qty 1

## 2023-10-01 MED ORDER — APIXABAN 5 MG PO TABS
5.0000 mg | ORAL_TABLET | Freq: Two times a day (BID) | ORAL | Status: DC
  Administered 2023-10-01 – 2023-10-04 (×6): 5 mg via ORAL
  Filled 2023-10-01 (×6): qty 1

## 2023-10-01 MED ORDER — FUROSEMIDE 10 MG/ML IJ SOLN
20.0000 mg | Freq: Once | INTRAMUSCULAR | Status: AC
  Administered 2023-10-01: 20 mg via INTRAVENOUS
  Filled 2023-10-01: qty 4

## 2023-10-01 MED ORDER — SODIUM CHLORIDE 0.9% FLUSH
3.0000 mL | Freq: Two times a day (BID) | INTRAVENOUS | Status: DC
  Administered 2023-10-01 – 2023-10-04 (×6): 3 mL via INTRAVENOUS

## 2023-10-01 MED ORDER — BUPROPION HCL ER (XL) 150 MG PO TB24
150.0000 mg | ORAL_TABLET | Freq: Every day | ORAL | Status: DC
  Administered 2023-10-01 – 2023-10-03 (×3): 150 mg via ORAL
  Filled 2023-10-01 (×3): qty 1

## 2023-10-01 MED ORDER — FUROSEMIDE 10 MG/ML IJ SOLN
20.0000 mg | Freq: Two times a day (BID) | INTRAMUSCULAR | Status: DC
  Administered 2023-10-01 – 2023-10-02 (×2): 20 mg via INTRAVENOUS
  Filled 2023-10-01 (×2): qty 2

## 2023-10-01 MED ORDER — LISINOPRIL 5 MG PO TABS
5.0000 mg | ORAL_TABLET | Freq: Every day | ORAL | Status: DC
  Administered 2023-10-01 – 2023-10-04 (×4): 5 mg via ORAL
  Filled 2023-10-01 (×4): qty 1

## 2023-10-01 MED ORDER — SODIUM CHLORIDE 0.9% FLUSH: 3.0000 mL | INTRAVENOUS | Status: DC | PRN

## 2023-10-01 MED ORDER — ACETAMINOPHEN 325 MG PO TABS: 650.0000 mg | ORAL_TABLET | ORAL | Status: DC | PRN

## 2023-10-01 NOTE — ED Provider Notes (Signed)
 Emergency Department Provider Note   I have reviewed the triage vital signs and the nursing notes.   HISTORY  Chief Complaint Shortness of Breath and Atrial Fibrillation   HPI [name] [name] is a 63 y.o. male with PMH of A fib on anticoagulation presents to the ED with acute onset SOB. Patient was asleep in bed when he awoke, gasping for air. His SOB has continued this AM. Prior to this AM, however, he notes some increasing exertional dyspnea. No fever. No CP. No abdominal or back pain. Admit earlier this month for CP/A fib. Has follow up with A fib clinic later this month but has not scheduled a sleep study.    Past Medical History:  Diagnosis Date   Allergy    Boil 12/28/2021   Offered to lance, he declined Will use doxy for impetigo and he will use warm compresses.   Diverticulitis    leading to approx 1 foot resection-colectomy   History of kidney stones    Hx of adenomatous colonic polyps 10/19/2017   Hypertension    Impetigo any site 12/28/2021   Intertrigo 12/28/2021   NEPHROLITHIASIS, HX OF 03/09/2007   PONV (postoperative nausea and vomiting)    SPRAIN/STRAIN, ANKLE NOS 01/05/2007   Sweaty armpits 12/28/2021    Review of Systems  Constitutional: No fever/chills Cardiovascular: Denies chest pain. Respiratory: Positive shortness of breath. Gastrointestinal: No abdominal pain.  No nausea, no vomiting.  Skin: Negative for rash. Neurological: Negative for headaches.  ____________________________________________   PHYSICAL EXAM:  VITAL SIGNS: ED Triage Vitals  Encounter Vitals Group     BP 10/01/23 1130 (!) 178/105     Pulse Rate 10/01/23 1130 (!) 103     Resp 10/01/23 1130 18     Temp 10/01/23 1130 98 F (36.7 C)     Temp Source 10/01/23 1130 Oral     SpO2 10/01/23 1130 (!) 89 %     Weight 10/01/23 1139 (!) 302 lb 0.5 oz (137 kg)     Height 10/01/23 1139 5\' 10"  (1.778 m)   Constitutional: Alert and oriented. Well appearing and in no acute  distress. Eyes: Conjunctivae are normal.  Head: Atraumatic. Nose: No congestion/rhinnorhea. Mouth/Throat: Mucous membranes are moist.   Neck: No stridor.   Cardiovascular: Normal rate, regular rhythm. Good peripheral circulation. Grossly normal heart sounds.   Respiratory: Normal respiratory effort.  No retractions. Lungs CTAB. Gastrointestinal: Soft and nontender. No distention.  Musculoskeletal: No lower extremity tenderness nor edema. No gross deformities of extremities. Neurologic:  Normal speech and language. No gross focal neurologic deficits are appreciated.  Skin:  Skin is warm, dry and intact. No rash noted.  ____________________________________________   LABS (all labs ordered are listed, but only abnormal results are displayed)  Labs Reviewed  COMPREHENSIVE METABOLIC PANEL WITH GFR - Abnormal; Notable for the following components:      Result Value   Glucose, Bld 118 (*)    Creatinine, Ser 0.46 (*)    Albumin 3.2 (*)    All other components within normal limits  BRAIN NATRIURETIC PEPTIDE - Abnormal; Notable for the following components:   B Natriuretic Peptide 180.3 (*)    All other components within normal limits  CBC WITH DIFFERENTIAL/PLATELET - Abnormal; Notable for the following components:   MCV 100.4 (*)    All other components within normal limits  PROTIME-INR - Abnormal; Notable for the following components:   Prothrombin Time 15.7 (*)    All other components within normal limits  BASIC METABOLIC PANEL WITH GFR - Abnormal; Notable for the following components:   Sodium 133 (*)    Glucose, Bld 109 (*)    Calcium  8.3 (*)    All other components within normal limits  CBC - Abnormal; Notable for the following components:   MCV 100.7 (*)    All other components within normal limits  RESP PANEL BY RT-PCR (RSV, FLU A&B, COVID)  [id number]  TROPONIN I (HIGH SENSITIVITY)  TROPONIN I (HIGH SENSITIVITY)   ____________________________________________  EKG   EKG  Interpretation Date/Time:  Wednesday Oct 01 2023 11:41:17 EDT Ventricular Rate:  97 PR Interval:    QRS Duration:  130 QT Interval:  382 QTC Calculation: 486 R Axis:   71  Text Interpretation: Atrial fibrillation Right bundle branch block Abnrm T, consider ischemia, anterolateral lds Baseline wander in lead(s) I II III aVL aVF V2 V4 V5 V6 Confirmed by Abby Hocking [id number]) on 10/01/2023 11:48:08 AM        ____________________________________________  RADIOLOGY  DG Chest 2 View Result Date: 10/01/2023 CLINICAL DATA:  Shortness of breath. EXAM: CHEST - 2 VIEW COMPARISON:  Chest radiograph dated 09/12/2023. FINDINGS: Small left pleural effusion and left lung base atelectasis or infiltrate. No pneumothorax. Stable cardiomegaly. No acute osseous pathology. IMPRESSION: Small left pleural effusion and left lung base atelectasis or infiltrate. Electronically Signed   By: Angus Bark M.D.   On: 10/01/2023 15:21    ____________________________________________   PROCEDURES  Procedure(s) performed:   Procedures  None ____________________________________________   INITIAL IMPRESSION / ASSESSMENT AND PLAN / ED COURSE  Pertinent labs & imaging results that were available during my care of the patient were reviewed by me and considered in my medical decision making (see chart for details).   This patient is Presenting for Evaluation of SOB, which does require a range of treatment options, and is a complaint that involves a high risk of morbidity and mortality.  The Differential Diagnoses include CHF, PE, ACS, symptomatic A fib, etc.  Critical Interventions-    Medications  apixaban  (ELIQUIS ) tablet 5 mg (5 mg Oral Given 10/02/23 0856)  metoprolol  tartrate (LOPRESSOR ) tablet 50 mg (50 mg Oral Given 10/02/23 0856)  buPROPion  (WELLBUTRIN  XL) 24 hr tablet 150 mg (150 mg Oral Given 10/01/23 2102)  pantoprazole  (PROTONIX ) EC tablet 40 mg (40 mg Oral Given 10/02/23 0856)  sodium chloride   flush (NS) 0.9 % injection 3 mL (3 mLs Intravenous Given 10/02/23 0859)  sodium chloride  flush (NS) 0.9 % injection 3 mL (has no administration in time range)  0.9 %  sodium chloride  infusion (has no administration in time range)  acetaminophen  (TYLENOL ) tablet 650 mg (has no administration in time range)  lisinopril (ZESTRIL) tablet 5 mg (5 mg Oral Given 10/02/23 0856)  furosemide (LASIX) injection 40 mg (40 mg Intravenous Given 10/02/23 0857)  glucagon (human recombinant) (GLUCAGEN) injection 1 mg (has no administration in time range)  hydrALAZINE (APRESOLINE) injection 10 mg (has no administration in time range)  ipratropium-albuterol  (DUONEB) 0.5-2.5 (3) MG/3ML nebulizer solution 3 mL (has no administration in time range)  metoprolol  tartrate (LOPRESSOR ) injection 5 mg (has no administration in time range)  senna-docusate (Senokot-S) tablet 1 tablet (has no administration in time range)  traZODone  (DESYREL ) tablet 50 mg (has no administration in time range)  ondansetron  (ZOFRAN ) injection 4 mg (has no administration in time range)  furosemide (LASIX) injection 20 mg (20 mg Intravenous Given 10/01/23 1624)  potassium chloride  SA (KLOR-CON  M) CR tablet 40 mEq (40  mEq Oral Given 10/02/23 0856)    Reassessment after intervention:  symptoms improved.   I decided to review pertinent External Data, and in summary patient with admit earlier this month. Normal EF on 5/3 limited ECHO is 60-65%.    Clinical Laboratory Tests Ordered, included mildly elevated BNP at 180.  Troponin normal.  No acute kidney injury.  Radiologic Tests Ordered, included CXR. I independently interpreted the images and agree with radiology interpretation.   Cardiac Monitor Tracing which shows NSR.    Social Determinants of Health Risk patient is an occasional smoker.   Consult complete with [facility]. Plan for admit.   Medical Decision Making: Summary:  Patient presents emergency department with shortness of breath this  morning with dyspnea over the past several days.  Arrives in A-fib with borderline saturations. On my evaluation appears comfortable, 3% on room air. A fib but rate-controlled.   Reevaluation with update and discussion with patient.  He is sitting upright with improved symptoms in this position.  Reports if he lies back he becomes very short of breath.  No O2 requirement at rest.  Plan for IV Lasix and admit in this setting.   Patient's presentation is most consistent with acute presentation with potential threat to life or bodily function.   Disposition: admit  ____________________________________________  FINAL CLINICAL IMPRESSION(S) / ED DIAGNOSES  Final diagnoses:  Acute pulmonary edema (HCC)    Note:  This document was prepared using Dragon voice recognition software and may include unintentional dictation errors.  Abby Hocking, MD, [facility]    Camreigh Michie, Shereen Dike, MD 10/02/23 1059

## 2023-10-01 NOTE — ED Notes (Signed)
 Patient will not keep vital machine cords connected

## 2023-10-01 NOTE — ED Triage Notes (Signed)
 Pt arrived reporting shob that started last night. Recent diagnosed with Afib, started on medication and has been complaint. Appt established with cardiologist, had not seen him yet. Reports ago fatigue, been sleeping a lot. Denies cp at this time. Patient diaphoretic and tachypnea noted in triage

## 2023-10-01 NOTE — Hospital Course (Addendum)
 Brief Narrative:   63 year old with history of HTN, A-fib on Eliquis , HLD, GERD, depression, obesity comes to the ER with complaints of orthopnea.  Recently diagnosed with A-fib requiring hospitalization from 5/2 - 5/6.  At that time there was also concerns of pericarditis therefore discharged on colchicine , ibuprofen , PPI.  Was also started on metoprolol  and Eliquis .  Started having orthopnea about 4 days prior to admission.  During the hospitalization patient was diuresed with IV Lasix and slowly his respiratory symptoms started improving.  Eventually transition to p.o.  Doing much better today.  Ambulating without any shortness of breath or hypoxia.  Will need to follow-up outpatient  Assessment & Plan:  Principal Problem:   Acute diastolic CHF (congestive heart failure) (HCC)     Acute diastolic HF exacerbation, EF 60% -Recent echocardiogram shows EF of 60%, severely dilated LA and small pericardial effusion.  BNP mildly elevated.  Today appears to be euvolemic therefore we will transition IV Lasix to p.o. daily along with potassium supplements upon discharge.  Will need to follow-up patient PCP in 1 week and get repeat blood work done - Metoprolol , lisinopril added  Atrial fibrillation, chronic -Recently diagnosed.  Now on Lopressor  and Eliquis .  Due to pericarditis his cardioversion was deferred to a later time  Pericarditis - Recent concerns for pericarditis.  Completed 7-day course of colchicine , ibuprofen    Essential hypertension -Currently on Coreg, lisinopril and Lasix added   Hyperlipidemia Rosuvastatin    GERD - Resume home PPI and continue at least while on Eliquis    Depression - Resume home Wellbutrin  150 mg nightly   Class III obesity - Encouraged lifestyle modifications to promote weight loss Would benefit from outpatient sleep study  DVT prophylaxis: apixaban  (ELIQUIS ) tablet 5 mg      Code Status: Full Code Family Communication:   Discharge  Subjective: No  complaints, no evidence of hypoxia or shortness of breath with ambulation.  Appears to be euvolemic.  Examination:  General exam: Appears calm and comfortable  Respiratory system: Bibasilar crackles, improved Cardiovascular system: S1 & S2 heard, RRR. No JVD, murmurs, rubs, gallops or clicks. No pedal edema. Gastrointestinal system: Abdomen is nondistended, soft and nontender. No organomegaly or masses felt. Normal bowel sounds heard. Central nervous system: Alert and oriented. No focal neurological deficits. Extremities: Symmetric 5 x 5 power. Skin: No rashes, lesions or ulcers Psychiatry: Judgement and insight appear normal. Mood & affect appropriate.

## 2023-10-01 NOTE — ED Notes (Signed)
 Patient transported to X-ray

## 2023-10-01 NOTE — H&P (Addendum)
 History and Physical    Patient: [name] [name] YNW:[id number] DOB: 1961/03/03 DOA: 10/01/2023 DOS: the patient was seen and examined on 10/01/2023 PCP: Almira Jaeger, MD  Patient coming from: Home  Chief Complaint:  Chief Complaint  Patient presents with   Shortness of Breath   Atrial Fibrillation   HPI: AZZAM [name] is a 63 y.o. male with medical history significant of hypertension, atrial fibrillation on Eliquis , hyperlipidemia, GERD, depression, obesity presenting to the ED with orthopnea.  Patient reports that he was recently diagnosed with atrial fibrillation a few weeks ago and required hospitalization for this.  After hospital discharge, he has been doing well at home until about 4 to 5 days ago.  He began noticing shortness of breath with exertion and leg swelling.  Over the past couple of days, he has also been feeling short of breath when laying flat.  This morning, he was awakened from sleep gasping for air.  This prompted him to come to the ED for further evaluation and care.  He denies any fevers, chills, nausea, vomiting, chest pain, palpitations, cough, abdominal pain, urinary changes.  He has been adherent with his home Eliquis .  ED course: Vital signs notable for mild tachycardia, elevated blood pressure, saturating well on room air.  CBC unremarkable.  CMP with glucose 118, normal kidney function, albumin 3.2.  BNP 180.  Respiratory panel negative for COVID, flu, RSV.  Troponins 6 > 4.  Chest x-ray with a small left pleural effusion with associated mild atelectasis.  Patient given dose of IV Lasix 20 mg in the ED.  TR[facility] asked to evaluate patient for admission.  Review of Systems: As mentioned in the history of present illness. All other systems reviewed and are negative. Past Medical History:  Diagnosis Date   Allergy    Boil 12/28/2021   Offered to lance, he declined Will use doxy for impetigo and he will use warm compresses.   Diverticulitis    leading to approx 1  foot resection-colectomy   History of kidney stones    Hx of adenomatous colonic polyps 10/19/2017   Hypertension    Impetigo any site 12/28/2021   Intertrigo 12/28/2021   NEPHROLITHIASIS, HX OF 03/09/2007   PONV (postoperative nausea and vomiting)    SPRAIN/STRAIN, ANKLE NOS 01/05/2007   Sweaty armpits 12/28/2021   Past Surgical History:  Procedure Laterality Date   COLECTOMY  09/2008   Dr Alray Askew   COLONOSCOPY     HERNIA REPAIR  09/18/12   lap incisional hernia repair, related to colectomy   VENTRAL HERNIA REPAIR N/A 09/18/2012   Social History:  reports that he quit smoking about 7 years ago. His smoking use included cigarettes and e-cigarettes. He has never used smokeless tobacco. He reports current alcohol use. He reports that he does not currently use drugs.  Allergies  Allergen Reactions   Benadryl  [Diphenhydramine  Hcl] Itching and Other (See Comments)    Patient stated allergy after ordered by MD   Morphine And Codeine Itching    Family History  Problem Relation Age of Onset   CVA Mother        88   Heart attack Father        74   Sleep apnea Father        noncompliant with CPAP   Pulmonary Hypertension Father        related to cpap noncompliance. died 90 sepsis    Colon cancer Neg Hx    Colon polyps Neg Hx  Esophageal cancer Neg Hx    Rectal cancer Neg Hx    Stomach cancer Neg Hx     Prior to Admission medications   Medication Sig Start Date End Date Taking? Authorizing Provider  apixaban  (ELIQUIS ) 5 MG TABS tablet Take 1 tablet (5 mg total) by mouth 2 (two) times daily. 07/31/23   Barbee Lew, MD  buPROPion  (WELLBUTRIN  XL) 150 MG 24 hr tablet TAKE 1 TABLET(150 MG) BY MOUTH DAILY Patient taking differently: Take 150 mg by mouth at bedtime. 06/11/23   Almira Jaeger, MD  colchicine  0.6 MG tablet Take 1 tablet (0.6 mg total) by mouth 2 (two) times daily. 09/16/23   Vann, Jessica U, DO  ibuprofen  (ADVIL ) 400 MG tablet Take 1 tablet (400 mg total) by  mouth 2 (two) times daily. 09/16/23   Vann, Jessica U, DO  metoprolol  tartrate (LOPRESSOR ) 50 MG tablet Take 1 tablet (50 mg total) by mouth 2 (two) times daily. 09/16/23   Vann, Jessica U, DO  pantoprazole  (PROTONIX ) 40 MG tablet Take 1 tablet (40 mg total) by mouth daily. 09/16/23   Vann, Jessica U, DO  rosuvastatin  (CRESTOR ) 5 MG tablet Take 1 tablet (5 mg total) by mouth daily. Patient not taking: Reported on 09/13/2023 07/31/23   Barbee Lew, MD  Secukinumab  (COSENTYX  UNOREADY) 300 MG/2ML SOAJ Inject 300 mg into the skin every 30 (thirty) days. Starting at week 4, inject one pen monthly 05/28/23   Deneise Finlay, MD    Physical Exam: Vitals:   10/01/23 1130 10/01/23 1139 10/01/23 1324 10/01/23 1530  BP: (!) 178/105  (!) 172/120 (!) 153/101  Pulse: (!) 103  98 88  Resp: 18   18  Temp: 98 F (36.7 C)   98.2 F (36.8 C)  TempSrc: Oral     SpO2: (!) 89%  95% 97%  Weight:  (!) 137 kg    Height:  5\' 10"  (1.778 m)     Physical Exam Constitutional:      Appearance: Normal appearance. He is obese. He is not ill-appearing.  HENT:     Head: Normocephalic and atraumatic.     Mouth/Throat:     Mouth: Mucous membranes are moist.     Pharynx: Oropharynx is clear. No oropharyngeal exudate.  Eyes:     General: No scleral icterus.    Extraocular Movements: Extraocular movements intact.     Conjunctiva/sclera: Conjunctivae normal.     Pupils: Pupils are equal, round, and reactive to light.  Cardiovascular:     Rate and Rhythm: Normal rate. Rhythm irregular.     Heart sounds: Normal heart sounds. No murmur heard.    No friction rub. No gallop.  Pulmonary:     Effort: Pulmonary effort is normal. No respiratory distress.     Breath sounds: No wheezing or rhonchi.     Comments: Mild crackles noted at left lung base. Saturating >92% on RA. Abdominal:     General: Bowel sounds are normal. There is no distension.     Palpations: Abdomen is soft.     Tenderness: There is no abdominal  tenderness. There is no guarding or rebound.  Musculoskeletal:        General: Normal range of motion.     Cervical back: Normal range of motion.     Comments: 1+ pitting edema in bilateral LE up to mid-shins.  Skin:    General: Skin is warm and dry.  Neurological:     General: No focal deficit present.  Mental Status: He is alert and oriented to person, place, and time.  Psychiatric:        Mood and Affect: Mood normal.        Behavior: Behavior normal.     Data Reviewed:  There are no new results to review at this time.     Latest Ref Rng & Units 10/01/2023   12:16 PM 09/15/2023    4:57 AM 09/13/2023    6:04 AM  CBC  WBC 4.0 - 10.5 K/uL 6.4  5.8  8.6   Hemoglobin 13.0 - 17.0 g/dL 10.9  60.4  54.0   Hematocrit 39.0 - 52.0 % 46.8  45.1  47.1   Platelets 150 - 400 K/uL 378  203  195       Latest Ref Rng & Units 10/01/2023   12:16 PM 09/15/2023    4:53 AM 09/13/2023    6:04 AM  CMP  Glucose 70 - 99 mg/dL 981  191  478   BUN 8 - 23 mg/dL 9  19  21    Creatinine 0.61 - 1.24 mg/dL 2.95  6.21  3.08   Sodium 135 - 145 mmol/L 135  137  135   Potassium 3.5 - 5.1 mmol/L 4.2  3.9  4.1   Chloride 98 - 111 mmol/L 100  104  103   CO2 22 - 32 mmol/L 27  25  24    Calcium  8.9 - 10.3 mg/dL 8.9  8.8  8.9   Total Protein 6.5 - 8.1 g/dL 7.5     Total Bilirubin 0.0 - 1.2 mg/dL 0.9     Alkaline Phos 38 - 126 U/L 69     AST 15 - 41 U/L 20     ALT 0 - 44 U/L 23      BNP    Component Value Date/Time   BNP 180.3 (H) 10/01/2023 1216    DG Chest 2 View Result Date: 10/01/2023 CLINICAL DATA:  Shortness of breath. EXAM: CHEST - 2 VIEW COMPARISON:  Chest radiograph dated 09/12/2023. FINDINGS: Small left pleural effusion and left lung base atelectasis or infiltrate. No pneumothorax. Stable cardiomegaly. No acute osseous pathology. IMPRESSION: Small left pleural effusion and left lung base atelectasis or infiltrate. Electronically Signed   By: Angus Bark M.D.   On: 10/01/2023 15:21      Assessment and Plan: No notes have been filed under this hospital service. Service: Hospitalist  Acute diastolic HF exacerbation Patient presenting with leg edema, orthopnea, dyspnea with exertion and found to have an elevated BNP and chest x-ray showing a small left pleural effusion.  Last echocardiogram on 5/3 showing LVEF 60 to 65%, normal LV function, no regional wall motion abnormalities, mild LVH, indeterminate LV diastolic parameters, severely dilated LA, small pericardial effusion.  Patient's presentation is consistent with acute diastolic heart failure exacerbation, likely induced by tachyarrhythmia given known Afib.  He is not on any diuretic at home, given a dose of IV Lasix 20 mg in the ED.  In terms of GDMT, he is on Lopressor  50 mg twice daily at home and has been adherent with this.  Will continue on admission.  Will also start low-dose lisinopril for further GDMT given elevated BP.  He is currently saturating well on room air. - IV Lasix 20 mg twice daily - Resume home metoprolol  tartrate 50 mg twice daily - Start lisinopril 5 mg daily - Will avoid obtaining repeat echocardiogram given last echo 3 weeks ago - Telemetry - Strict I/O's/daily weights -  Supplemental oxygen as needed to maintain O2 sats greater than 92% - Place in observation - PT eval  Atrial fibrillation Patient diagnosed with atrial fibrillation during recent hospitalization 3 weeks ago.  He has been adherent with his home Eliquis  and Lopressor  50 mg twice daily.  He is currently in atrial fibrillation per his EKG but remains rate controlled. -Resume home Eliquis  5 mg twice daily - Resume home Lopressor  - Telemetry  Hypertension Patient with blood pressures ranging in the 150s-170s/100s-120s.  Will resume his home Lopressor  50 mg twice daily and will start lisinopril 5 mg daily today. -Resume home Lopressor  50 mg twice daily - Start lisinopril 5 mg daily - Trend BP curve  Hyperlipidemia - Patient  reports not taking home Crestor  5 mg daily  GERD - Resume home PPI  Depression - Resume home Wellbutrin  150 mg nightly  Class III obesity - Encouraged lifestyle modifications to promote weight loss   Advance Care Planning:   Code Status: Full Code   Consults: none  Family Communication: no family at bedside  Severity of Illness: The appropriate patient status for this patient is OBSERVATION. Observation status is judged to be reasonable and necessary in order to provide the required intensity of service to ensure the patient's safety. The patient's presenting symptoms, physical exam findings, and initial radiographic and laboratory data in the context of their medical condition is felt to place them at decreased risk for further clinical deterioration. Furthermore, it is anticipated that the patient will be medically stable for discharge from the hospital within 2 midnights of admission.   Portions of this note were generated with Scientist, clinical (histocompatibility and immunogenetics). Dictation errors may occur despite best attempts at proofreading.  Author: Raghad Lorenz H Kaliel Bolds, MD 10/01/2023 5:04 PM  For on call review [email or web address].

## 2023-10-02 DIAGNOSIS — J9811 Atelectasis: Secondary | ICD-10-CM | POA: Diagnosis not present

## 2023-10-02 DIAGNOSIS — I5033 Acute on chronic diastolic (congestive) heart failure: Secondary | ICD-10-CM | POA: Diagnosis not present

## 2023-10-02 DIAGNOSIS — K219 Gastro-esophageal reflux disease without esophagitis: Secondary | ICD-10-CM | POA: Diagnosis not present

## 2023-10-02 DIAGNOSIS — I5031 Acute diastolic (congestive) heart failure: Secondary | ICD-10-CM | POA: Diagnosis not present

## 2023-10-02 DIAGNOSIS — J810 Acute pulmonary edema: Secondary | ICD-10-CM | POA: Diagnosis present

## 2023-10-02 DIAGNOSIS — F32A Depression, unspecified: Secondary | ICD-10-CM | POA: Diagnosis not present

## 2023-10-02 DIAGNOSIS — I3139 Other pericardial effusion (noninflammatory): Secondary | ICD-10-CM | POA: Diagnosis not present

## 2023-10-02 DIAGNOSIS — Z8249 Family history of ischemic heart disease and other diseases of the circulatory system: Secondary | ICD-10-CM | POA: Diagnosis not present

## 2023-10-02 DIAGNOSIS — Z1152 Encounter for screening for COVID-19: Secondary | ICD-10-CM | POA: Diagnosis not present

## 2023-10-02 DIAGNOSIS — Z823 Family history of stroke: Secondary | ICD-10-CM | POA: Diagnosis not present

## 2023-10-02 DIAGNOSIS — I482 Chronic atrial fibrillation, unspecified: Secondary | ICD-10-CM | POA: Diagnosis not present

## 2023-10-02 DIAGNOSIS — Z7901 Long term (current) use of anticoagulants: Secondary | ICD-10-CM | POA: Diagnosis not present

## 2023-10-02 DIAGNOSIS — E66813 Obesity, class 3: Secondary | ICD-10-CM | POA: Diagnosis not present

## 2023-10-02 DIAGNOSIS — Z885 Allergy status to narcotic agent status: Secondary | ICD-10-CM | POA: Diagnosis not present

## 2023-10-02 DIAGNOSIS — Z79899 Other long term (current) drug therapy: Secondary | ICD-10-CM | POA: Diagnosis not present

## 2023-10-02 DIAGNOSIS — F17200 Nicotine dependence, unspecified, uncomplicated: Secondary | ICD-10-CM | POA: Diagnosis not present

## 2023-10-02 DIAGNOSIS — I4510 Unspecified right bundle-branch block: Secondary | ICD-10-CM | POA: Diagnosis not present

## 2023-10-02 DIAGNOSIS — I110 Hypertensive heart disease with heart failure: Secondary | ICD-10-CM | POA: Diagnosis not present

## 2023-10-02 DIAGNOSIS — Z6841 Body Mass Index (BMI) 40.0 and over, adult: Secondary | ICD-10-CM | POA: Diagnosis not present

## 2023-10-02 DIAGNOSIS — E785 Hyperlipidemia, unspecified: Secondary | ICD-10-CM | POA: Diagnosis not present

## 2023-10-02 LAB — BASIC METABOLIC PANEL WITH GFR
Anion gap: 9 (ref 5–15)
BUN: 9 mg/dL (ref 8–23)
CO2: 26 mmol/L (ref 22–32)
Calcium: 8.3 mg/dL — ABNORMAL LOW (ref 8.9–10.3)
Chloride: 98 mmol/L (ref 98–111)
Creatinine, Ser: 0.7 mg/dL (ref 0.61–1.24)
GFR, Estimated: >60 mL/min (ref >60)
Glucose, Bld: 109 mg/dL — ABNORMAL HIGH (ref 70–99)
Potassium: 3.5 mmol/L (ref 3.5–5.1)
Sodium: 133 mmol/L — ABNORMAL LOW (ref 135–145)

## 2023-10-02 LAB — CBC
HCT: 46.1 % (ref 39.0–52.0)
Hemoglobin: 15.3 g/dL (ref 13.0–17.0)
MCH: 33.4 pg (ref 26.0–34.0)
MCHC: 33.2 g/dL (ref 30.0–36.0)
MCV: 100.7 fL — ABNORMAL HIGH (ref 80.0–100.0)
Platelets: 395 10*3/uL (ref 150–400)
RBC: 4.58 MIL/uL (ref 4.22–5.81)
RDW: 12.1 % (ref 11.5–15.5)
WBC: 6.4 10*3/uL (ref 4.0–10.5)
nRBC: 0 % (ref 0.0–0.2)

## 2023-10-02 MED ORDER — TRAZODONE HCL 50 MG PO TABS
50.0000 mg | ORAL_TABLET | Freq: Every evening | ORAL | Status: DC | PRN
  Administered 2023-10-03: 50 mg via ORAL
  Filled 2023-10-02: qty 1

## 2023-10-02 MED ORDER — POTASSIUM CHLORIDE CRYS ER 20 MEQ PO TBCR
40.0000 meq | EXTENDED_RELEASE_TABLET | Freq: Once | ORAL | Status: AC
  Administered 2023-10-02: 40 meq via ORAL
  Filled 2023-10-02: qty 2

## 2023-10-02 MED ORDER — ONDANSETRON HCL 4 MG/2ML IJ SOLN: 4.0000 mg | Freq: Four times a day (QID) | INTRAMUSCULAR | Status: DC | PRN

## 2023-10-02 MED ORDER — HYDRALAZINE HCL 20 MG/ML IJ SOLN: 10.0000 mg | INTRAMUSCULAR | Status: DC | PRN

## 2023-10-02 MED ORDER — IPRATROPIUM-ALBUTEROL 0.5-2.5 (3) MG/3ML IN SOLN: 3.0000 mL | RESPIRATORY_TRACT | Status: DC | PRN

## 2023-10-02 MED ORDER — SENNOSIDES-DOCUSATE SODIUM 8.6-50 MG PO TABS: 1.0000 | ORAL_TABLET | Freq: Every evening | ORAL | Status: DC | PRN

## 2023-10-02 MED ORDER — METOPROLOL TARTRATE 5 MG/5ML IV SOLN: 5.0000 mg | INTRAVENOUS | Status: DC | PRN

## 2023-10-02 MED ORDER — FUROSEMIDE 10 MG/ML IJ SOLN
40.0000 mg | Freq: Two times a day (BID) | INTRAMUSCULAR | Status: DC
  Administered 2023-10-02 – 2023-10-04 (×5): 40 mg via INTRAVENOUS
  Filled 2023-10-02 (×5): qty 4

## 2023-10-02 MED ORDER — GLUCAGON HCL RDNA (DIAGNOSTIC) 1 MG IJ SOLR: 1.0000 mg | INTRAMUSCULAR | Status: DC | PRN

## 2023-10-02 MED ORDER — FUROSEMIDE 10 MG/ML IJ SOLN: 40.0000 mg | Freq: Two times a day (BID) | INTRAMUSCULAR | Status: DC

## 2023-10-02 NOTE — Care Management Obs Status (Signed)
 MEDICARE OBSERVATION STATUS NOTIFICATION   Patient Details  Name: [name] [name] MRN: [id number] Date of Birth: September 09, 1960   Medicare Observation Status Notification Given:  Yes    MahabirThersia Flax, RN 10/02/2023, 11:18 AM

## 2023-10-02 NOTE — Progress Notes (Signed)
 PROGRESS NOTE    [name] [name]  ZOX:[id number] DOB: November 03, 1960 DOA: 10/01/2023 PCP: Almira Jaeger, MD    Brief Narrative:   63 year old with history of HTN, A-fib on Eliquis , HLD, GERD, depression, obesity comes to the ER with complaints of orthopnea.  Recently diagnosed with A-fib requiring hospitalization from 5/2 - 5/6.  At that time there was also concerns of pericarditis therefore discharged on colchicine , ibuprofen , PPI.  Was also started on metoprolol  and Eliquis .  Started having orthopnea about 4 days prior to admission.  Assessment & Plan:  Principal Problem:   Acute diastolic CHF (congestive heart failure) (HCC)     Acute diastolic HF exacerbation, EF 60% -Recent echocardiogram shows EF of 60%, severely dilated LA and small pericardial effusion.  BNP mildly elevated. - Started on Lasix IV twice daily - On metoprolol  50 mg twice daily, lisinopril 5 mg daily - Currently between room air and 2 L nasal cannula, will obtain ambulatory pulse ox as we diurese him.   Atrial fibrillation, chronic -Recently diagnosed.  Now on Lopressor  and Eliquis .  Due to pericarditis his cardioversion was deferred to a later time  Pericarditis - Recent concerns for pericarditis.  Completed 7-day course of colchicine , ibuprofen    Essential hypertension -Currently on Coreg, lisinopril added IV as needed   Hyperlipidemia Rosuvastatin    GERD - Resume home PPI and continue at least while on Eliquis    Depression - Resume home Wellbutrin  150 mg nightly   Class III obesity - Encouraged lifestyle modifications to promote weight loss  DVT prophylaxis: apixaban  (ELIQUIS ) tablet 5 mg      Code Status: Full Code Family Communication:   Continue hospital stay for ongoing diuresis   Subjective: Still having exertional dyspnea   Examination:  General exam: Appears calm and comfortable  Respiratory system: Bibasilar crackles Cardiovascular system: S1 & S2 heard, RRR. No JVD, murmurs,  rubs, gallops or clicks. No pedal edema. Gastrointestinal system: Abdomen is nondistended, soft and nontender. No organomegaly or masses felt. Normal bowel sounds heard. Central nervous system: Alert and oriented. No focal neurological deficits. Extremities: Symmetric 5 x 5 power. Skin: No rashes, lesions or ulcers Psychiatry: Judgement and insight appear normal. Mood & affect appropriate.                Diet Orders (From admission, onward)     Start     Ordered   10/01/23 1703  Diet Heart Room service appropriate? Yes; Fluid consistency: Thin  Diet effective now       Question Answer Comment  Room service appropriate? Yes   Fluid consistency: Thin      10/01/23 1704            Objective: Vitals:   10/02/23 0539 10/02/23 0624 10/02/23 0640 10/02/23 0856  BP: (!) 158/96  (!) 137/100 (!) 137/100  Pulse: 89 88 83 83  Resp:      Temp: (!) 97.5 F (36.4 C)     TempSrc: Oral     SpO2: 92% 97%    Weight: 132.1 kg     Height:        Intake/Output Summary (Last 24 hours) at 10/02/2023 1106 Last data filed at 10/02/2023 0900 Gross per 24 hour  Intake 240 ml  Output 500 ml  Net -260 ml   Filed Weights   10/01/23 1139 10/02/23 0539  Weight: (!) 137 kg 132.1 kg    Scheduled Meds:  apixaban   5 mg Oral BID   buPROPion   150 mg  Oral QHS   furosemide  40 mg Intravenous BID   lisinopril  5 mg Oral Daily   metoprolol  tartrate  50 mg Oral BID   pantoprazole   40 mg Oral Daily   sodium chloride  flush  3 mL Intravenous Q12H   Continuous Infusions:  sodium chloride       Nutritional status     Body mass index is 41.78 kg/m.  Data Reviewed:   CBC: Recent Labs  Lab 10/01/23 1216 10/02/23 0822  WBC 6.4 6.4  NEUTROABS 4.5  --   HGB 15.7 15.3  HCT 46.8 46.1  MCV 100.4* 100.7*  PLT 378 395   Basic Metabolic Panel: Recent Labs  Lab 10/01/23 1216 10/02/23 0519  NA 135 133*  K 4.2 3.5  CL 100 98  CO2 27 26  GLUCOSE 118* 109*  BUN 9 9  CREATININE  0.46* 0.70  CALCIUM  8.9 8.3*   GFR: Estimated Creatinine Clearance: 125.8 mL/min (by C-G formula based on SCr of 0.7 mg/dL). Liver Function Tests: Recent Labs  Lab 10/01/23 1216  AST 20  ALT 23  ALKPHOS 69  BILITOT 0.9  PROT 7.5  ALBUMIN 3.2*   No results for input(s): "LIPASE", "AMYLASE" in the last 168 hours. No results for input(s): "AMMONIA" in the last 168 hours. Coagulation Profile: Recent Labs  Lab 10/01/23 1216  INR 1.2   Cardiac Enzymes: No results for input(s): "CKTOTAL", "CKMB", "CKMBINDEX", "TROPONINI" in the last 168 hours. BNP (last 3 results) No results for input(s): "PROBNP" in the last 8760 hours. HbA1C: No results for input(s): "HGBA1C" in the last 72 hours. CBG: No results for input(s): "GLUCAP" in the last 168 hours. Lipid Profile: No results for input(s): "CHOL", "HDL", "LDLCALC", "TRIG", "CHOLHDL", "LDLDIRECT" in the last 72 hours. Thyroid  Function Tests: No results for input(s): "TSH", "T4TOTAL", "FREET4", "T3FREE", "THYROIDAB" in the last 72 hours. Anemia Panel: No results for input(s): "VITAMINB12", "FOLATE", "FERRITIN", "TIBC", "IRON", "RETICCTPCT" in the last 72 hours. Sepsis Labs: No results for input(s): "PROCALCITON", "LATICACIDVEN" in the last 168 hours.  Recent Results (from the past 240 hours)  Resp panel by RT-PCR (RSV, Flu A&B, Covid) Anterior Nasal Swab     Status: None   Collection Time: 10/01/23 12:16 PM   Specimen: Anterior Nasal Swab  Result Value Ref Range Status   SARS Coronavirus 2 by RT PCR NEGATIVE NEGATIVE Final    Comment: (NOTE) SARS-CoV-2 target nucleic acids are NOT DETECTED.  The SARS-CoV-2 RNA is generally detectable in upper respiratory specimens during the acute phase of infection. The lowest concentration of SARS-CoV-2 viral copies this assay can detect is 138 copies/mL. A negative result does not preclude SARS-Cov-2 infection and should not be used as the sole basis for treatment or other patient management  decisions. A negative result may occur with  improper specimen collection/handling, submission of specimen other than nasopharyngeal swab, presence of viral mutation(s) within the areas targeted by this assay, and inadequate number of viral copies(<138 copies/mL). A negative result must be combined with clinical observations, patient history, and epidemiological information. The expected result is Negative.  Fact Sheet for Patients:  BloggerCourse.com  Fact Sheet for Healthcare Providers:  SeriousBroker.it  This test is no t yet approved or cleared by the [place]  FDA and  has been authorized for detection and/or diagnosis of SARS-CoV-2 by FDA under an Emergency Use Authorization (EUA). This EUA will remain  in effect (meaning this test can be used) for the duration of the COVID-19 declaration under Section 564(b)(1)  of the Act, 21 U.S.C.section 360bbb-3(b)(1), unless the authorization is terminated  or revoked sooner.       Influenza A by PCR NEGATIVE NEGATIVE Final   Influenza B by PCR NEGATIVE NEGATIVE Final    Comment: (NOTE) The Xpert Xpress SARS-CoV-2/FLU/RSV plus assay is intended as an aid in the diagnosis of influenza from Nasopharyngeal swab specimens and should not be used as a sole basis for treatment. Nasal washings and aspirates are unacceptable for Xpert Xpress SARS-CoV-2/FLU/RSV testing.  Fact Sheet for Patients: BloggerCourse.com  Fact Sheet for Healthcare Providers: SeriousBroker.it  This test is not yet approved or cleared by the [place]  FDA and has been authorized for detection and/or diagnosis of SARS-CoV-2 by FDA under an Emergency Use Authorization (EUA). This EUA will remain in effect (meaning this test can be used) for the duration of the COVID-19 declaration under Section 564(b)(1) of the Act, 21 U.S.C. section 360bbb-3(b)(1), unless the  authorization is terminated or revoked.     Resp Syncytial Virus by PCR NEGATIVE NEGATIVE Final    Comment: (NOTE) Fact Sheet for Patients: BloggerCourse.com  Fact Sheet for Healthcare Providers: SeriousBroker.it  This test is not yet approved or cleared by the [place]  FDA and has been authorized for detection and/or diagnosis of SARS-CoV-2 by FDA under an Emergency Use Authorization (EUA). This EUA will remain in effect (meaning this test can be used) for the duration of the COVID-19 declaration under Section 564(b)(1) of the Act, 21 U.S.C. section 360bbb-3(b)(1), unless the authorization is terminated or revoked.  Performed at [facility], [address] [place], [place] [address]          Radiology Studies: DG Chest 2 View Result Date: 10/01/2023 CLINICAL DATA:  Shortness of breath. EXAM: CHEST - 2 VIEW COMPARISON:  Chest radiograph dated 09/12/2023. FINDINGS: Small left pleural effusion and left lung base atelectasis or infiltrate. No pneumothorax. Stable cardiomegaly. No acute osseous pathology. IMPRESSION: Small left pleural effusion and left lung base atelectasis or infiltrate. Electronically Signed   By: Angus Bark M.D.   On: 10/01/2023 15:21           LOS: 0 days   Time spent= 35 mins    Maggie Schooner, MD [facility]  If 7PM-7AM, please contact night-coverage  10/02/2023, 11:06 AM

## 2023-10-02 NOTE — Evaluation (Signed)
 Physical Therapy Evaluation Patient Details Name: [name] [name] MRN: [id number] DOB: 01/27/1961 Today's Date: 10/02/2023  History of Present Illness  [name] [name] is a 63 y.o. male admitted 10/01/23 with medical history significant of hypertension, atrial fibrillation on Eliquis , hyperlipidemia, GERD, depression, obesity presenting to the ED with orthopnea.  Clinical Impression  Pt admitted with above diagnosis. Pt in bed when PT arrives and agreeable to session. He states that he lives alone and has flight of stairs going to basement with laundry there. He is able to complete all tasks at ind level today. VSS throughout, c/c decreased endurance. Symptoms seem to be consistent with underlying medical complications currently being managed and I expect a good return to PLOF with med team management. At this time, pt is functioning safely and effectively and would benefit from mobility referral during admission. Please consult dept should further needs arise.       If plan is discharge home, recommend the following:     Can travel by private vehicle        Equipment Recommendations None recommended by PT  Recommendations for Other Services       Functional Status Assessment Patient has had a recent decline in their functional status and demonstrates the ability to make significant improvements in function in a reasonable and predictable amount of time.     Precautions / Restrictions Precautions Precautions: Fall (low fall risk) Recall of Precautions/Restrictions: Intact Restrictions Weight Bearing Restrictions Per Provider Order: No      Mobility  Bed Mobility Overal bed mobility: Independent                  Transfers Overall transfer level: Independent                      Ambulation/Gait Ambulation/Gait assistance: Modified independent (Device/Increase time) Gait Distance (Feet): 120 Feet Assistive device: None Gait Pattern/deviations: Step-through  pattern, Wide base of support Gait velocity: dec     General Gait Details: Pt able to amb in the hallway at mod I, decreased cadence/velocity and wide BOS, functional for pt and he does not demonstrate any LOB  Stairs Stairs: Yes Stairs assistance: Modified independent (Device/Increase time) Stair Management: Two rails, Alternating pattern Number of Stairs: 10 General stair comments: pt tolerates stairs well using bil HR per home setup and reciprocal negotiation up and down  Wheelchair Mobility     Tilt Bed    Modified Rankin (Stroke Patients Only)       Balance Overall balance assessment: Independent                                           Pertinent Vitals/Pain Pain Assessment Pain Assessment: No/denies pain    Home Living Family/patient expects to be discharged to:: Private residence Living Arrangements: Alone   Type of Home: House Home Access: Level entry     Alternate Level Stairs-Number of Steps: 12 Home Layout: Two level;Laundry or work area in [employer]: None      Prior Function Prior Level of Function : Independent/Modified Independent                     Extremity/Trunk Assessment   Upper Extremity Assessment Upper Extremity Assessment: Overall WFL for tasks assessed    Lower Extremity Assessment Lower Extremity Assessment: Overall WFL for tasks assessed  Cervical / Trunk Assessment Cervical / Trunk Assessment: Normal  Communication   Communication Communication: No apparent difficulties    Cognition Arousal: Alert Behavior During Therapy: WFL for tasks assessed/performed   PT - Cognitive impairments: No apparent impairments                         Following commands: Intact       Cueing Cueing Techniques: Verbal cues, Gestural cues     General Comments General comments (skin integrity, edema, etc.): pt due for pedicure before appt, unable to make this appt. No LE edema present  at time of this consult    Exercises     Assessment/Plan    PT Assessment Patient does not need any further PT services  PT Problem List         PT Treatment Interventions      PT Goals (Current goals can be found in the Care Plan section)  Acute Rehab PT Goals Patient Stated Goal: figure out what is causing hospitalizations and correct to avoid constant readmissions PT Goal Formulation: With patient Time For Goal Achievement: 10/16/23 Potential to Achieve Goals: Good    Frequency       Co-evaluation               AM-PAC PT "6 Clicks" Mobility  Outcome Measure Help needed turning from your back to your side while in a flat bed without using bedrails?: None Help needed moving from lying on your back to sitting on the side of a flat bed without using bedrails?: None Help needed moving to and from a bed to a chair (including a wheelchair)?: None Help needed standing up from a chair using your arms (e.g., wheelchair or bedside chair)?: None Help needed to walk in hospital room?: None Help needed climbing 3-5 steps with a railing? : None 6 Click Score: 24    End of Session Equipment Utilized During Treatment: Gait belt Activity Tolerance: Patient tolerated treatment well Patient left: in bed;with call bell/phone within reach Nurse Communication: Mobility status PT Visit Diagnosis: Difficulty in walking, not elsewhere classified (R26.2)    Time: 1610-9604 PT Time Calculation (min) (ACUTE ONLY): 18 min   Charges:   PT Evaluation $PT Eval Low Complexity: 1 Low   PT General Charges $$ ACUTE PT VISIT: 1 Visit         Darien Eden PT Acute Rehabilitation Services Office: [phone] 10/02/2023   Serafin Dames 10/02/2023, 1:42 PM

## 2023-10-02 NOTE — Progress Notes (Signed)
 Patient suddenly had a SOB. Place him on oxygen at 2lpm. Oxygen saturation is 97%. NP made aware.

## 2023-10-02 NOTE — TOC Initial Note (Signed)
 Transition of Care [facility]) - Initial/Assessment Note    Patient Details  Name: [name] [name] MRN: [id number] Date of Birth: Dec 10, 1960  Transition of Care [facility]) CM/SW Contact:    Ruben Corolla, RN Phone Number: 10/02/2023, 11:21 AM  Clinical Narrative: d/c plan home. Has own transport.                  Expected Discharge Plan: Home/Self Care Barriers to Discharge: Continued Medical Work up   Patient Goals and CMS Choice   CMS Medicare.gov Compare Post Acute Care list provided to:: Patient Choice offered to / list presented to : Patient [place] ownership interest in [facility].provided to:: Patient    Expected Discharge Plan and Services                                              Prior Living Arrangements/Services                       Activities of Daily Living   ADL Screening (condition at time of admission) Independently performs ADLs?: Yes (appropriate for developmental age) Is the patient deaf or have difficulty hearing?: No Does the patient have difficulty seeing, even when wearing glasses/contacts?: No Does the patient have difficulty concentrating, remembering, or making decisions?: No  Permission Sought/Granted                  Emotional Assessment              Admission diagnosis:  Acute pulmonary edema (HCC) [J81.0] Acute diastolic CHF (congestive heart failure) (HCC) [I50.31] Patient Active Problem List   Diagnosis Date Noted   Acute diastolic CHF (congestive heart failure) (HCC) 10/01/2023   Hypoxia 09/12/2023   Erythrocytosis 09/12/2023   Chest pain 09/12/2023   Atrial fibrillation with rapid ventricular response (HCC) 07/29/2023   Impetigo any site 12/28/2021   Intertrigo 12/28/2021   Boil 12/28/2021   Sweaty armpits 12/28/2021   Depression, major, single episode, moderate (HCC) 12/21/2019   Rhinitis, chronic 01/23/2018   ETD (Eustachian tube dysfunction), bilateral 01/23/2018   Bilateral impacted  cerumen 01/23/2018   Morbid obesity (HCC) 11/06/2017   Hx of adenomatous colonic polyps 10/19/2017   Diverticulitis    Stress fracture 07/04/2014   Right-sided thoracic back pain 11/28/2013   HTN (hypertension) 05/09/2012   PCP:  Almira Jaeger, MD Pharmacy:   [facility] [id number] [address], [place] - [address] [place] [address] Phone: [phone] Fax: [phone]  [place] [facility] [address] [address] Phone: [phone] Fax: [phone]     Social Drivers of Health (SDOH) Social History: SDOH Screenings   Food Insecurity: No Food Insecurity (10/01/2023)  Housing: Low Risk  (10/01/2023)  Recent Concern: Housing - High Risk (07/29/2023)  Transportation Needs: No Transportation Needs (10/01/2023)  Utilities: Not At Risk (10/01/2023)  Depression (PHQ2-9): Low Risk  (03/24/2023)  Social Connections: Moderately Isolated (10/01/2023)  Tobacco Use: Medium Risk (10/01/2023)   SDOH Interventions:     Readmission Risk Interventions     No data to display

## 2023-10-02 NOTE — Progress Notes (Signed)
 Heart Failure Navigator Progress Note  Assessed for Heart & Vascular TOC clinic readiness.   Patient with EF 60-65%. Has a scheduled appt with [facility] on 5/28. Will not schedule additional HF TOC appt at this time.    Navigator available for reassessment of patient.   Jerilyn Monte, PharmD, BCPS Heart Failure Stewardship Pharmacist Phone [phone]

## 2023-10-03 DIAGNOSIS — I5031 Acute diastolic (congestive) heart failure: Secondary | ICD-10-CM | POA: Diagnosis not present

## 2023-10-03 LAB — BASIC METABOLIC PANEL WITH GFR
Anion gap: 10 (ref 5–15)
BUN: 16 mg/dL (ref 8–23)
CO2: 27 mmol/L (ref 22–32)
Calcium: 8.7 mg/dL — ABNORMAL LOW (ref 8.9–10.3)
Chloride: 99 mmol/L (ref 98–111)
Creatinine, Ser: 0.77 mg/dL (ref 0.61–1.24)
GFR, Estimated: >60 mL/min (ref >60)
Glucose, Bld: 99 mg/dL (ref 70–99)
Potassium: 3.5 mmol/L (ref 3.5–5.1)
Sodium: 136 mmol/L (ref 135–145)

## 2023-10-03 LAB — MAGNESIUM: Magnesium: 2 mg/dL (ref 1.7–2.4)

## 2023-10-03 MED ORDER — POTASSIUM CHLORIDE CRYS ER 20 MEQ PO TBCR
40.0000 meq | EXTENDED_RELEASE_TABLET | Freq: Once | ORAL | Status: AC
  Administered 2023-10-03: 40 meq via ORAL
  Filled 2023-10-03: qty 2

## 2023-10-03 NOTE — Progress Notes (Signed)
 PROGRESS NOTE    [name] [name]  ZOX:[id number] DOB: [birth year] DOA: 10/01/2023 PCP: Almira Jaeger, MD    Brief Narrative:   63 year old with history of HTN, A-fib on Eliquis , HLD, GERD, depression, obesity comes to the ER with complaints of orthopnea.  Recently diagnosed with A-fib requiring hospitalization from 5/2 - 5/6.  At that time there was also concerns of pericarditis therefore discharged on colchicine , ibuprofen , PPI.  Was also started on metoprolol  and Eliquis .  Started having orthopnea about 4 days prior to admission.  Assessment & Plan:  Principal Problem:   Acute diastolic CHF (congestive heart failure) (HCC)     Acute diastolic HF exacerbation, EF 60% -Recent echocardiogram shows EF of 60%, severely dilated LA and small pericardial effusion.  BNP mildly elevated. - Started on Lasix IV twice daily - On metoprolol  50 mg twice daily, lisinopril 5 mg daily - Currently between room air and 2 L nasal cannula, will obtain ambulatory pulse ox as we diurese him.   Atrial fibrillation, chronic -Recently diagnosed.  Now on Lopressor  and Eliquis .  Due to pericarditis his cardioversion was deferred to a later time  Pericarditis - Recent concerns for pericarditis.  Completed 7-day course of colchicine , ibuprofen    Essential hypertension -Currently on Coreg, lisinopril added IV as needed   Hyperlipidemia Rosuvastatin    GERD - Resume home PPI and continue at least while on Eliquis    Depression - Resume home Wellbutrin  150 mg nightly   Class III obesity - Encouraged lifestyle modifications to promote weight loss  DVT prophylaxis: apixaban  (ELIQUIS ) tablet 5 mg      Code Status: Full Code Family Communication:   Continue hospital stay for ongoing diuresis   Subjective: Still having exertional dyspnea Ambulated without hypoxia but did get short of breath to the end of his walk  Examination:  General exam: Appears calm and comfortable  Respiratory system:  Bibasilar crackles Cardiovascular system: S1 & S2 heard, RRR. No JVD, murmurs, rubs, gallops or clicks. No pedal edema. Gastrointestinal system: Abdomen is nondistended, soft and nontender. No organomegaly or masses felt. Normal bowel sounds heard. Central nervous system: Alert and oriented. No focal neurological deficits. Extremities: Symmetric 5 x 5 power. Skin: No rashes, lesions or ulcers Psychiatry: Judgement and insight appear normal. Mood & affect appropriate.                Diet Orders (From admission, onward)     Start     Ordered   10/01/23 1703  Diet Heart Room service appropriate? Yes; Fluid consistency: Thin  Diet effective now       Question Answer Comment  Room service appropriate? Yes   Fluid consistency: Thin      10/01/23 1704            Objective: Vitals:   10/02/23 1252 10/02/23 2027 10/03/23 0450 10/03/23 0500  BP: (!) 145/101 112/67 119/87   Pulse: 90 (!) 106 80   Resp: 20 18 18    Temp: 98.2 F (36.8 C) 98.2 F (36.8 C) 97.7 F (36.5 C)   TempSrc: Oral Oral Oral   SpO2: 93% 93% 92%   Weight:    128.3 kg  Height:        Intake/Output Summary (Last 24 hours) at 10/03/2023 1052 Last data filed at 10/03/2023 1039 Gross per 24 hour  Intake 720 ml  Output 1450 ml  Net -730 ml   Filed Weights   10/01/23 1139 10/02/23 0539 10/03/23 0500  Weight: (!) 137 kg  132.1 kg 128.3 kg    Scheduled Meds:  apixaban   5 mg Oral BID   buPROPion   150 mg Oral QHS   furosemide  40 mg Intravenous BID   lisinopril  5 mg Oral Daily   metoprolol  tartrate  50 mg Oral BID   pantoprazole   40 mg Oral Daily   sodium chloride  flush  3 mL Intravenous Q12H   Continuous Infusions:  Nutritional status     Body mass index is 40.58 kg/m.  Data Reviewed:   CBC: Recent Labs  Lab 10/01/23 1216 10/02/23 0822  WBC 6.4 6.4  NEUTROABS 4.5  --   HGB 15.7 15.3  HCT 46.8 46.1  MCV 100.4* 100.7*  PLT 378 395   Basic Metabolic Panel: Recent Labs  Lab  10/01/23 1216 10/02/23 0519 10/03/23 0445  NA 135 133* 136  K 4.2 3.5 3.5  CL 100 98 99  CO2 27 26 27   GLUCOSE 118* 109* 99  BUN 9 9 16   CREATININE 0.46* 0.70 0.77  CALCIUM  8.9 8.3* 8.7*  MG  --   --  2.0   GFR: Estimated Creatinine Clearance: 123.8 mL/min (by C-G formula based on SCr of 0.77 mg/dL). Liver Function Tests: Recent Labs  Lab 10/01/23 1216  AST 20  ALT 23  ALKPHOS 69  BILITOT 0.9  PROT 7.5  ALBUMIN 3.2*   No results for input(s): "LIPASE", "AMYLASE" in the last 168 hours. No results for input(s): "AMMONIA" in the last 168 hours. Coagulation Profile: Recent Labs  Lab 10/01/23 1216  INR 1.2   Cardiac Enzymes: No results for input(s): "CKTOTAL", "CKMB", "CKMBINDEX", "TROPONINI" in the last 168 hours. BNP (last 3 results) No results for input(s): "PROBNP" in the last 8760 hours. HbA1C: No results for input(s): "HGBA1C" in the last 72 hours. CBG: No results for input(s): "GLUCAP" in the last 168 hours. Lipid Profile: No results for input(s): "CHOL", "HDL", "LDLCALC", "TRIG", "CHOLHDL", "LDLDIRECT" in the last 72 hours. Thyroid  Function Tests: No results for input(s): "TSH", "T4TOTAL", "FREET4", "T3FREE", "THYROIDAB" in the last 72 hours. Anemia Panel: No results for input(s): "VITAMINB12", "FOLATE", "FERRITIN", "TIBC", "IRON", "RETICCTPCT" in the last 72 hours. Sepsis Labs: No results for input(s): "PROCALCITON", "LATICACIDVEN" in the last 168 hours.  Recent Results (from the past 240 hours)  Resp panel by RT-PCR (RSV, Flu A&B, Covid) Anterior Nasal Swab     Status: None   Collection Time: 10/01/23 12:16 PM   Specimen: Anterior Nasal Swab  Result Value Ref Range Status   SARS Coronavirus 2 by RT PCR NEGATIVE NEGATIVE Final    Comment: (NOTE) SARS-CoV-2 target nucleic acids are NOT DETECTED.  The SARS-CoV-2 RNA is generally detectable in upper respiratory specimens during the acute phase of infection. The lowest concentration of SARS-CoV-2 viral  copies this assay can detect is 138 copies/mL. A negative result does not preclude SARS-Cov-2 infection and should not be used as the sole basis for treatment or other patient management decisions. A negative result may occur with  improper specimen collection/handling, submission of specimen other than nasopharyngeal swab, presence of viral mutation(s) within the areas targeted by this assay, and inadequate number of viral copies(<138 copies/mL). A negative result must be combined with clinical observations, patient history, and epidemiological information. The expected result is Negative.  Fact Sheet for Patients:  BloggerCourse.com  Fact Sheet for Healthcare Providers:  SeriousBroker.it  This test is no t yet approved or cleared by the [place]  FDA and  has been authorized for detection and/or  diagnosis of SARS-CoV-2 by FDA under an Emergency Use Authorization (EUA). This EUA will remain  in effect (meaning this test can be used) for the duration of the COVID-19 declaration under Section 564(b)(1) of the Act, 21 U.S.C.section 360bbb-3(b)(1), unless the authorization is terminated  or revoked sooner.       Influenza A by PCR NEGATIVE NEGATIVE Final   Influenza B by PCR NEGATIVE NEGATIVE Final    Comment: (NOTE) The Xpert Xpress SARS-CoV-2/FLU/RSV plus assay is intended as an aid in the diagnosis of influenza from Nasopharyngeal swab specimens and should not be used as a sole basis for treatment. Nasal washings and aspirates are unacceptable for Xpert Xpress SARS-CoV-2/FLU/RSV testing.  Fact Sheet for Patients: BloggerCourse.com  Fact Sheet for Healthcare Providers: SeriousBroker.it  This test is not yet approved or cleared by the [place]  FDA and has been authorized for detection and/or diagnosis of SARS-CoV-2 by FDA under an Emergency Use Authorization (EUA). This  EUA will remain in effect (meaning this test can be used) for the duration of the COVID-19 declaration under Section 564(b)(1) of the Act, 21 U.S.C. section 360bbb-3(b)(1), unless the authorization is terminated or revoked.     Resp Syncytial Virus by PCR NEGATIVE NEGATIVE Final    Comment: (NOTE) Fact Sheet for Patients: BloggerCourse.com  Fact Sheet for Healthcare Providers: SeriousBroker.it  This test is not yet approved or cleared by the [place]  FDA and has been authorized for detection and/or diagnosis of SARS-CoV-2 by FDA under an Emergency Use Authorization (EUA). This EUA will remain in effect (meaning this test can be used) for the duration of the COVID-19 declaration under Section 564(b)(1) of the Act, 21 U.S.C. section 360bbb-3(b)(1), unless the authorization is terminated or revoked.  Performed at [facility], [address], [place], [place] [address]          Radiology Studies: DG Chest 2 View Result Date: 10/01/2023 CLINICAL DATA:  Shortness of breath. EXAM: CHEST - 2 VIEW COMPARISON:  Chest radiograph dated 09/12/2023. FINDINGS: Small left pleural effusion and left lung base atelectasis or infiltrate. No pneumothorax. Stable cardiomegaly. No acute osseous pathology. IMPRESSION: Small left pleural effusion and left lung base atelectasis or infiltrate. Electronically Signed   By: Angus Bark M.D.   On: 10/01/2023 15:21           LOS: 1 day   Time spent= 35 mins    Maggie Schooner, MD [facility]  If 7PM-7AM, please contact night-coverage  10/03/2023, 10:52 AM

## 2023-10-03 NOTE — Progress Notes (Signed)
 Report received from Ananias Karma RN. No changes in Patient's assessment at this time. Maintain current plan of care

## 2023-10-03 NOTE — Progress Notes (Signed)
 SATURATION QUALIFICATIONS: (This note is used to comply with regulatory documentation for home oxygen)  Patient Saturations on Room Air at Rest = 93%  Patient Saturations on Room Air while Ambulating = 94%  No DOE while ambulating but patient did feel short of breath when returned to bed.

## 2023-10-04 DIAGNOSIS — I5031 Acute diastolic (congestive) heart failure: Secondary | ICD-10-CM | POA: Diagnosis not present

## 2023-10-04 MED ORDER — LISINOPRIL 5 MG PO TABS: 5.0000 mg | ORAL_TABLET | Freq: Every day | ORAL | 0 refills | Status: DC

## 2023-10-04 MED ORDER — POTASSIUM CHLORIDE CRYS ER 20 MEQ PO TBCR: 20.0000 meq | EXTENDED_RELEASE_TABLET | Freq: Every day | ORAL | 0 refills | Status: AC

## 2023-10-04 MED ORDER — FUROSEMIDE 40 MG PO TABS: 40.0000 mg | ORAL_TABLET | Freq: Every day | ORAL | 0 refills | Status: DC

## 2023-10-04 NOTE — Discharge Summary (Signed)
 Physician Discharge Summary  [name] [name] NWG:[id number] DOB: 14-Jan-1961 DOA: 10/01/2023  PCP: Almira Jaeger, MD  Admit date: 10/01/2023 Discharge date: 10/04/2023  Admitted From: Home Disposition: Home  Recommendations for Outpatient Follow-up:  Follow up with PCP in 1-2 weeks Please obtain BMP/CBC in one week your next doctors visit.  Continue metoprolol .  Lisinopril, Lasix and potassium supplement prescribed Needs to check daily weight.  Follow-up patient with PCP and cardiology Advised to stay compliant with his fluid intake Recommend outpatient sleep study   Discharge Condition: Stable CODE STATUS: Full code Diet recommendation: Heart healthy  Brief/Interim Summary: Brief Narrative:   63 year old with history of HTN, A-fib on Eliquis , HLD, GERD, depression, obesity comes to the ER with complaints of orthopnea.  Recently diagnosed with A-fib requiring hospitalization from 5/2 - 5/6.  At that time there was also concerns of pericarditis therefore discharged on colchicine , ibuprofen , PPI.  Was also started on metoprolol  and Eliquis .  Started having orthopnea about 4 days prior to admission.  During the hospitalization patient was diuresed with IV Lasix and slowly his respiratory symptoms started improving.  Eventually transition to p.o.  Doing much better today.  Ambulating without any shortness of breath or hypoxia.  Will need to follow-up outpatient  Assessment & Plan:  Principal Problem:   Acute diastolic CHF (congestive heart failure) (HCC)     Acute diastolic HF exacerbation, EF 60% -Recent echocardiogram shows EF of 60%, severely dilated LA and small pericardial effusion.  BNP mildly elevated.  Today appears to be euvolemic therefore we will transition IV Lasix to p.o. daily along with potassium supplements upon discharge.  Will need to follow-up patient PCP in 1 week and get repeat blood work done - Metoprolol , lisinopril added  Atrial fibrillation, chronic -Recently  diagnosed.  Now on Lopressor  and Eliquis .  Due to pericarditis his cardioversion was deferred to a later time  Pericarditis - Recent concerns for pericarditis.  Completed 7-day course of colchicine , ibuprofen    Essential hypertension -Currently on Coreg, lisinopril and Lasix added   Hyperlipidemia Rosuvastatin    GERD - Resume home PPI and continue at least while on Eliquis    Depression - Resume home Wellbutrin  150 mg nightly   Class III obesity - Encouraged lifestyle modifications to promote weight loss Would benefit from outpatient sleep study  DVT prophylaxis: apixaban  (ELIQUIS ) tablet 5 mg      Code Status: Full Code Family Communication:   Discharge  Subjective: No complaints, no evidence of hypoxia or shortness of breath with ambulation.  Appears to be euvolemic.  Examination:  General exam: Appears calm and comfortable  Respiratory system: Bibasilar crackles, improved Cardiovascular system: S1 & S2 heard, RRR. No JVD, murmurs, rubs, gallops or clicks. No pedal edema. Gastrointestinal system: Abdomen is nondistended, soft and nontender. No organomegaly or masses felt. Normal bowel sounds heard. Central nervous system: Alert and oriented. No focal neurological deficits. Extremities: Symmetric 5 x 5 power. Skin: No rashes, lesions or ulcers Psychiatry: Judgement and insight appear normal. Mood & affect appropriate.    Discharge Diagnoses:  Principal Problem:   Acute diastolic CHF (congestive heart failure) (HCC)      Discharge Exam: Vitals:   10/03/23 2123 10/04/23 0459  BP: 120/86 116/73  Pulse: 94 63  Resp: 20 18  Temp: 98.1 F (36.7 C) 97.7 F (36.5 C)  SpO2: 94% 93%   Vitals:   10/03/23 1220 10/03/23 2123 10/04/23 0459 10/04/23 0800  BP: (!) 148/99 120/86 116/73   Pulse: 85 94 63  Resp: 20 20 18    Temp: 98.5 F (36.9 C) 98.1 F (36.7 C) 97.7 F (36.5 C)   TempSrc: Oral Oral Oral   SpO2: 94% 94% 93%   Weight:    129.7 kg  Height:           Discharge Instructions   Allergies as of 10/04/2023       Reactions   Benadryl  [diphenhydramine  Hcl] Itching, Other (See Comments)   Patient stated allergy after ordered by MD   Morphine And Codeine Itching        Medication List     STOP taking these medications    colchicine  0.6 MG tablet   pantoprazole  40 MG tablet Commonly known as: PROTONIX        TAKE these medications    amLODipine  10 MG tablet Commonly known as: NORVASC  Take 10 mg by mouth daily.   apixaban  5 MG Tabs tablet Commonly known as: ELIQUIS  Take 1 tablet (5 mg total) by mouth 2 (two) times daily.   buPROPion  150 MG 24 hr tablet Commonly known as: WELLBUTRIN  XL TAKE 1 TABLET(150 MG) BY MOUTH DAILY   Cosentyx  UnoReady 300 MG/2ML Soaj Generic drug: Secukinumab  Inject 300 mg into the skin every 30 (thirty) days. Starting at week 4, inject one pen monthly   furosemide 40 MG tablet Commonly known as: Lasix Take 1 tablet (40 mg total) by mouth daily.   lisinopril 5 MG tablet Commonly known as: ZESTRIL Take 1 tablet (5 mg total) by mouth daily. Start taking on: Oct 05, 2023   metoprolol  tartrate 50 MG tablet Commonly known as: LOPRESSOR  Take 1 tablet (50 mg total) by mouth 2 (two) times daily.   potassium chloride  SA 20 MEQ tablet Commonly known as: KLOR-CON  M Take 1 tablet (20 mEq total) by mouth daily.   rosuvastatin  5 MG tablet Commonly known as: CRESTOR  Take 1 tablet (5 mg total) by mouth daily.   TYLENOL  500 MG tablet Generic drug: acetaminophen  Take 1,000 mg by mouth every 6 (six) hours as needed for mild pain (pain score 1-3) or headache.        Follow-up Information     Almira Jaeger, MD Follow up in 1 week(s).   Specialty: Family Medicine Contact information: [address] [place] [address] [phone]                Allergies  Allergen Reactions   Benadryl  [Diphenhydramine  Hcl] Itching and Other (See Comments)    Patient stated  allergy after ordered by MD   Morphine And Codeine Itching    You were cared for by a hospitalist during your hospital stay. If you have any questions about your discharge medications or the care you received while you were in the hospital after you are discharged, you can call the unit and asked to speak with the hospitalist on call if the hospitalist that took care of you is not available. Once you are discharged, your primary care physician will handle any further medical issues. Please note that no refills for any discharge medications will be authorized once you are discharged, as it is imperative that you return to your primary care physician (or establish a relationship with a primary care physician if you do not have one) for your aftercare needs so that they can reassess your need for medications and monitor your lab values.  You were cared for by a hospitalist during your hospital stay. If you have any questions about your discharge medications or  the care you received while you were in the hospital after you are discharged, you can call the unit and asked to speak with the hospitalist on call if the hospitalist that took care of you is not available. Once you are discharged, your primary care physician will handle any further medical issues. Please note that NO REFILLS for any discharge medications will be authorized once you are discharged, as it is imperative that you return to your primary care physician (or establish a relationship with a primary care physician if you do not have one) for your aftercare needs so that they can reassess your need for medications and monitor your lab values.  Please request your Prim.MD to go over all Hospital Tests and Procedure/Radiological results at the follow up, please get all Hospital records sent to your Prim MD by signing hospital release before you go home.  Get CBC, CMP, 2 view Chest X ray checked  by Primary MD during your next visit or SNF MD in  5-7 days ( we routinely change or add medications that can affect your baseline labs and fluid status, therefore we recommend that you get the mentioned basic workup next visit with your PCP, your PCP may decide not to get them or add new tests based on their clinical decision)  On your next visit with your primary care physician please Get Medicines reviewed and adjusted.  If you experience worsening of your admission symptoms, develop shortness of breath, life threatening emergency, suicidal or homicidal thoughts you must seek medical attention immediately by calling 911 or calling your MD immediately  if symptoms less severe.  You Must read complete instructions/literature along with all the possible adverse reactions/side effects for all the Medicines you take and that have been prescribed to you. Take any new Medicines after you have completely understood and accpet all the possible adverse reactions/side effects.   Do not drive, operate heavy machinery, perform activities at heights, swimming or participation in water activities or provide baby sitting services if your were admitted for syncope or siezures until you have seen by Primary MD or a Neurologist and advised to do so again.  Do not drive when taking Pain medications.   Procedures/Studies: DG Chest 2 View Result Date: 10/01/2023 CLINICAL DATA:  Shortness of breath. EXAM: CHEST - 2 VIEW COMPARISON:  Chest radiograph dated 09/12/2023. FINDINGS: Small left pleural effusion and left lung base atelectasis or infiltrate. No pneumothorax. Stable cardiomegaly. No acute osseous pathology. IMPRESSION: Small left pleural effusion and left lung base atelectasis or infiltrate. Electronically Signed   By: Angus Bark M.D.   On: 10/01/2023 15:21   ECHOCARDIOGRAM LIMITED Result Date: 09/13/2023    ECHOCARDIOGRAM LIMITED REPORT   Patient Name:   ACELIN FERDIG Date of Exam: 09/13/2023 Medical Rec #:  [id number]      Height:       70.0 in Accession  #:    [id number]     Weight:       303.4 lb Date of Birth:  [birth year]      BSA:          2.492 m Patient Age:    63 years       BP:           140/78 mmHg Patient Gender: M              HR:           89 bpm. Exam Location:  Inpatient Procedure: Limited Echo, Cardiac  Doppler and Color Doppler (Both Spectral and            Color Flow Doppler were utilized during procedure). Indications:    I31.3 Pericardial effusion (noninflammatory)  History:        Patient has prior history of Echocardiogram examinations, most                 recent 07/30/2023. Abnormal ECG, Arrythmias:Atrial Fibrillation,                 Signs/Symptoms:Chest Pain; Risk Factors:Hypertension and Current                 Smoker.  Sonographer:    Raynelle Callow RDCS Referring Phys: [id number] [facility] GOEL  Sonographer Comments: Technically difficult study due to poor echo windows and patient is obese. Image acquisition challenging due to patient body habitus. IMPRESSIONS  1. Left ventricular ejection fraction, by estimation, is 60 to 65%. The left ventricle has normal function. The left ventricle has no regional wall motion abnormalities. There is mild left ventricular hypertrophy. Left ventricular diastolic parameters are indeterminate.  2. Right ventricular systolic function is mildly reduced. The right ventricular size is mildly enlarged. Tricuspid regurgitation signal is inadequate for assessing PA pressure.  3. Left atrial size was severely dilated.  4. A small pericardial effusion is present. The pericardial effusion is posterior to the left ventricle.  5. The mitral valve is abnormal. Mild mitral valve regurgitation. No evidence of mitral stenosis.  6. The aortic valve was not well visualized. There is mild calcification of the aortic valve. There is mild thickening of the aortic valve. Aortic valve regurgitation is trivial. Aortic valve sclerosis is present, with no evidence of aortic valve stenosis.  7. Aortic dilatation noted. There is mild dilatation of  the ascending aorta, measuring 39 mm.  8. The inferior vena cava is dilated in size with >50% respiratory variability, suggesting right atrial pressure of 8 mmHg. FINDINGS  Left Ventricle: Left ventricular ejection fraction, by estimation, is 60 to 65%. The left ventricle has normal function. The left ventricle has no regional wall motion abnormalities. The left ventricular internal cavity size was normal in size. There is  mild left ventricular hypertrophy. Left ventricular diastolic parameters are indeterminate. Right Ventricle: The right ventricular size is mildly enlarged. No increase in right ventricular wall thickness. Right ventricular systolic function is mildly reduced. Tricuspid regurgitation signal is inadequate for assessing PA pressure. Left Atrium: Left atrial size was severely dilated. Right Atrium: Right atrial size was normal in size. Pericardium: A small pericardial effusion is present. The pericardial effusion is posterior to the left ventricle. Mitral Valve: The mitral valve is abnormal. There is mild thickening of the mitral valve leaflet(s). There is mild calcification of the mitral valve leaflet(s). Mild mitral annular calcification. Mild mitral valve regurgitation. No evidence of mitral valve stenosis. Tricuspid Valve: The tricuspid valve is normal in structure. Tricuspid valve regurgitation is mild . No evidence of tricuspid stenosis. Aortic Valve: The aortic valve was not well visualized. There is mild calcification of the aortic valve. There is mild thickening of the aortic valve. Aortic valve regurgitation is trivial. Aortic valve sclerosis is present, with no evidence of aortic valve stenosis. Pulmonic Valve: The pulmonic valve was normal in structure. Pulmonic valve regurgitation is not visualized. No evidence of pulmonic stenosis. Aorta: Aortic dilatation noted. There is mild dilatation of the ascending aorta, measuring 39 mm. Venous: The inferior vena cava is dilated in size with  greater than 50%  respiratory variability, suggesting right atrial pressure of 8 mmHg. IAS/Shunts: No atrial level shunt detected by color flow Doppler. Additional Comments: Spectral Doppler performed. Color Doppler performed.  LEFT VENTRICLE PLAX 2D LVIDd:         4.70 cm LVIDs:         3.30 cm LV PW:         1.40 cm LV IVS:        1.20 cm LVOT diam:     2.20 cm LVOT Area:     3.80 cm  LV Volumes (MOD) LV vol d, MOD A2C: 91.9 ml LV vol d, MOD A4C: 84.0 ml LV vol s, MOD A2C: 21.8 ml LV vol s, MOD A4C: 29.6 ml LV SV MOD A2C:     70.1 ml LV SV MOD A4C:     84.0 ml LV SV MOD BP:      63.1 ml IVC IVC diam: 2.45 cm LEFT ATRIUM         Index LA diam:    5.90 cm 2.37 cm/m   SHUNTS Systemic Diam: 2.20 cm Janelle Mediate MD Electronically signed by Janelle Mediate MD Signature Date/Time: 09/13/2023/12:41:04 PM    Final    CT Angio Chest/Abd/Pel for Dissection W and/or Wo Contrast Result Date: 09/12/2023 CLINICAL DATA:  Acute aortic syndrome (AAS) suspected, chest pain and nausea, atrial fibrillation EXAM: CT ANGIOGRAPHY CHEST, ABDOMEN AND PELVIS TECHNIQUE: Non-contrast CT of the chest was initially obtained. Multidetector CT imaging through the chest, abdomen and pelvis was performed using the standard protocol during bolus administration of intravenous contrast. Multiplanar reconstructed images and MIPs were obtained and reviewed to evaluate the vascular anatomy. RADIATION DOSE REDUCTION: This exam was performed according to the departmental dose-optimization program which includes automated exposure control, adjustment of the mA and/or kV according to patient size and/or use of iterative reconstruction technique. CONTRAST:  OMNIPAQUE  IOHEXOL  350 MG/ML SOLN COMPARISON:  July 29, 2023, August 03, 2020 FINDINGS: CTA CHEST FINDINGS Pulmonary Embolism: No pulmonary embolism. Cardiovascular: Moderate cardiomegaly. Dense multi-vessel coronary atherosclerosis. Small amount of pericardial effusion with apparent thickening of the  pericardium and induration of the cardiophrenic fat. No aortic aneurysm, intramural hematoma, or aortic dissection. Variant 3 vessel aortic arch anatomy with the left common carotid arising from the brachiocephalic artery. Diminutive left vertebral artery arises directly from the aortic arch. Scattered calcified atherosclerosis throughout the thoracic aorta. Mediastinum/Nodes: No mediastinal mass. No mediastinal, hilar, or axillary lymphadenopathy. Lungs/Pleura: The midline trachea and bronchi are patent. No focal airspace consolidation, pleural effusion, or pneumothorax. Posterior bibasilar dependent atelectasis. Fibrolinear scarring in the lingula and left lower lobe. Musculoskeletal: No acute fracture or destructive bone lesion. Thoracic DISH. Review of the MIP images confirms the above findings. CTA ABDOMEN AND PELVIS FINDINGS VASCULAR Aorta: No aortic aneurysm, intramural hematoma, or dissection. Scattered calcified atherosclerosis throughout the aorta. No hemodynamically significant stenosis. Celiac: Patent without acute thrombus, aneurysm, or dissection. No hemodynamically significant stenosis. SMA: Patent without acute thrombus, aneurysm, or dissection. No hemodynamically significant stenosis. Renals: 2 right renal arteries with a single left renal artery noted. Patent without acute thrombus, aneurysm, or dissection. No hemodynamically significant stenosis. IMA: Patent without acute thrombus, aneurysm, or dissection. No hemodynamically significant stenosis. Inflow: Patent without acute thrombus, aneurysm, or dissection. No hemodynamically significant stenosis. Proximal Outflow: The bilateral common femoral and visualized portions of the superficial and profunda femoral arteries are patent without acute thrombus, aneurysm, or dissection. No hemodynamically significant stenosis. Veins: Not well evaluated due to the arterial phase  of contrast. Review of the MIP images confirms the above findings. NON-VASCULAR  Hepatobiliary: No mass.No radiopaque stones or wall thickening of the gallbladder.No intrahepatic or extrahepatic biliary ductal dilation. Pancreas: No mass or main ductal dilation.No peripancreatic inflammation or fluid collection. Spleen: Normal size. No mass. Adrenals/Urinary Tract: No adrenal masses. No renal mass. No hydronephrosis or nephrolithiasis. Completely decompressed with perivesicular stranding and a small left lateral bladder diverticulum. Stomach/Bowel: The stomach is decompressed without focal abnormality. No small bowel wall thickening or inflammation. No small bowel obstruction. Normal appendix. Sigmoid diverticulosis. Lymphatic: No intraabdominal or pelvic lymphadenopathy. Reproductive: No prostatomegaly.no free pelvic fluid. Other: No pneumoperitoneum, ascites, or mesenteric inflammation. Musculoskeletal: No acute fracture or destructive lesion.Multilevel degenerative disc disease of the spine. Review of the MIP images confirms the above findings. IMPRESSION: 1. No aortic aneurysm, intramural hematoma, or aortic dissection. 2. Moderate cardiomegaly with dense multi-vessel coronary atherosclerosis. Small pericardial effusion with thickening of the pericardial sac and apparent induration of the epicardial fat, which may represent changes of pericarditis, in the correct clinical context. 3. Completely decompressed urinary bladder with perivesicular stranding and a small left lateral bladder diverticulum. Correlation with urinalysis recommended to exclude acute cystitis. 4. Sigmoid diverticulosis.  No changes of acute diverticulitis. Aortic Atherosclerosis (ICD10-I70.0). Electronically Signed   By: Rance Burrows M.D.   On: 09/12/2023 13:06   DG Chest Portable 1 View Result Date: 09/12/2023 CLINICAL DATA:  Chest pain and nausea beginning this morning. Atrial fibrillation. EXAM: PORTABLE CHEST 1 VIEW COMPARISON:  One-view chest x-ray 07/29/2023 FINDINGS: The heart is enlarged. Atherosclerotic  calcifications are present at the aortic arch. Mild interstitial edema is present. Small effusions are suspected. IMPRESSION: Cardiomegaly and mild interstitial edema compatible with congestive heart failure. Electronically Signed   By: Audree Leas M.D.   On: 09/12/2023 11:44     The results of significant diagnostics from this hospitalization (including imaging, microbiology, ancillary and laboratory) are listed below for reference.     Microbiology: Recent Results (from the past 240 hours)  Resp panel by RT-PCR (RSV, Flu A&B, Covid) Anterior Nasal Swab     Status: None   Collection Time: 10/01/23 12:16 PM   Specimen: Anterior Nasal Swab  Result Value Ref Range Status   SARS Coronavirus 2 by RT PCR NEGATIVE NEGATIVE Final    Comment: (NOTE) SARS-CoV-2 target nucleic acids are NOT DETECTED.  The SARS-CoV-2 RNA is generally detectable in upper respiratory specimens during the acute phase of infection. The lowest concentration of SARS-CoV-2 viral copies this assay can detect is 138 copies/mL. A negative result does not preclude SARS-Cov-2 infection and should not be used as the sole basis for treatment or other patient management decisions. A negative result may occur with  improper specimen collection/handling, submission of specimen other than nasopharyngeal swab, presence of viral mutation(s) within the areas targeted by this assay, and inadequate number of viral copies(<138 copies/mL). A negative result must be combined with clinical observations, patient history, and epidemiological information. The expected result is Negative.  Fact Sheet for Patients:  BloggerCourse.com  Fact Sheet for Healthcare Providers:  SeriousBroker.it  This test is no t yet approved or cleared by the [place]  FDA and  has been authorized for detection and/or diagnosis of SARS-CoV-2 by FDA under an Emergency Use Authorization (EUA). This  EUA will remain  in effect (meaning this test can be used) for the duration of the COVID-19 declaration under Section 564(b)(1) of the Act, 21 U.S.C.section 360bbb-3(b)(1), unless the authorization is terminated  or  revoked sooner.       Influenza A by PCR NEGATIVE NEGATIVE Final   Influenza B by PCR NEGATIVE NEGATIVE Final    Comment: (NOTE) The Xpert Xpress SARS-CoV-2/FLU/RSV plus assay is intended as an aid in the diagnosis of influenza from Nasopharyngeal swab specimens and should not be used as a sole basis for treatment. Nasal washings and aspirates are unacceptable for Xpert Xpress SARS-CoV-2/FLU/RSV testing.  Fact Sheet for Patients: BloggerCourse.com  Fact Sheet for Healthcare Providers: SeriousBroker.it  This test is not yet approved or cleared by the [place]  FDA and has been authorized for detection and/or diagnosis of SARS-CoV-2 by FDA under an Emergency Use Authorization (EUA). This EUA will remain in effect (meaning this test can be used) for the duration of the COVID-19 declaration under Section 564(b)(1) of the Act, 21 U.S.C. section 360bbb-3(b)(1), unless the authorization is terminated or revoked.     Resp Syncytial Virus by PCR NEGATIVE NEGATIVE Final    Comment: (NOTE) Fact Sheet for Patients: BloggerCourse.com  Fact Sheet for Healthcare Providers: SeriousBroker.it  This test is not yet approved or cleared by the [place]  FDA and has been authorized for detection and/or diagnosis of SARS-CoV-2 by FDA under an Emergency Use Authorization (EUA). This EUA will remain in effect (meaning this test can be used) for the duration of the COVID-19 declaration under Section 564(b)(1) of the Act, 21 U.S.C. section 360bbb-3(b)(1), unless the authorization is terminated or revoked.  Performed at [facility], [address] [place], [place] [address]      Labs: BNP (last 3 results) Recent Labs    07/29/23 1420 09/12/23 1032 10/01/23 1216  BNP 61.2 67.6 180.3*   Basic Metabolic Panel: Recent Labs  Lab 10/01/23 1216 10/02/23 0519 10/03/23 0445  NA 135 133* 136  K 4.2 3.5 3.5  CL 100 98 99  CO2 27 26 27   GLUCOSE 118* 109* 99  BUN 9 9 16   CREATININE 0.46* 0.70 0.77  CALCIUM  8.9 8.3* 8.7*  MG  --   --  2.0   Liver Function Tests: Recent Labs  Lab 10/01/23 1216  AST 20  ALT 23  ALKPHOS 69  BILITOT 0.9  PROT 7.5  ALBUMIN 3.2*   No results for input(s): "LIPASE", "AMYLASE" in the last 168 hours. No results for input(s): "AMMONIA" in the last 168 hours. CBC: Recent Labs  Lab 10/01/23 1216 10/02/23 0822  WBC 6.4 6.4  NEUTROABS 4.5  --   HGB 15.7 15.3  HCT 46.8 46.1  MCV 100.4* 100.7*  PLT 378 395   Cardiac Enzymes: No results for input(s): "CKTOTAL", "CKMB", "CKMBINDEX", "TROPONINI" in the last 168 hours. BNP: Invalid input(s): "POCBNP" CBG: No results for input(s): "GLUCAP" in the last 168 hours. D-Dimer No results for input(s): "DDIMER" in the last 72 hours. Hgb A1c No results for input(s): "HGBA1C" in the last 72 hours. Lipid Profile No results for input(s): "CHOL", "HDL", "LDLCALC", "TRIG", "CHOLHDL", "LDLDIRECT" in the last 72 hours. Thyroid  function studies No results for input(s): "TSH", "T4TOTAL", "T3FREE", "THYROIDAB" in the last 72 hours.  Invalid input(s): "FREET3" Anemia work up No results for input(s): "VITAMINB12", "FOLATE", "FERRITIN", "TIBC", "IRON", "RETICCTPCT" in the last 72 hours. Urinalysis    Component Value Date/Time   COLORURINE YELLOW 09/14/2023 0416   APPEARANCEUR CLEAR 09/14/2023 0416   LABSPEC 1.021 09/14/2023 0416   PHURINE 5.0 09/14/2023 0416   GLUCOSEU NEGATIVE 09/14/2023 0416   HGBUR MODERATE (A) 09/14/2023 0416   BILIRUBINUR NEGATIVE 09/14/2023  0416   BILIRUBINUR positive 03/16/2021 1514   KETONESUR NEGATIVE 09/14/2023 0416    PROTEINUR 100 (A) 09/14/2023 0416   UROBILINOGEN 1.0 03/16/2021 1514   UROBILINOGEN 0.2 09/06/2008 0840   NITRITE NEGATIVE 09/14/2023 0416   LEUKOCYTESUR NEGATIVE 09/14/2023 0416   Sepsis Labs Recent Labs  Lab 10/01/23 1216 10/02/23 0822  WBC 6.4 6.4   Microbiology Recent Results (from the past 240 hours)  Resp panel by RT-PCR (RSV, Flu A&B, Covid) Anterior Nasal Swab     Status: None   Collection Time: 10/01/23 12:16 PM   Specimen: Anterior Nasal Swab  Result Value Ref Range Status   SARS Coronavirus 2 by RT PCR NEGATIVE NEGATIVE Final    Comment: (NOTE) SARS-CoV-2 target nucleic acids are NOT DETECTED.  The SARS-CoV-2 RNA is generally detectable in upper respiratory specimens during the acute phase of infection. The lowest concentration of SARS-CoV-2 viral copies this assay can detect is 138 copies/mL. A negative result does not preclude SARS-Cov-2 infection and should not be used as the sole basis for treatment or other patient management decisions. A negative result may occur with  improper specimen collection/handling, submission of specimen other than nasopharyngeal swab, presence of viral mutation(s) within the areas targeted by this assay, and inadequate number of viral copies(<138 copies/mL). A negative result must be combined with clinical observations, patient history, and epidemiological information. The expected result is Negative.  Fact Sheet for Patients:  BloggerCourse.com  Fact Sheet for Healthcare Providers:  SeriousBroker.it  This test is no t yet approved or cleared by the [place]  FDA and  has been authorized for detection and/or diagnosis of SARS-CoV-2 by FDA under an Emergency Use Authorization (EUA). This EUA will remain  in effect (meaning this test can be used) for the duration of the COVID-19 declaration under Section 564(b)(1) of the Act, 21 U.S.C.section 360bbb-3(b)(1), unless the  authorization is terminated  or revoked sooner.       Influenza A by PCR NEGATIVE NEGATIVE Final   Influenza B by PCR NEGATIVE NEGATIVE Final    Comment: (NOTE) The Xpert Xpress SARS-CoV-2/FLU/RSV plus assay is intended as an aid in the diagnosis of influenza from Nasopharyngeal swab specimens and should not be used as a sole basis for treatment. Nasal washings and aspirates are unacceptable for Xpert Xpress SARS-CoV-2/FLU/RSV testing.  Fact Sheet for Patients: BloggerCourse.com  Fact Sheet for Healthcare Providers: SeriousBroker.it  This test is not yet approved or cleared by the [place]  FDA and has been authorized for detection and/or diagnosis of SARS-CoV-2 by FDA under an Emergency Use Authorization (EUA). This EUA will remain in effect (meaning this test can be used) for the duration of the COVID-19 declaration under Section 564(b)(1) of the Act, 21 U.S.C. section 360bbb-3(b)(1), unless the authorization is terminated or revoked.     Resp Syncytial Virus by PCR NEGATIVE NEGATIVE Final    Comment: (NOTE) Fact Sheet for Patients: BloggerCourse.com  Fact Sheet for Healthcare Providers: SeriousBroker.it  This test is not yet approved or cleared by the [place]  FDA and has been authorized for detection and/or diagnosis of SARS-CoV-2 by FDA under an Emergency Use Authorization (EUA). This EUA will remain in effect (meaning this test can be used) for the duration of the COVID-19 declaration under Section 564(b)(1) of the Act, 21 U.S.C. section 360bbb-3(b)(1), unless the authorization is terminated or revoked.  Performed at [facility], [address] [place], [place] [address]      Time coordinating discharge:  I have  spent 35 minutes face to face with the patient and on the ward discussing the patients care, assessment, plan and  disposition with other care givers. >50% of the time was devoted counseling the patient about the risks and benefits of treatment/Discharge disposition and coordinating care.   SIGNED:   Maggie Schooner, MD  [facility] 10/04/2023, 11:56 AM   If 7PM-7AM, please contact night-coverage

## 2023-10-04 NOTE — Progress Notes (Signed)
 Mobility Specialist - Progress Note Nurse requested Mobility Specialist to perform oxygen saturation test with pt which includes removing pt from oxygen both at rest and while ambulating.  Below are the results from that testing.     Patient Saturations on Room Air at Rest = spO2 93%  Patient Saturations on Room Air while Ambulating = sp02 93-95% .  At end of testing pt left in room on RA.  Reported results to nurse.     10/04/23 0920  Mobility  Activity Ambulated independently in hallway  Level of Assistance Independent  Assistive Device None  Distance Ambulated (ft) 400 ft  Range of Motion/Exercises Active  Activity Response Tolerated well  Mobility Referral Yes  Mobility visit 1 Mobility  Mobility Specialist Start Time (ACUTE ONLY) 0910  Mobility Specialist Stop Time (ACUTE ONLY) 0920  Mobility Specialist Time Calculation (min) (ACUTE ONLY) 10 min   Pt was found in bed and agreeable to ambulate. Had x1 brief standing rest break. Returned to recliner chair with all needs met. Call bell in reach.  Lorna Rose,  Mobility Specialist Can be reached via Secure Chat

## 2023-10-04 NOTE — TOC Transition Note (Signed)
 Transition of Care [facility]) - Discharge Note   Patient Details  Name: [name] [name] MRN: [id number] Date of Birth: 21-Jan-1961  [facility] CM/SW Contact:  Levie Ream, RN Phone Number: 10/04/2023, 11:18 AM   Clinical Narrative:    D/C orders received; no TOC needs.   Final next level of care: Home/Self Care Barriers to Discharge: No Barriers Identified   Patient Goals and CMS Choice   CMS Medicare.gov Compare Post Acute Care list provided to:: Patient Choice offered to / list presented to : Patient [employer] ownership interest in [facility].provided to:: Patient    Discharge Placement                       Discharge Plan and Services Additional resources added to the After Visit Summary for                                       Social Drivers of Health (SDOH) Interventions SDOH Screenings   Food Insecurity: No Food Insecurity (10/01/2023)  Housing: Low Risk  (10/01/2023)  Recent Concern: Housing - High Risk (07/29/2023)  Transportation Needs: No Transportation Needs (10/01/2023)  Utilities: Not At Risk (10/01/2023)  Depression (PHQ2-9): Low Risk  (03/24/2023)  Social Connections: Moderately Isolated (10/01/2023)  Tobacco Use: Medium Risk (10/01/2023)     Readmission Risk Interventions     No data to display

## 2023-10-04 NOTE — Plan of Care (Signed)
  Problem: Education: Goal: Ability to demonstrate management of disease process will improve Outcome: Progressing Goal: Ability to verbalize understanding of medication therapies will improve Outcome: Progressing Goal: Individualized Educational Video(s) Outcome: Progressing   Problem: Activity: Goal: Capacity to carry out activities will improve Outcome: Progressing   Problem: Cardiac: Goal: Ability to achieve and maintain adequate cardiopulmonary perfusion will improve Outcome: Progressing   Problem: Health Behavior/Discharge Planning: Goal: Ability to manage health-related needs will improve Outcome: Progressing   Problem: Clinical Measurements: Goal: Ability to maintain clinical measurements within normal limits will improve Outcome: Progressing Goal: Respiratory complications will improve Outcome: Progressing Goal: Cardiovascular complication will be avoided Outcome: Progressing   Problem: Activity: Goal: Risk for activity intolerance will decrease Outcome: Progressing

## 2023-10-07 ENCOUNTER — Telehealth: Payer: Self-pay | Admitting: *Deleted

## 2023-10-07 NOTE — Transitions of Care (Post Inpatient/ED Visit) (Signed)
   10/07/2023  Name: [name] [name] MRN: [id number] DOB: Mar 02, 1961  Today's TOC FU Call Status: Today's TOC FU Call Status:: Unsuccessful Call (1st Attempt) Unsuccessful Call (1st Attempt) Date: 10/07/23  Attempted to reach the patient regarding the most recent Inpatient/ED visit.  Follow Up Plan: Additional outreach attempts will be made to reach the patient to complete the Transitions of Care (Post Inpatient/ED visit) call.   Una Ganser BSN RN [facility] Health Care Management Coordinator [email or web address] Direct Dial: [phone]  Fax: [phone] Website: [email or web address]

## 2023-10-08 ENCOUNTER — Telehealth: Payer: Self-pay | Admitting: *Deleted

## 2023-10-08 ENCOUNTER — Ambulatory Visit: Attending: Physician Assistant | Admitting: Physician Assistant

## 2023-10-08 NOTE — Transitions of Care (Post Inpatient/ED Visit) (Signed)
   10/08/2023  Name: CHAOS CARLILE MRN: [id number] DOB: 10/08/1960  Today's TOC FU Call Status: Today's TOC FU Call Status:: Unsuccessful Call (2nd Attempt) Unsuccessful Call (2nd Attempt) Date: 10/08/23  Attempted to reach the patient regarding the most recent Inpatient/ED visit.  Follow Up Plan: Additional outreach attempts will be made to reach the patient to complete the Transitions of Care (Post Inpatient/ED visit) call.   Una Ganser BSN RN [facility] Health Care Management Coordinator [email or web address] Direct Dial: [phone]  Fax: [phone] Website: [email or web address].

## 2023-10-10 ENCOUNTER — Telehealth: Payer: Self-pay | Admitting: *Deleted

## 2023-10-10 DIAGNOSIS — I509 Heart failure, unspecified: Secondary | ICD-10-CM

## 2023-10-10 NOTE — Transitions of Care (Post Inpatient/ED Visit) (Signed)
 10/10/2023  Name: [name] [name] MRN: [id number] DOB: February 03, 1961  Today's TOC FU Call Status: Today's TOC FU Call Status:: Successful TOC FU Call Completed TOC FU Call Complete Date: 10/10/23 Patient's Name and Date of Birth confirmed.  Transition Care Management Follow-up Telephone Call Date of Discharge: 10/04/23 Discharge Facility: [facility]) Type of Discharge: Inpatient Admission Primary Inpatient Discharge Diagnosis:: Acute diastolic CHF (congestive heart failure) How have you been since you were released from the hospital?: Better Any questions or concerns?: Yes Patient Questions/Concerns:: Eliquis  is too expensive, will pickup today but will have issues with affordability  $637 Patient Questions/Concerns Addressed: Other: (pharmacy referral completed)  Items Reviewed: Did you receive and understand the discharge instructions provided?: Yes Medications obtained,verified, and reconciled?: Yes (Medications Reviewed) Any new allergies since your discharge?: No Dietary orders reviewed?: Yes Type of Diet Ordered:: low sodium   heart heatlhy Do you have support at home?: Yes Name of Support/Comfort Primary Source: pt does not specify if he lives alone,  states " I have help if I need it" Urgent referral placed for pharmacy, pt cannot afford Eliquis , he will pickup today but states the cost is 637$ and will not be able to afford going forward, would like resources/ assistance or other options Reviewed Heart Failure action plan, importance of daily weights Patient is agreeable for pharmacist to contact him but declines further outreach from RN Care Manager  Medications Reviewed Today: Medications Reviewed Today     Reviewed by Daralyn Earl, RN (Registered Nurse) on 10/10/23 at 1205  Med List Status: <None>   Medication Order Taking? Sig Documenting Provider Last Dose Status Informant  amLODipine  (NORVASC ) 10 MG tablet [id number] Yes Take 10 mg by mouth daily. [provider] Taking Active Self, Pharmacy Records  apixaban  (ELIQUIS ) 5 MG TABS tablet [id number] Yes Take 1 tablet (5 mg total) by mouth 2 (two) times daily. Barbee Lew, MD Taking Active Self, Pharmacy Records           Med Note Emmy Harper Oct 02, 2023  8:28 AM) Audelia Blazer time.   buPROPion  (WELLBUTRIN  XL) 150 MG 24 hr tablet [id number] Yes TAKE 1 TABLET(150 MG) BY MOUTH DAILY Almira Jaeger, MD Taking Active Self, Pharmacy Records  furosemide (LASIX) 40 MG tablet [id number] Yes Take 1 tablet (40 mg total) by mouth daily. Maggie Schooner, MD Taking Active   lisinopril (ZESTRIL) 5 MG tablet [id number] Yes Take 1 tablet (5 mg total) by mouth daily. Maggie Schooner, MD Taking Active   metoprolol  tartrate (LOPRESSOR ) 50 MG tablet [id number] Yes Take 1 tablet (50 mg total) by mouth 2 (two) times daily. Vann, Jessica U, DO Taking Active Self, Pharmacy Records  potassium chloride  SA (KLOR-CON  M) 20 MEQ tablet [id number] Yes Take 1 tablet (20 mEq total) by mouth daily. Maggie Schooner, MD Taking Active   rosuvastatin  (CRESTOR ) 5 MG tablet [id number] No Take 1 tablet (5 mg total) by mouth daily.  Patient not taking: Reported on 10/02/2023   Barbee Lew, MD Not Taking Active Self, Pharmacy Records  Secukinumab  (COSENTYX  UNOREADY) 300 MG/2ML [place] [id number] No Inject 300 mg into the skin every 30 (thirty) days. Starting at week 4, inject one pen monthly  Patient not taking: Reported on 10/10/2023   Paci, Karina M, MD Not Taking Active Self, Pharmacy Records           Med Note Emmy Harper Oct 02, 2023  8:30  AM) Due now. Has not been able to administer yet.  TYLENOL  500 MG tablet [id number] Yes Take 1,000 mg by mouth every 6 (six) hours as needed for mild pain (pain score 1-3) or headache. [provider] Taking Active Self, Pharmacy Records            Home Care and Equipment/Supplies: Were Home Health Services Ordered?: No Any new equipment or medical supplies  ordered?: No  Functional Questionnaire: Do you need assistance with bathing/showering or dressing?: No Do you need assistance with meal preparation?: No Do you need assistance with eating?: No Do you have difficulty maintaining continence: No Do you need assistance with getting out of bed/getting out of a chair/moving?: No Do you have difficulty managing or taking your medications?: No  Follow up appointments reviewed: PCP Follow-up appointment confirmed?: Yes Date of PCP follow-up appointment?: 11/03/23 (collaborated with care guide to schedule post hospital follow up appointment, pt declined sooner appointments due to will not see another provider and will only accept appointments after 12 noon) Follow-up Provider: Dr. Clarisa Crooked @ 1 pm Specialist Hospital Follow-up appointment confirmed?: Yes Date of Specialist follow-up appointment?: 10/24/23 Follow-Up Specialty Provider:: cardiologist Do you need transportation to your follow-up appointment?: No Do you understand care options if your condition(s) worsen?: Yes-patient verbalized understanding  SDOH Interventions Today    Flowsheet Row Most Recent Value  SDOH Interventions   Food Insecurity Interventions Intervention Not Indicated  Housing Interventions Intervention Not Indicated  Transportation Interventions Intervention Not Indicated  Utilities Interventions Intervention Not Indicated       Cecilie Coffee [facility], BSN RN Care Manager/ Transition of Care / [facility] Population Health [phone]

## 2023-10-10 NOTE — Progress Notes (Signed)
 This encounter was created in error - please disregard.

## 2023-10-14 ENCOUNTER — Telehealth: Payer: Self-pay | Admitting: *Deleted

## 2023-10-14 NOTE — Progress Notes (Signed)
 Care Guide Pharmacy Note  10/14/2023 Name: DARWIN ROTHLISBERGER MRN: [id number] DOB: Jan 28, 1961  Referred By: Almira Jaeger, MD Reason for referral: Complex Care Management and Call Attempt #1 (Outreach to schedule referral with pharmacist )   MALEKI HIPPE is a 63 y.o. year old male who is a primary care patient of Almira Jaeger, MD.  Rada Buerger was referred to the pharmacist for assistance related to: CHF  An unsuccessful telephone outreach was attempted today to contact the patient who was referred to the pharmacy team for assistance with medication assistance. Additional attempts will be made to contact the patient.  Kandis Ormond, CMA [place]  [facility], [facility] Guide Direct Dial: [phone]  Fax: [phone] Website: [email or web address]

## 2023-10-14 NOTE — Progress Notes (Signed)
 Care Guide Pharmacy Note  10/14/2023 Name: [name] [name] MRN: [id number] DOB: 1960-09-17  Referred By: Almira Jaeger, MD Reason for referral: Complex Care Management and Call Attempt #1 (Outreach to schedule referral with pharmacist )   [name] is a 63 y.o. year old male who is a primary care patient of Almira Jaeger, MD.  Rada Buerger was referred to the pharmacist for assistance related to: CHF  Successful contact was made with the patient to discuss pharmacy services including being ready for the pharmacist to call at least 5 minutes before the scheduled appointment time and to have medication bottles and any blood pressure readings ready for review. The patient agreed to meet with the pharmacist via telephone visit on 10/16/2023  Kandis Ormond, CMA [place]  [facility], [facility] Guide Direct Dial: [phone]  Fax: [phone] Website: [email or web address]

## 2023-10-16 ENCOUNTER — Other Ambulatory Visit: Payer: Self-pay | Admitting: Pharmacist

## 2023-10-16 NOTE — Progress Notes (Signed)
 10/16/2023 Name: [name] [name] MRN: [id number] DOB: 1961-02-01  Chief Complaint  Patient presents with   Medication Management    [name] [name] is a 63 y.o. year old male who presented for a telephone visit.   They were referred to the pharmacist by their PCP for assistance in managing medication access.    Subjective:  Medication Access/Adherence  Current Pharmacy:  [facility] [id number] - [place], [place] - [address] [place] [address] Phone: [phone] Fax: [phone]  [place] [facility] [address] [place] [address] Phone: [phone] Fax: [phone]   Patient reports affordability concerns with their medications: Yes  Patient reports access/transportation concerns to their pharmacy: No  Patient reports adherence concerns with their medications:  Yes     Patient has had difficulty getting Eliquis  from his pharmacy due to cost. He was told that it would be >$600. Initially was unable to reach patient so I called [facility] - they had [employer] as his insurance and they are not current contracted with [employer] for the $600 was the full cost of Eliquis .  Epic is showing patient has [employer] for pharmacy benefits but there is no ID number available. [facility] did an Ship broker but they were not able to get his ID number.   I did reach out to patient again and he was not feeling well. He was not able to get HTA ID information    Objective:  Lab Results  Component Value Date   HGBA1C 5.5 10/31/2017    Lab Results  Component Value Date   CREATININE 0.77 10/03/2023   BUN 16 10/03/2023   NA 136 10/03/2023   K 3.5 10/03/2023   CL 99 10/03/2023   CO2 27 10/03/2023    Lab Results  Component Value Date   CHOL 150 07/31/2023   HDL 67 07/31/2023   LDLCALC 70 07/31/2023   TRIG 66 07/31/2023   CHOLHDL 2.2 07/31/2023    Medications Reviewed Today     Reviewed by  Cecilie Coffee, RPH-CPP (Pharmacist) on 10/16/23 at 1548  Med List Status: <None>   Medication Order Taking? Sig Documenting Provider Last Dose Status Informant  amLODipine  (NORVASC ) 10 MG tablet [id number] No Take 10 mg by mouth daily. [provider] Taking Active Self, Pharmacy Records  apixaban  (ELIQUIS ) 5 MG TABS tablet [id number] No Take 1 tablet (5 mg total) by mouth 2 (two) times daily. Barbee Lew, MD Taking Active Self, Pharmacy Records           Med Note [place], JULIE A   Fri Oct 10, 2023 12:19 PM) Picking up today per pt 10/10/23  buPROPion  (WELLBUTRIN  XL) 150 MG 24 hr tablet [id number] No TAKE 1 TABLET(150 MG) BY MOUTH DAILY Almira Jaeger, MD Taking Active Self, Pharmacy Records  furosemide  (LASIX ) 40 MG tablet [id number] No Take 1 tablet (40 mg total) by mouth daily. Maggie Schooner, MD Taking Active   lisinopril  (ZESTRIL ) 5 MG tablet [id number] No Take 1 tablet (5 mg total) by mouth daily. Maggie Schooner, MD Taking Active   metoprolol  tartrate (LOPRESSOR ) 50 MG tablet [id number] No Take 1 tablet (50 mg total) by mouth 2 (two) times daily. Vann, Jessica U, DO Taking Active Self, Pharmacy Records  potassium chloride  SA (KLOR-CON  M) 20 MEQ tablet [id number] No Take 1 tablet (20 mEq total) by mouth daily. Maggie Schooner, MD Taking Active   rosuvastatin  (CRESTOR ) 5  MG tablet [id number] No Take 1 tablet (5 mg total) by mouth daily.  Patient not taking: Reported on 10/02/2023   Barbee Lew, MD Not Taking Active Self, Pharmacy Records  Secukinumab  (COSENTYX  UNOREADY) 300 MG/2ML [place] [id number] No Inject 300 mg into the skin every 30 (thirty) days. Starting at week 4, inject one pen monthly  Patient not taking: Reported on 10/10/2023   Paci, Karina M, MD Not Taking Active Self, Pharmacy Records           Med Note Emmy Harper Oct 02, 2023  8:30 AM) Due now. Has not been able to administer yet.  TYLENOL  500 MG tablet [id number] No Take 1,000 mg by mouth every 6  (six) hours as needed for mild pain (pain score 1-3) or headache. [provider] Taking Active Self, Pharmacy Records              Assessment/Plan:   Medication Management / Access: - Provided 30 days free card for Eliquis  to [facility] for the first fill - cost to patient will be $0 for this month.  - Patient asked that I call back tomorrow to discuss insurance and to get ID information for HTA.   Follow Up Plan: tomorrow  Cecilie Coffee, PharmD Clinical Pharmacist [facility]  [facility] [phone]

## 2023-10-17 ENCOUNTER — Other Ambulatory Visit: Payer: Self-pay | Admitting: Pharmacist

## 2023-10-17 NOTE — Progress Notes (Signed)
 10/17/2023 Name: [name] [name] MRN: [id number] DOB: 06-29-1960  Chief Complaint  Patient presents with   Medication Management    [name] [name] is a 63 y.o. year old male who presented for a telephone visit.   They were referred to the pharmacist by their PCP for assistance in managing medication access and complex medication management.    Subjective:  Medication Access/Adherence  Current Pharmacy:  [facility] [id number] - [place], [place] - [address] [place] [address] Phone: [phone] Fax: [phone]  [place] [facility] [address] [place] [address] Phone: [phone] Fax: [phone]   Patient reports affordability concerns with their medications: Yes  - Eliquis  he was told initially by Wilmer Hash that cost was > $600 but I was able to provide a 30 day free coupon for him yesterday - cost for the first month of Eliquis  will be $0 Patient reports access/transportation concerns to their pharmacy: No  Patient reports adherence concerns with their medications:  Yes   - he has not been able to received Cosyntex from [facility]. Per patient he received a text from them about auto refills but he is not sue if this means that they have filled it or not.   Wilmer Hash had [employer] as his insurance and they are not current contracted with [employer] but patient actually has [employer]. He provided me with the ID number today ID: [id number]   Heart Failure (EF 60-65%): Hospitalized recent with acute HF.  Patient has follow up with cardiology 10/24/2023  BNP (last 3 results) Recent Labs    07/29/23 1420 09/12/23 1032 10/01/23 1216  BNP 61.2 67.6 180.3*    Current medications:  ACEi/ARB/ARNI: lisinopril  5mg  daily prescribed at discharge but patient has not started yet.  SGLT2i: no Beta blocker: metoprolol  50mg  twice a day Mineralocorticoid Receptor Antagonist: no Diuretic  regimen: furosemide  40mg  daily prescribed but patient has not started yet.   Current home weights: Patient states he has been checking weight. He did not have any exact numbers to report states he has noticed weight has been decreasing.    Atrial Fibrillation:  Current medications: Rate Control: metoprolol  tartrate 50mg  twice a day Anticoagulation Regimen: Eliquis  5mg  once a day   CHA2DS2-VASc Score = 3   Medication Management: Patient has a lot of questions about which medications he should be taking and about why he had been prescribed so many medications for atrial fibrillation. He states a nurse discussed his recent CHF diagnosis when he left the hospital but that was the only time it was mentioned to him.  He has not started Eliquis , lisinopril , furosemide  or potassium. He has pantoprazole  and colchicine  at home but he was not sure why he was prescribed these medications.  Also amlodipine  was on his med list but he has not been taking it. He does not monitor blood pressure at home.   Discharge medications instructions 07/31/2023 - discontinued amlodipine  and olmesartan ; started metoprolol  25mg  twice a day and Eliquis  5mg  twice a day  09/16/2023 - started colchicine  0.6mg  twice a day #60 with 1RF / Ibuprofen  400mg  twice a day for 7 days, pantoprazole  40mg  daily  Changed metoprolol  tartrate - dose increased to 50mg  twice a day  10/04/2022 - discontinue colchicine  and pantoprazole  It was noted for him to continue to take amlodipine  but he was not given amlodipine  in the hospital and it was stopped 07/31/2023 - patient has not taken  since 07/31/2023.   Objective:  Lab Results  Component Value Date   HGBA1C 5.5 10/31/2017    Lab Results  Component Value Date   CREATININE 0.77 10/03/2023   BUN 16 10/03/2023   NA 136 10/03/2023   K 3.5 10/03/2023   CL 99 10/03/2023   CO2 27 10/03/2023    Lab Results  Component Value Date   CHOL 150 07/31/2023   HDL 67 07/31/2023    LDLCALC 70 07/31/2023   TRIG 66 07/31/2023   CHOLHDL 2.2 07/31/2023    Medications Reviewed Today     Reviewed by Cecilie Coffee, RPH-CPP (Pharmacist) on 10/17/23 at 1625  Med List Status: <None>   Medication Order Taking? Sig Documenting Provider Last Dose Status Informant  apixaban  (ELIQUIS ) 5 MG TABS tablet [id number] Yes Take 1 tablet (5 mg total) by mouth 2 (two) times daily. Barbee Lew, MD Taking Active Self, Pharmacy Records           Med Note [facility] [place] B   Fri Oct 17, 2023  1:08 PM)    buPROPion  (WELLBUTRIN  XL) 150 MG 24 hr tablet [id number] Yes TAKE 1 TABLET(150 MG) BY MOUTH DAILY Almira Jaeger, MD Taking Active Self, Pharmacy Records  furosemide  (LASIX ) 40 MG tablet [id number] No Take 1 tablet (40 mg total) by mouth daily.  Patient not taking: Reported on 10/17/2023   Maggie Schooner, MD Not Taking Active   lisinopril  (ZESTRIL ) 5 MG tablet [id number] No Take 1 tablet (5 mg total) by mouth daily.  Patient not taking: Reported on 10/17/2023   Maggie Schooner, MD Not Taking Active   metoprolol  tartrate (LOPRESSOR ) 50 MG tablet [id number] Yes Take 1 tablet (50 mg total) by mouth 2 (two) times daily. Vann, Jessica U, DO Taking Active Self, Pharmacy Records  pantoprazole  (PROTONIX ) 40 MG tablet [id number] Yes Take 40 mg by mouth daily. [provider] Taking Active   potassium chloride  SA (KLOR-CON  M) 20 MEQ tablet [id number] No Take 1 tablet (20 mEq total) by mouth daily.  Patient not taking: Reported on 10/17/2023   Maggie Schooner, MD Not Taking Active   rosuvastatin  (CRESTOR ) 5 MG tablet [id number] No Take 1 tablet (5 mg total) by mouth daily.  Patient not taking: Reported on 10/17/2023   Barbee Lew, MD Not Taking Active Self, Pharmacy Records  Secukinumab  (COSENTYX  UNOREADY) 300 MG/2ML [place] [id number]  Inject 300 mg into the skin every 30 (thirty) days. Starting at week 4, inject one pen monthly  Patient not taking: Reported on 10/10/2023   Paci, Lenore Rafter, MD   Active Self, Pharmacy Records           Med Note Emmy Harper Oct 02, 2023  8:30 AM) Due now. Has not been able to administer yet.  TYLENOL  500 MG tablet [id number]  Take 1,000 mg by mouth every 6 (six) hours as needed for mild pain (pain score 1-3) or headache. [provider]  Active Self, Pharmacy Records              Assessment/Plan:   Medication Management / Access: - Provided Wilmer Hash with [employer] information so they would process furosemide , potassium and lisinopril . He will start these medications and Eliquis  ASAP - Reviewed med list.  - Recommended that he not take amlodipine  since he has not taken in several months and could cause edema.  - Recommended that he restart colchicine  and pantoprazole  until he sees cardiology next week. I will  send a message to cardiology office about colchicine .  - Called [facility] regarding Cosentyx . They states that they are still waiting on prior authorization - info was faxed to Dr Shawn Delay office 10/15/2023. I did verify that [facility] has [employer] as his insurance.  -  Heart Failure (EF 60-65%): Hospitalized recent with acute HF.  Patient has follow up with cardiology 10/24/2023 - start meds prescribed at discharge - furosemide , potassium and lisinopril  - continue to take metoprolol  50mg  twice a day - weight daily and record. Call office if weight increases by more than 3lbs in 24 hours or 5 lbs in 1 week   Atrial Fibrillation: - Continue metoprolol  tartrate 50mg  twice a day - Start Eliquis  5mg  once a day  Follow Up Plan: 10 to 14 days.   Cecilie Coffee, PharmD Clinical Pharmacist [facility]  Population Health [phone]

## 2023-10-23 NOTE — Progress Notes (Deleted)
  Cardiology Office Note:  .   Date:  10/23/2023  ID:  [name] [name], DOB 03/07/1961, MRN [id number] PCP: [name] Jaeger, MD  [facility] Providers Cardiologist:  Ola Berger, MD {  History of Present Illness: .   [name] [name] is a 63 y.o. male with history of persistent atrial fibrillation, hypertension, coronary calcifications noted on CT.     Atrial fibrillation/pericarditis Diagnosed during admission 07/2023 with other complaints of chest pain, questionable pericarditis but normal inflammatory markers Echo with preserved EF, no valvular disease. Readmitted 09/14/2023 same presentation.  Pericarditis seemed more likely now with elevated inflammatory markers now and thickened pericardium on CT.  Started on colchicine  0.6 mg twice daily, ibuprofen  400 mg twice daily x 7, Protonix  Limited echo unchanged but now with severely dilated LA and small posterior pericardial effusion      Persistent atrial fibrillation Sleep study pending?  Coronary calcifications  Pericarditis with small pericardial effusion Noted on limited echocardiogram 09/13/2023    ROS: Denies: Chest pain, shortness of breath, orthopnea, peripheral edema, palpitations, decreased exercise intolerance, fatigue, lightheadedness.   Studies Reviewed: .         Risk Assessment/Calculations:   {Does this patient have ATRIAL FIBRILLATION?[phone]} No BP recorded.  {Refresh Note OR Click here to enter BP  :1}***       Physical Exam:   VS:  There were no vitals taken for this visit.   Wt Readings from Last 3 Encounters:  10/04/23 286 lb (129.7 kg)  09/12/23 (!) 303 lb 6.4 oz (137.6 kg)  07/29/23 135 lb 3.2 oz (61.3 kg)    GEN: Well nourished, well developed in no acute distress NECK: No JVD; No carotid bruits CARDIAC: ***RRR, no murmurs, rubs, gallops RESPIRATORY:  Clear to auscultation without rales, wheezing or rhonchi  ABDOMEN: Soft, non-tender, non-distended EXTREMITIES:  No edema; No deformity    ASSESSMENT AND PLAN: .         {Are you ordering a CV Procedure (e.g. stress test, cath, DCCV, TEE, etc)?   Press F2        :[id number]}  Dispo: ***  Signed, Burnetta Cart, PA-C

## 2023-10-24 ENCOUNTER — Ambulatory Visit: Admitting: Physician Assistant

## 2023-10-24 ENCOUNTER — Encounter: Payer: Self-pay | Admitting: Pharmacist

## 2023-10-28 NOTE — Progress Notes (Deleted)
   Cardiology Office Note    Date:  10/28/2023  ID:  [name] [name], DOB 1960/07/08, MRN [id number] PCP:  Almira Jaeger, MD  Cardiologist:  Ola Berger, MD  Electrophysiologist:  None   Chief Complaint: ***  History of Present Illness: .    [name] [name] is a 63 y.o. male with visit-pertinent history of HTN, obesity, persistent atrial fib diagnosed 07/2023, pericarditis 09/2023, coronary atherosclerosis on CT, suspected chronic HFpEF, RBBB, mild dilation of ascending aorta seen for follow-up. He was diagnosed with afib in 07/2023. He was rate controlled with plans for outpatient DCCV in 4 weeks. Chest pain that admission that resolved with rate control, negative workup for ACS. Patient did not follow-up at that time. He was admitted 09/2023 with chest pain, SOB and hypoxia.  Admitted again with chest pain and SOB.Found to be hypoxic. CT showed no acute aortic pathology, +moderate cardiomegaly, multivessel CAD, small pericardial effusion with thickening of pericardial sac, questionable pericarditis. Inflammatory markers were markedly elevated as well, therefore treated as pericarditis with possible diastolic HF exacerbation due to this. Limited echo 09/2023 EF 60-65%, mild LVH, severe LAE, small pericardial effusion, mild MR, mild dilation of ascending aorta. The plan was for colchicine  0.6mg  BID, ibuprofen  400mg  BID x 7 days and PPI given Eliquis  use. The 7 day recommendation was applied to the colchicine  at discharge per chart review. He was readmitted later in May with HF exacerbation prompting IV Lasix  and transition to oral form.  Needs 3 months of colchicine  Etoh mcv? Pt up Plan for afib   Pericarditis with small pericardial effusion Persistent atrial fibrillation with suspected OSA Chronic HFpEF CAD by cor CT Mild dilation of ascending aorta  Labwork independently reviewed: 09/2023 K 3.5, Cr 0.77, MG OK, H/H/plt OK, MCV up, alb 3.2, AST ALT OK, CRP/ESR up, trops neg, d-dimer 0.55 07/2023  LDL 70, trig 66, TSH OK   ROS: .    Please see the history of present illness. Otherwise, review of systems is positive for ***.  All other systems are reviewed and otherwise negative.  Studies Reviewed: Aaron Aas    EKG:  EKG is ordered today, personally reviewed, demonstrating ***  CV Studies: Cardiac studies reviewed are outlined and summarized above. Otherwise please see EMR for full report.   Current Reported Medications:.    No outpatient medications have been marked as taking for the 10/29/23 encounter (Appointment) with Mida Cory N, PA-C.    Physical Exam:    VS:  There were no vitals taken for this visit.   Wt Readings from Last 3 Encounters:  10/04/23 286 lb (129.7 kg)  09/12/23 (!) 303 lb 6.4 oz (137.6 kg)  07/29/23 135 lb 3.2 oz (61.3 kg)    GEN: Well nourished, well developed in no acute distress NECK: No JVD; No carotid bruits CARDIAC: ***RRR, no murmurs, rubs, gallops RESPIRATORY:  Clear to auscultation without rales, wheezing or rhonchi  ABDOMEN: Soft, non-tender, non-distended EXTREMITIES:  No edema; No acute deformity   Asessement and Plan:.     ***     Disposition: F/u with ***  Signed, Mosella Kasa N Bear Osten, PA-C

## 2023-10-29 ENCOUNTER — Ambulatory Visit: Admitting: Physician Assistant

## 2023-10-30 ENCOUNTER — Telehealth: Payer: Self-pay | Admitting: Pharmacist

## 2023-10-30 ENCOUNTER — Other Ambulatory Visit: Payer: Self-pay | Admitting: Pharmacist

## 2023-10-30 NOTE — Telephone Encounter (Signed)
 Second attempt to reach patient today was unsuccessful. LM on VM reminding patient patient his prescriptions are ready for pick up at [facility].

## 2023-10-30 NOTE — Telephone Encounter (Signed)
 Attempt was made to contact patient by phone today for follow up by Clinical Pharmacist regarding medication management.  Unable to reach patient. LM on VM with my contact number [phone].   I also called [facility] to see if patient had picked up prescriptions that were filled after our last phone visit on 10/24/2023. Per [facility] pharmacist they still have ELiquis , lisinopril  and other medications filled waiting for patient to pick up.  Noted that patient has missed other appointment either due to him not feeling well or provider illness.

## 2023-10-31 ENCOUNTER — Inpatient Hospital Stay (HOSPITAL_COMMUNITY)

## 2023-10-31 ENCOUNTER — Emergency Department (HOSPITAL_COMMUNITY)

## 2023-10-31 ENCOUNTER — Other Ambulatory Visit: Payer: Self-pay

## 2023-10-31 ENCOUNTER — Inpatient Hospital Stay (HOSPITAL_COMMUNITY)
Admission: EM | Admit: 2023-10-31 | Discharge: 2023-11-02 | DRG: 065 | Disposition: A | Discharge status: Home / Self Care | Attending: Internal Medicine | Admitting: Internal Medicine

## 2023-10-31 ENCOUNTER — Encounter (HOSPITAL_COMMUNITY): Payer: Self-pay

## 2023-10-31 DIAGNOSIS — K219 Gastro-esophageal reflux disease without esophagitis: Secondary | ICD-10-CM | POA: Diagnosis not present

## 2023-10-31 DIAGNOSIS — F321 Major depressive disorder, single episode, moderate: Secondary | ICD-10-CM | POA: Diagnosis not present

## 2023-10-31 DIAGNOSIS — Z885 Allergy status to narcotic agent status: Secondary | ICD-10-CM | POA: Diagnosis not present

## 2023-10-31 DIAGNOSIS — R29703 NIHSS score 3: Secondary | ICD-10-CM | POA: Diagnosis present

## 2023-10-31 DIAGNOSIS — I3139 Other pericardial effusion (noninflammatory): Secondary | ICD-10-CM | POA: Diagnosis present

## 2023-10-31 DIAGNOSIS — I5032 Chronic diastolic (congestive) heart failure: Secondary | ICD-10-CM | POA: Diagnosis not present

## 2023-10-31 DIAGNOSIS — Z823 Family history of stroke: Secondary | ICD-10-CM

## 2023-10-31 DIAGNOSIS — I6782 Cerebral ischemia: Secondary | ICD-10-CM | POA: Diagnosis not present

## 2023-10-31 DIAGNOSIS — T447X6A Underdosing of beta-adrenoreceptor antagonists, initial encounter: Secondary | ICD-10-CM | POA: Diagnosis not present

## 2023-10-31 DIAGNOSIS — I6523 Occlusion and stenosis of bilateral carotid arteries: Secondary | ICD-10-CM | POA: Diagnosis not present

## 2023-10-31 DIAGNOSIS — R471 Dysarthria and anarthria: Secondary | ICD-10-CM | POA: Diagnosis not present

## 2023-10-31 DIAGNOSIS — I10 Essential (primary) hypertension: Secondary | ICD-10-CM | POA: Diagnosis present

## 2023-10-31 DIAGNOSIS — Z91128 Patient's intentional underdosing of medication regimen for other reason: Secondary | ICD-10-CM | POA: Diagnosis not present

## 2023-10-31 DIAGNOSIS — I482 Chronic atrial fibrillation, unspecified: Secondary | ICD-10-CM | POA: Diagnosis present

## 2023-10-31 DIAGNOSIS — Z6841 Body Mass Index (BMI) 40.0 and over, adult: Secondary | ICD-10-CM

## 2023-10-31 DIAGNOSIS — I6501 Occlusion and stenosis of right vertebral artery: Secondary | ICD-10-CM | POA: Diagnosis not present

## 2023-10-31 DIAGNOSIS — R410 Disorientation, unspecified: Secondary | ICD-10-CM | POA: Diagnosis not present

## 2023-10-31 DIAGNOSIS — I6349 Cerebral infarction due to embolism of other cerebral artery: Principal | ICD-10-CM | POA: Diagnosis present

## 2023-10-31 DIAGNOSIS — I639 Cerebral infarction, unspecified: Secondary | ICD-10-CM | POA: Diagnosis not present

## 2023-10-31 DIAGNOSIS — Z8673 Personal history of transient ischemic attack (TIA), and cerebral infarction without residual deficits: Secondary | ICD-10-CM | POA: Diagnosis not present

## 2023-10-31 DIAGNOSIS — I6381 Other cerebral infarction due to occlusion or stenosis of small artery: Secondary | ICD-10-CM | POA: Diagnosis not present

## 2023-10-31 DIAGNOSIS — Z87891 Personal history of nicotine dependence: Secondary | ICD-10-CM

## 2023-10-31 DIAGNOSIS — I4891 Unspecified atrial fibrillation: Secondary | ICD-10-CM | POA: Diagnosis not present

## 2023-10-31 DIAGNOSIS — Z7901 Long term (current) use of anticoagulants: Secondary | ICD-10-CM | POA: Diagnosis not present

## 2023-10-31 DIAGNOSIS — I158 Other secondary hypertension: Secondary | ICD-10-CM | POA: Diagnosis present

## 2023-10-31 DIAGNOSIS — I6340 Cerebral infarction due to embolism of unspecified cerebral artery: Secondary | ICD-10-CM | POA: Diagnosis not present

## 2023-10-31 DIAGNOSIS — E785 Hyperlipidemia, unspecified: Secondary | ICD-10-CM | POA: Diagnosis not present

## 2023-10-31 DIAGNOSIS — R531 Weakness: Secondary | ICD-10-CM | POA: Diagnosis not present

## 2023-10-31 DIAGNOSIS — R29810 Facial weakness: Secondary | ICD-10-CM | POA: Diagnosis present

## 2023-10-31 DIAGNOSIS — I672 Cerebral atherosclerosis: Secondary | ICD-10-CM | POA: Diagnosis not present

## 2023-10-31 DIAGNOSIS — G8191 Hemiplegia, unspecified affecting right dominant side: Secondary | ICD-10-CM | POA: Diagnosis present

## 2023-10-31 DIAGNOSIS — R4701 Aphasia: Secondary | ICD-10-CM | POA: Diagnosis present

## 2023-10-31 DIAGNOSIS — Z743 Need for continuous supervision: Secondary | ICD-10-CM | POA: Diagnosis not present

## 2023-10-31 DIAGNOSIS — I63511 Cerebral infarction due to unspecified occlusion or stenosis of right middle cerebral artery: Secondary | ICD-10-CM | POA: Diagnosis not present

## 2023-10-31 DIAGNOSIS — R4182 Altered mental status, unspecified: Secondary | ICD-10-CM | POA: Diagnosis present

## 2023-10-31 DIAGNOSIS — R29818 Other symptoms and signs involving the nervous system: Secondary | ICD-10-CM | POA: Diagnosis not present

## 2023-10-31 DIAGNOSIS — R9089 Other abnormal findings on diagnostic imaging of central nervous system: Secondary | ICD-10-CM | POA: Diagnosis not present

## 2023-10-31 DIAGNOSIS — Z8249 Family history of ischemic heart disease and other diseases of the circulatory system: Secondary | ICD-10-CM

## 2023-10-31 LAB — DIFFERENTIAL
Abs Immature Granulocytes: 0.06 10*3/uL (ref 0.00–0.07)
Basophils Absolute: 0.1 10*3/uL (ref 0.0–0.1)
Basophils Relative: 1 %
Eosinophils Absolute: 0.1 10*3/uL (ref 0.0–0.5)
Eosinophils Relative: 2 %
Immature Granulocytes: 1 %
Lymphocytes Relative: 36 %
Lymphs Abs: 2.9 10*3/uL (ref 0.7–4.0)
Monocytes Absolute: 0.8 10*3/uL (ref 0.1–1.0)
Monocytes Relative: 10 %
Neutro Abs: 4 10*3/uL (ref 1.7–7.7)
Neutrophils Relative %: 50 %

## 2023-10-31 LAB — CBC
HCT: 51.2 % (ref 39.0–52.0)
Hemoglobin: 16.9 g/dL (ref 13.0–17.0)
MCH: 32.4 pg (ref 26.0–34.0)
MCHC: 33 g/dL (ref 30.0–36.0)
MCV: 98.3 fL (ref 80.0–100.0)
Platelets: 365 10*3/uL (ref 150–400)
RBC: 5.21 MIL/uL (ref 4.22–5.81)
RDW: 12.4 % (ref 11.5–15.5)
WBC: 7.8 10*3/uL (ref 4.0–10.5)
nRBC: 0 % (ref 0.0–0.2)

## 2023-10-31 LAB — RAPID URINE DRUG SCREEN, HOSP PERFORMED
Amphetamines: NOT DETECTED
Barbiturates: NOT DETECTED
Benzodiazepines: NOT DETECTED
Cocaine: NOT DETECTED
Opiates: NOT DETECTED
Tetrahydrocannabinol: NOT DETECTED

## 2023-10-31 LAB — COMPREHENSIVE METABOLIC PANEL WITH GFR
ALT: 16 U/L (ref 0–44)
AST: 22 U/L (ref 15–41)
Albumin: 3.7 g/dL (ref 3.5–5.0)
Alkaline Phosphatase: 70 U/L (ref 38–126)
Anion gap: 13 (ref 5–15)
BUN: 13 mg/dL (ref 8–23)
CO2: 23 mmol/L (ref 22–32)
Calcium: 9.3 mg/dL (ref 8.9–10.3)
Chloride: 104 mmol/L (ref 98–111)
Creatinine, Ser: 0.98 mg/dL (ref 0.61–1.24)
GFR, Estimated: >60 mL/min (ref >60)
Glucose, Bld: 101 mg/dL — ABNORMAL HIGH (ref 70–99)
Potassium: 4 mmol/L (ref 3.5–5.1)
Sodium: 140 mmol/L (ref 135–145)
Total Bilirubin: 0.8 mg/dL (ref 0.0–1.2)
Total Protein: 7.9 g/dL (ref 6.5–8.1)

## 2023-10-31 LAB — PROTIME-INR
INR: 1 (ref 0.8–1.2)
Prothrombin Time: 13.6 s (ref 11.4–15.2)

## 2023-10-31 LAB — I-STAT CHEM 8, ED
BUN: 13 mg/dL (ref 8–23)
Calcium, Ion: 1.09 mmol/L — ABNORMAL LOW (ref 1.15–1.40)
Chloride: 105 mmol/L (ref 98–111)
Creatinine, Ser: 0.9 mg/dL (ref 0.61–1.24)
Glucose, Bld: 104 mg/dL — ABNORMAL HIGH (ref 70–99)
HCT: 51 % (ref 39.0–52.0)
Hemoglobin: 17.3 g/dL — ABNORMAL HIGH (ref 13.0–17.0)
Potassium: 4 mmol/L (ref 3.5–5.1)
Sodium: 141 mmol/L (ref 135–145)
TCO2: 23 mmol/L (ref 22–32)

## 2023-10-31 LAB — APTT: aPTT: 31 s (ref 24–36)

## 2023-10-31 LAB — CBG MONITORING, ED: Glucose-Capillary: 107 mg/dL — ABNORMAL HIGH (ref 70–99)

## 2023-10-31 LAB — ETHANOL: Alcohol, Ethyl (B): <15 mg/dL (ref <15)

## 2023-10-31 MED ORDER — METOPROLOL TARTRATE 5 MG/5ML IV SOLN: 5.0000 mg | Freq: Three times a day (TID) | INTRAVENOUS | Status: DC | PRN

## 2023-10-31 MED ORDER — ACETAMINOPHEN 325 MG PO TABS
650.0000 mg | ORAL_TABLET | ORAL | Status: AC | PRN
Start: 2023-10-31 — End: ?
  Filled 2023-10-31: qty 2

## 2023-10-31 MED ORDER — ASPIRIN 325 MG PO TABS
325.0000 mg | ORAL_TABLET | Freq: Once | ORAL | Status: AC
  Administered 2023-10-31: 325 mg via ORAL
  Filled 2023-10-31: qty 1

## 2023-10-31 MED ORDER — ACETAMINOPHEN 650 MG RE SUPP: 650.0000 mg | RECTAL | Status: DC | PRN

## 2023-10-31 MED ORDER — SODIUM CHLORIDE 0.9 % IV SOLN: 100.0000 mL/h | INTRAVENOUS | Status: DC

## 2023-10-31 MED ORDER — SENNOSIDES-DOCUSATE SODIUM 8.6-50 MG PO TABS: 1.0000 | ORAL_TABLET | Freq: Every evening | ORAL | Status: DC | PRN

## 2023-10-31 MED ORDER — SODIUM CHLORIDE 0.9 % IV BOLUS
500.0000 mL | Freq: Once | INTRAVENOUS | Status: AC
  Administered 2023-10-31: 500 mL via INTRAVENOUS

## 2023-10-31 MED ORDER — STROKE: EARLY STAGES OF RECOVERY BOOK
Freq: Once | Status: AC
  Filled 2023-10-31: qty 1

## 2023-10-31 MED ORDER — IOHEXOL 350 MG/ML SOLN
100.0000 mL | Freq: Once | INTRAVENOUS | Status: AC | PRN
  Administered 2023-10-31: 100 mL via INTRAVENOUS

## 2023-10-31 MED ORDER — ACETAMINOPHEN 160 MG/5ML PO SOLN: 650.0000 mg | ORAL | Status: DC | PRN

## 2023-10-31 NOTE — Assessment & Plan Note (Addendum)
 63 year old presenting to ED with acute on set of trouble speaking and finding words around 11AM found to have age indeterminate lacunar infarct in right ventral pons -admit on telemetry for TIA/stroke work-up -Neurochecks per protocol -Neurology consulted -MRI brain without contrast ordered  -echo done 4 weeks ago with no atrial shunt  -lipid panel and A1C pending, goal LDL less than 70. Continue crestor  (appears to not be taking, restart)  -hx of atrial fib and has been off eliquis  for 1-2 weeks, maybe longer -hold eliquis  until MRI brain results  -Permissive hypertension first 24 hours <220/110 -N.p.o. until bedside swallow screen -PT/ OT/ SLP consult

## 2023-10-31 NOTE — Progress Notes (Signed)
 This encounter was created in error - please disregard.

## 2023-10-31 NOTE — ED Provider Triage Note (Cosign Needed)
 Emergency Medicine Provider Triage Evaluation Note  [name] [name] , a 63 y.o. male  was evaluated in triage.  Pt complains of focal weakness and slurred speech over the last 3 to 4 hours.    Review of Systems  Positive: Slurred speech, expressive aphasia Negative:   Physical Exam  There were no vitals taken for this visit. Gen:   Awake, patient has expressive aphasia, is able to nod in agreement when asked his name, is able to to voice Resp:  Tachypneic but for full breaths MSK:   Noted left-sided pronator drift, left-sided motor weakness in the upper extremity. Other:  Noted expressive aphasia, slurred speech  Medical Decision Making  Medically screening exam initiated at 1:57 PM.  Appropriate orders placed.  [name] [name] was informed that the remainder of the evaluation will be completed by another provider, this initial triage assessment does not replace that evaluation, and the importance of remaining in the ED until their evaluation is complete.  Based on this assessment, believe patient is code stroke, activated code stroke and patient taken for evaluation for the same.  Order set placed for code stroke.   [name] Nordmann, PA 10/31/23 1400

## 2023-10-31 NOTE — Assessment & Plan Note (Addendum)
 Euvolemic Stop IVF Echo 09/2023: normal EF. Indeterminate diastolic function. Mildly reduced RVF. Small pericardial effusion.  Strict I/o and daily weights  Continue medical management

## 2023-10-31 NOTE — Consult Note (Signed)
 NEUROLOGY TELESTROKE CONSULT NOTE   Date of service: October 31, 2023 Patient Name: [name] [name] MRN:  [id number] DOB:  1961-04-18 Chief Complaint:  Requesting Provider: Spero Dye, MD  Consult Participants: myself, patient, bedside RN, telestroke RN Location of the provider: [facility] Location of the patient: [facility]  This consult was provided via telemedicine with 2-way video and audio communication. The patient/family was informed that care would be provided in this way and agreed to receive care in this manner.   History of Present Illness   63 yo man with hx HTN, HL, recent diagnosis a fib 6 weeks ago now on eliquis  who presents with acute onset R facial droop and difficulty speaking. LKW 1200. Facial droop has resolved and he has no other motor weakness. Stroke code called for persistent aphasia with dysarthria. NIHSS = 3. CT head personal review showed no acute process. TNK was not administered 2/2 contraindication of being on eliquis . Patient states he is not sure if he took it this morning but stated he definitely took his dose last night. CTA showed no LVO therefore there was no indication for intervention.  LKW: 1200 Modified rankin score: 1-No significant post stroke disability and can perform usual duties with stroke symptoms IV Thrombolysis: no, patient on eliquis  EVT: no, no LVO  NIHSS = 3 (1 dysarthria, 2 aphasia)    ROS  UTA 2/2 aphasia  Past History   Past Medical History:  Diagnosis Date   Allergy    Boil 12/28/2021   Offered to lance, he declined Will use doxy for impetigo and he will use warm compresses.   Diverticulitis    leading to approx 1 foot resection-colectomy   History of kidney stones    Hx of adenomatous colonic polyps 10/19/2017   Hypertension    Impetigo any site 12/28/2021   Intertrigo 12/28/2021   NEPHROLITHIASIS, HX OF 03/09/2007   PONV (postoperative nausea and vomiting)    SPRAIN/STRAIN, ANKLE NOS 01/05/2007   Sweaty armpits 12/28/2021     Past Surgical History:  Procedure Laterality Date   COLECTOMY  09/2008   Dr Alray Askew   COLONOSCOPY     HERNIA REPAIR  09/18/12   lap incisional hernia repair, related to colectomy   VENTRAL HERNIA REPAIR N/A 09/18/2012    Family History: Family History  Problem Relation Age of Onset   CVA Mother        14   Heart attack Father        80   Sleep apnea Father        noncompliant with CPAP   Pulmonary Hypertension Father        related to cpap noncompliance. died 39 sepsis    Colon cancer Neg Hx    Colon polyps Neg Hx    Esophageal cancer Neg Hx    Rectal cancer Neg Hx    Stomach cancer Neg Hx     Social History  reports that he quit smoking about 7 years ago. His smoking use included cigarettes and e-cigarettes. He has never used smokeless tobacco. He reports current alcohol use. He reports that he does not currently use drugs.  Allergies  Allergen Reactions   Benadryl  [Diphenhydramine  Hcl] Itching and Other (See Comments)    Patient stated allergy after ordered by MD   Morphine And Codeine Itching    Medications   Current Facility-Administered Medications:    sodium chloride  0.9 % bolus 500 mL, 500 mL, Intravenous, Once **FOLLOWED BY** 0.9 %  sodium  chloride infusion, 100 mL/hr, Intravenous, Continuous, Marjorie Sieving C, PA   iohexol  (OMNIPAQUE ) 350 MG/ML injection 100 mL, 100 mL, Intravenous, Once PRN, Eleni Griffin, MD  Current Outpatient Medications:    apixaban  (ELIQUIS ) 5 MG TABS tablet, Take 1 tablet (5 mg total) by mouth 2 (two) times daily., Disp: 60 tablet, Rfl: 4   buPROPion  (WELLBUTRIN  XL) 150 MG 24 hr tablet, TAKE 1 TABLET(150 MG) BY MOUTH DAILY, Disp: 90 tablet, Rfl: 3   furosemide  (LASIX ) 40 MG tablet, Take 1 tablet (40 mg total) by mouth daily. (Patient not taking: Reported on 10/17/2023), Disp: 30 tablet, Rfl: 0   lisinopril  (ZESTRIL ) 5 MG tablet, Take 1 tablet (5 mg total) by mouth daily. (Patient not taking: Reported on 10/17/2023), Disp: 30 tablet,  Rfl: 0   metoprolol  tartrate (LOPRESSOR ) 50 MG tablet, Take 1 tablet (50 mg total) by mouth 2 (two) times daily., Disp: 60 tablet, Rfl: 1   pantoprazole  (PROTONIX ) 40 MG tablet, Take 40 mg by mouth daily., Disp: , Rfl:    potassium chloride  SA (KLOR-CON  M) 20 MEQ tablet, Take 1 tablet (20 mEq total) by mouth daily. (Patient not taking: Reported on 10/17/2023), Disp: 30 tablet, Rfl: 0   rosuvastatin  (CRESTOR ) 5 MG tablet, Take 1 tablet (5 mg total) by mouth daily. (Patient not taking: Reported on 10/17/2023), Disp: 30 tablet, Rfl: 4   Secukinumab  (COSENTYX  UNOREADY) 300 MG/2ML SOAJ, Inject 300 mg into the skin every 30 (thirty) days. Starting at week 4, inject one pen monthly (Patient not taking: Reported on 10/10/2023), Disp: 2 mL, Rfl: 12   TYLENOL  500 MG tablet, Take 1,000 mg by mouth every 6 (six) hours as needed for mild pain (pain score 1-3) or headache., Disp: , Rfl:   Vitals  There were no vitals filed for this visit.  There is no height or weight on file to calculate BMI.  Physical Exam   Physical Exam Gen: lying in bed, NAD Resp: normal WOB CV: extremities appear well-perfused  Neurologic Examination   Neuro: *MS: Alert, able to answer orientation questions with some delay and difficulty but his answers are correct. He follows commands. *Speech: mild dysarthria, moderate expressive aphasia *CN: PERRL 3mm, EOMI, VFF by confrontation, sensation intact, smile symmetric, hearing intact to voice *Motor:   Normal bulk.  No tremor, rigidity or bradykinesia. No pronator drift. All extremities appear full-strength and symmetric. *Sensory: SILT. Symmetric. No double-simultaneous extinction.  *Coordination:  Finger-to-nose, heel-to-shin, rapid alternating motions were intact. *Reflexes:  UTA 2/2 tele-exam *Gait: deferred  Labs/Imaging/Neurodiagnostic studies   CBC: No results for input(s): WBC, NEUTROABS, HGB, HCT, MCV, PLT in the last 168 hours. Basic Metabolic Panel:  Lab  Results  Component Value Date   NA 136 10/03/2023   K 3.5 10/03/2023   CO2 27 10/03/2023   GLUCOSE 99 10/03/2023   BUN 16 10/03/2023   CREATININE 0.77 10/03/2023   CALCIUM  8.7 (L) 10/03/2023   GFRNONAA >60 10/03/2023   GFRAA 84 12/21/2019   Lipid Panel:  Lab Results  Component Value Date   LDLCALC 70 07/31/2023   HgbA1c:  Lab Results  Component Value Date   HGBA1C 5.5 10/31/2017   Urine Drug Screen: No results found for: LABOPIA, COCAINSCRNUR, LABBENZ, AMPHETMU, THCU, LABBARB  Alcohol Level No results found for: [facility] INR  Lab Results  Component Value Date   INR 1.2 10/01/2023   APTT  Lab Results  Component Value Date   APTT 35 09/13/2023   AED levels: No results found for:  PHENYTOIN, ZONISAMIDE, LAMOTRIGINE, LEVETIRACETA  CT Head without contrast(Personally reviewed): No acute process  CT angio Head and Neck with contrast(Personally reviewed): No LVO  ASSESSMENT   63 yo man with hx HTN, HL, recent diagnosis a fib 6 weeks ago now on eliquis  who presents with acute onset aphasia and dysarthria c/f acute ischemic stroke. We asked multiple times during the stroke code whether he was taking his prescribed eliquis  and he stated yes and told us  last dose was last night. He was therefore not a candidate for TNK. Unfortunately at 1655 I received a message from the admitting hospitalist that she had gone through his medication bottles one by one, and after she asked him where the eliquis  bottle was he admitted to running out of it 1-2 weeks ago. I wish that he had been more forthcoming about this when multiple providers tried to confirm with him multiple times whether or not he was taking it. Unfortunately when I was made aware of this the patient was now 25 min outside the TNK window and no longer a candidate. No intervention 2/2 no LVO.  RECOMMENDATIONS   - Admit for stroke workup to [facility] hospitalist service - Permissive HTN x48 hrs from sx onset or until  stroke ruled out by MRI goal BP <220/110. PRN labetalol or hydralazine  if BP above these parameters. Avoid oral antihypertensives. - MRI brain wo contrast - TTE w/ bubble - Check A1c and LDL + add statin per guidelines - ASA 325mg  now, stroke team will provide further guidance tomorrow after reviewing his MRI as to when he should restart eliquis  - q4 hr neuro checks - STAT head CT for any change in neuro exam - Tele - PT/OT/SLP - Stroke education - Amb referral to neurology upon discharge   Please notify [facility] neurohospitalist upon patient arrival to [facility]  ______________________________________________________________________    Signed, Eleni Griffin, MD [facility]

## 2023-10-31 NOTE — ED Triage Notes (Signed)
 Patient presented to ER with inability to speak, ride sided facial droop. Last known well about 1-2 hours ago.

## 2023-10-31 NOTE — Assessment & Plan Note (Signed)
 Allow for permissive HTN in setting of acute CVA  Prn lopressor  with parameters

## 2023-10-31 NOTE — ED Notes (Signed)
 Report given to floor nurse

## 2023-10-31 NOTE — Assessment & Plan Note (Signed)
 Rate controlled Hold metoprolol  for now to allow for permissive Htn in settting of acute CVA Has not been on eliqius for at least 1-2 weeks, maybe longer Hold until MRI results

## 2023-10-31 NOTE — ED Notes (Signed)
 Patient used urinal on side of bed

## 2023-10-31 NOTE — ED Notes (Signed)
 This nurse attempted to contact [facility]/Floor nurse to give report. Was advised by the Unit Secretary that she is busy and will call back for report.

## 2023-10-31 NOTE — Assessment & Plan Note (Signed)
Continue wellbutrin 150 mg daily

## 2023-10-31 NOTE — ED Provider Notes (Signed)
 [place] EMERGENCY DEPARTMENT AT [facility] Provider Note   CSN: [id number] Arrival date & time: 10/31/23  1349     Patient presents with: Code Stroke   [name] [name] is a 63 y.o. male.   HPI  Presents due to altered mental status.  According to the neighbor, patient was found in his car outside his house.  He was talking abnormal.  This is a dramatic change for the patient.  According to the neighbor, he has not seen the patient since yesterday.  No placing the patient this morning.  When speaking to the patient, he states he did not fall.  Denies any traumatic events.  He does state he did take Eliquis .  Last took Eliquis  last night.  He thinks his difficulty speaking started approximately 2 hours ago we sent really sure.   Previous medical history reviewed : Patient was last admitted in May 2025.  Cute diastolic CHF.  A-fib chronic.  On Lopressor  and Eliquis .     Prior to Admission medications   Medication Sig Start Date End Date Taking? Authorizing Provider  apixaban  (ELIQUIS ) 5 MG TABS tablet Take 1 tablet (5 mg total) by mouth 2 (two) times daily. 07/31/23   Barbee Lew, MD  buPROPion  (WELLBUTRIN  XL) 150 MG 24 hr tablet TAKE 1 TABLET(150 MG) BY MOUTH DAILY 06/11/23   Almira Jaeger, MD  furosemide  (LASIX ) 40 MG tablet Take 1 tablet (40 mg total) by mouth daily. Patient not taking: Reported on 10/17/2023 10/04/23 11/03/23  Maggie Schooner, MD  lisinopril  (ZESTRIL ) 5 MG tablet Take 1 tablet (5 mg total) by mouth daily. Patient not taking: Reported on 10/17/2023 10/05/23 11/04/23  Maggie Schooner, MD  metoprolol  tartrate (LOPRESSOR ) 50 MG tablet Take 1 tablet (50 mg total) by mouth 2 (two) times daily. 09/16/23   Vann, Jessica U, DO  pantoprazole  (PROTONIX ) 40 MG tablet Take 40 mg by mouth daily.    [provider]  potassium chloride  SA (KLOR-CON  M) 20 MEQ tablet Take 1 tablet (20 mEq total) by mouth daily. Patient not taking: Reported on 10/17/2023 10/04/23  11/03/23  Maggie Schooner, MD  rosuvastatin  (CRESTOR ) 5 MG tablet Take 1 tablet (5 mg total) by mouth daily. Patient not taking: Reported on 10/17/2023 07/31/23   Barbee Lew, MD  Secukinumab  (COSENTYX  UNOREADY) 300 MG/2ML SOAJ Inject 300 mg into the skin every 30 (thirty) days. Starting at week 4, inject one pen monthly Patient not taking: Reported on 10/10/2023 05/28/23   Deneise Finlay, MD  TYLENOL  500 MG tablet Take 1,000 mg by mouth every 6 (six) hours as needed for mild pain (pain score 1-3) or headache.    [provider]    Allergies: Benadryl  [diphenhydramine  hcl] and Morphine and codeine    Review of Systems  Constitutional:  Negative for chills and fever.  HENT:  Negative for ear pain and sore throat.   Eyes:  Negative for pain and visual disturbance.  Respiratory:  Negative for cough and shortness of breath.   Cardiovascular:  Negative for chest pain and palpitations.  Gastrointestinal:  Negative for abdominal pain and vomiting.  Genitourinary:  Negative for dysuria and hematuria.  Musculoskeletal:  Negative for arthralgias and back pain.  Skin:  Negative for color change and rash.  Neurological:  Negative for seizures and syncope.  All other systems reviewed and are negative.   Updated Vital Signs BP (!) 183/113 (BP Location: Right Arm)   Pulse (!) 106  Temp 97.8 F (36.6 C) (Oral)   Resp (!) 23   SpO2 96%   Physical Exam Vitals and nursing note reviewed.  Constitutional:      General: He is not in acute distress.    Appearance: He is well-developed.  HENT:     Head: Normocephalic and atraumatic.   Eyes:     Conjunctiva/sclera: Conjunctivae normal.    Cardiovascular:     Rate and Rhythm: Normal rate and regular rhythm.     Heart sounds: No murmur heard. Pulmonary:     Effort: Pulmonary effort is normal. No respiratory distress.     Breath sounds: Normal breath sounds.  Abdominal:     Palpations: Abdomen is soft.     Tenderness: There is no  abdominal tenderness.   Musculoskeletal:        General: No swelling.     Cervical back: Neck supple.   Skin:    General: Skin is warm and dry.     Capillary Refill: Capillary refill takes less than 2 seconds.   Neurological:     Mental Status: He is confused.     GCS: GCS eye subscore is 4. GCS verbal subscore is 5. GCS motor subscore is 6.     Cranial Nerves: Dysarthria present.     Motor: Motor function is intact. No pronator drift.     Coordination: Coordination is intact.     Comments: + dysarthria, + aphasia, No obvious focal weakness upper/lower extremity. No facial droop.   Psychiatric:        Mood and Affect: Mood normal.     (all labs ordered are listed, but only abnormal results are displayed) Labs Reviewed  COMPREHENSIVE METABOLIC PANEL WITH GFR - Abnormal; Notable for the following components:      Result Value   Glucose, Bld 101 (*)    All other components within normal limits  I-STAT CHEM 8, ED - Abnormal; Notable for the following components:   Glucose, Bld 104 (*)    Calcium , Ion 1.09 (*)    Hemoglobin 17.3 (*)    All other components within normal limits  CBG MONITORING, ED - Abnormal; Notable for the following components:   Glucose-Capillary 107 (*)    All other components within normal limits  ETHANOL  PROTIME-INR  APTT  CBC  DIFFERENTIAL  RAPID URINE DRUG SCREEN, HOSP PERFORMED    EKG: None  Radiology: CT ANGIO HEAD NECK W WO CM (CODE STROKE) Result Date: 10/31/2023 CLINICAL DATA:  Code stroke, neuro deficit, right-sided weakness and aphasia starting 1 hour ago. EXAM: CT ANGIOGRAPHY HEAD AND NECK CT PERFUSION BRAIN TECHNIQUE: Multidetector CT imaging of the head and neck was performed using the standard protocol during bolus administration of intravenous contrast. Multiplanar CT image reconstructions and MIPs were obtained to evaluate the vascular anatomy. Carotid stenosis measurements (when applicable) are obtained utilizing NASCET criteria,  using the distal internal carotid diameter as the denominator. Multiphase CT imaging of the brain was performed following IV bolus contrast injection. Subsequent parametric perfusion maps were calculated using RAPID software. RADIATION DOSE REDUCTION: This exam was performed according to the departmental dose-optimization program which includes automated exposure control, adjustment of the mA and/or kV according to patient size and/or use of iterative reconstruction technique. CONTRAST:  OMNIPAQUE  IOHEXOL  350 MG/ML SOLN COMPARISON:  Same day CT head. FINDINGS: CTA NECK FINDINGS Aortic arch: Common origin of the brachiocephalic and left common carotid arteries. Aortic arch is incompletely visualized with mild atherosclerosis noted. Pulmonary arteries:  Not well visualized. Subclavian arteries: Patent bilaterally.  Mild atherosclerosis. Right carotid system: Patent. Mild atherosclerosis at the carotid bifurcation without hemodynamically significant stenosis. No evidence of dissection. Mild tortuosity of the proximal common carotid artery and the mid cervical ICA. Left carotid system: Patent. Mild atherosclerosis at the carotid bifurcation without hemodynamically significant stenosis. No evidence of dissection. Retropharyngeal course of the distal common carotid and proximal cervical ICA. Mild tortuosity of the cervical ICA. Vertebral arteries: Right vertebral artery is dominant. The left vertebral artery originates on the aortic arch with atherosclerosis adjacent to the origin resulting in mild stenosis. Additional atherosclerosis at the right vertebral artery origin resulting in mild stenosis. Right vertebral artery is patent from the origin to the vertebrobasilar confluence. Non dominant left vertebral artery is patent to the intracranial segment and terminates at the left PICA. Skeleton: No acute findings. Degenerative changes in the cervical spine. Other neck: The visualized airway is patent. No cervical  lymphadenopathy. Upper chest: Large left pleural effusion is partially visualized. Patulous thoracic esophagus. Review of the MIP images confirms the above findings CTA HEAD FINDINGS ANTERIOR CIRCULATION: The intracranial ICAs are patent bilaterally. Atherosclerosis of the carotid siphons. Mild stenosis of the right supraclinoid ICA and mild-to-moderate stenosis of the left supraclinoid ICA. No severe stenosis, proximal occlusion, or aneurysm. 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 MCAs: The middle cerebral arteries are patent bilaterally. ACAs: The anterior cerebral arteries are patent bilaterally. POSTERIOR CIRCULATION: No significant stenosis, proximal occlusion, aneurysm, or vascular malformation. PCAs: Patent bilaterally.  Fetal origin of the right PCA. Pcomm: Visualized on the right. SCAs: The superior cerebellar arteries are patent bilaterally. Basilar artery: Patent AICAs: Not well visualized. PICAs: Patent Vertebral arteries: As above. Venous sinuses: As permitted by contrast timing, patent. 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 Fetal origin of the right PCA. Review of the MIP images confirms the above findings CT Brain Perfusion Findings: Difficulty obtaining perfusion maps. IMPRESSION: No large vessel occlusion. Atherosclerosis at the carotid bifurcations without hemodynamically significant stenosis. Mild stenosis at the vertebral artery origins. Atherosclerosis of the carotid siphons resulting in mild stenosis on the right and mild to moderate stenosis on the left. 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 Large left pleural effusion is partially visualized. **Difficulty obtaining perfusion maps. Will dictate addendum when these are available.** Aortic Atherosclerosis (ICD10-I70.0). Electronically Signed   By: Denny Flack M.D.   On: 10/31/2023 15:20   CT CEREBRAL PERFUSION W CONTRAST Result Date: 10/31/2023 CLINICAL DATA:  Code stroke, neuro deficit,  right-sided weakness and aphasia starting 1 hour ago. EXAM: CT ANGIOGRAPHY HEAD AND NECK CT PERFUSION BRAIN TECHNIQUE: Multidetector CT imaging of the head and neck was performed using the standard protocol during bolus administration of intravenous contrast. Multiplanar CT image reconstructions and MIPs were obtained to evaluate the vascular anatomy. Carotid stenosis measurements (when applicable) are obtained utilizing NASCET criteria, using the distal internal carotid diameter as the denominator. Multiphase CT imaging of the brain was performed following IV bolus contrast injection. Subsequent parametric perfusion maps were calculated using RAPID software. RADIATION DOSE REDUCTION: This exam was performed according to the departmental dose-optimization program which includes automated exposure control, adjustment of the mA and/or kV according to patient size and/or use of iterative reconstruction technique. CONTRAST:  OMNIPAQUE  IOHEXOL  350 MG/ML SOLN COMPARISON:  Same day CT head. FINDINGS: CTA NECK FINDINGS Aortic arch: Common origin of the brachiocephalic and left common carotid arteries. Aortic arch is incompletely visualized with mild atherosclerosis noted. Pulmonary  arteries: Not well visualized. Subclavian arteries: Patent bilaterally.  Mild atherosclerosis. Right carotid system: Patent. Mild atherosclerosis at the carotid bifurcation without hemodynamically significant stenosis. No evidence of dissection. Mild tortuosity of the proximal common carotid artery and the mid cervical ICA. Left carotid system: Patent. Mild atherosclerosis at the carotid bifurcation without hemodynamically significant stenosis. No evidence of dissection. Retropharyngeal course of the distal common carotid and proximal cervical ICA. Mild tortuosity of the cervical ICA. Vertebral arteries: Right vertebral artery is dominant. The left vertebral artery originates on the aortic arch with atherosclerosis adjacent to the origin  resulting in mild stenosis. Additional atherosclerosis at the right vertebral artery origin resulting in mild stenosis. Right vertebral artery is patent from the origin to the vertebrobasilar confluence. Non dominant left vertebral artery is patent to the intracranial segment and terminates at the left PICA. Skeleton: No acute findings. Degenerative changes in the cervical spine. Other neck: The visualized airway is patent. No cervical lymphadenopathy. Upper chest: Large left pleural effusion is partially visualized. Patulous thoracic esophagus. Review of the MIP images confirms the above findings CTA HEAD FINDINGS ANTERIOR CIRCULATION: The intracranial ICAs are patent bilaterally. Atherosclerosis of the carotid siphons. Mild stenosis of the right supraclinoid ICA and mild-to-moderate stenosis of the left supraclinoid ICA. No severe stenosis, proximal occlusion, or aneurysm. 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 MCAs: The middle cerebral arteries are patent bilaterally. ACAs: The anterior cerebral arteries are patent bilaterally. POSTERIOR CIRCULATION: No significant stenosis, proximal occlusion, aneurysm, or vascular malformation. PCAs: Patent bilaterally.  Fetal origin of the right PCA. Pcomm: Visualized on the right. SCAs: The superior cerebellar arteries are patent bilaterally. Basilar artery: Patent AICAs: Not well visualized. PICAs: Patent Vertebral arteries: As above. Venous sinuses: As permitted by contrast timing, patent. 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 Fetal origin of the right PCA. Review of the MIP images confirms the above findings CT Brain Perfusion Findings: Difficulty obtaining perfusion maps. IMPRESSION: No large vessel occlusion. Atherosclerosis at the carotid bifurcations without hemodynamically significant stenosis. Mild stenosis at the vertebral artery origins. Atherosclerosis of the carotid siphons resulting in mild stenosis on the right and mild to  moderate stenosis on the left. 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 Large left pleural effusion is partially visualized. **Difficulty obtaining perfusion maps. Will dictate addendum when these are available.** Aortic Atherosclerosis (ICD10-I70.0). Electronically Signed   By: Denny Flack M.D.   On: 10/31/2023 15:20   CT HEAD CODE STROKE WO CONTRAST Addendum Date: 10/31/2023 ADDENDUM REPORT: 10/31/2023 14:25 ADDENDUM: These results were called by telephone at the time of interpretation on 10/31/2023 at 2:21 pm to provider Dr. Faustino Hook, who verbally acknowledged these results. Electronically Signed   By: Denny Flack M.D.   On: 10/31/2023 14:25   Result Date: 10/31/2023 CLINICAL DATA:  Code stroke. Neuro deficit, concern for stroke, right-sided weakness. EXAM: CT HEAD WITHOUT CONTRAST TECHNIQUE: Contiguous axial images were obtained from the base of the skull through the vertex without intravenous contrast. RADIATION DOSE REDUCTION: This exam was performed according to the departmental dose-optimization program which includes automated exposure control, adjustment of the mA and/or kV according to patient size and/or use of iterative reconstruction technique. COMPARISON:  None Available. FINDINGS: Brain: No acute intracranial hemorrhage. No CT evidence of acute infarct. Nonspecific hypoattenuation in the periventricular and subcortical white matter favored to reflect chronic microvascular ischemic changes. Age indeterminate lacunar infarct in the right ventral pons. No edema, mass effect, or midline shift. The basilar cisterns are  patent. Ventricles: The ventricles are normal. Vascular: Atherosclerotic calcifications of the carotid siphons and intracranial vertebral arteries. No hyperdense vessel. Skull: No acute or aggressive finding. Orbits: Orbits are symmetric. Sinuses: Mucosal thickening in the maxillary sinuses, left greater than right. Other: Mastoid air cells are clear. ASPECTS [facility] Stroke  Program Early CT Score) - Ganglionic level infarction (caudate, lentiform nuclei, internal capsule, insula, M1-M3 cortex): 7 - Supraganglionic infarction (M4-M6 cortex): 3 Total score (0-10 with 10 being normal): 10 IMPRESSION: 1. No acute intracranial hemorrhage. 2. Age indeterminate lacunar infarct in the right ventral pons. 3. Chronic microvascular ischemic changes. 4. ASPECTS is 10 Electronically Signed: By: Denny Flack M.D. On: 10/31/2023 14:14     Procedures   Medications Ordered in the ED  sodium chloride  0.9 % bolus 500 mL (500 mLs Intravenous New Bag/Given 10/31/23 1506)    Followed by  0.9 %  sodium chloride  infusion (has no administration in time range)  iohexol  (OMNIPAQUE ) 350 MG/ML injection 100 mL (100 mLs Intravenous Contrast Given 10/31/23 1427)    Clinical Course as of 10/31/23 1554  Fri Oct 31, 2023  1420 Per Dr. Ethelle Herb: Has a small focus lacunar infarct right pons. Likely hold.  [TL]  1523 No LVO. Admit at [facility]. Hospital service. Hold eliquis  until get MRI back. Permissive htn.  [TL]    Clinical Course User Index [TL] Spero Dye, MD                                 Medical Decision Making Amount and/or Complexity of Data Reviewed Radiology: ordered.  Risk Decision regarding hospitalization.    Presents due to altered mental status.  According to the neighbor, patient was found in his car outside his house.  He was talking abnormal.  This is a dramatic change for the patient.  According to the neighbor, he has not seen the patient since yesterday.  No placing the patient this morning.  When speaking to the patient, he states he did not fall.  Denies any traumatic events.  He does state he did take Eliquis .  Last took Eliquis  last night.  He thinks his difficulty speaking started approximately 2 hours ago we sent really sure.   Previous medical history reviewed : Patient was last admitted in May 2025.  Cute diastolic CHF.  A-fib chronic.  On Lopressor  and  Eliquis .   NIH is approximately 3.  Positive for dysarthria, aphasia as well as some slight confusion in terms of where he is at.  Otherwise, could not appreciate other focal deficit in terms of strength or sensation.  No facial droop.  Normal finger-nose. No visual field deficit.   Neurostroke was consulted.  CT head was benign.  CTA did not show any Large vessel occlusion.  Patient's not a candidate for TNK given Eliquis  use.  They recommended patient to be admitted to [facility] under the hospitalist service with permissive hypertension as well as holding Eliquis  until MRI is completed.   Patient to be transferred to [facility] health.    CRITICAL CARE Performed by: Spero Dye   Total critical care time: 35 minutes  Critical care time was exclusive of separately billable procedures and treating other patients.  Critical care was necessary to treat or prevent imminent or life-threatening deterioration.  Critical care was time spent personally by me on the following activities: development of treatment plan with patient and/or surrogate as well as nursing, discussions  with consultants, evaluation of patient's response to treatment, examination of patient, obtaining history from patient or surrogate, ordering and performing treatments and interventions, ordering and review of laboratory studies, ordering and review of radiographic studies, pulse oximetry and re-evaluation of patient's condition.      Final diagnoses:  Cerebrovascular accident (CVA), unspecified mechanism [facility])    ED Discharge Orders     None          Spero Dye, MD 10/31/23 [phone]

## 2023-10-31 NOTE — Assessment & Plan Note (Signed)
 Continue PPI.

## 2023-10-31 NOTE — H&P (Addendum)
 History and Physical    Patient: [name] [name] EXB:[id number] DOB: 10-26-1960 DOA: 10/31/2023 DOS: the patient was seen and examined on 10/31/2023 PCP: Almira Jaeger, MD  Patient coming from: Home - lives alone. Ambulates independently.    Chief Complaint: dysarthria/aphasia   HPI: [name] [name] is a 63 y.o. male with medical history significant of HTN, diastolic CHF, atrial fibrillation on eliquis , psoriasis, depression, HLD, GERD who presented to ED with concerns for stroke. He states symptoms started late morning, around 11am.  He had eaten his breakfast and was watching the news. He was then walking around and he all of a sudden had trouble getting words out. He knew what he wanted to say, but the word wouldn't come out and sounds garbled. He also has had increased tingling in his right hand. He denies any facial drooping or extremity weakness. He is right handed.   He was admitted in May for newly diagnosed atrial fibrillation and then again on 5/21 for acute diastolic CHF. He was discharged with eliquis , but has run out and is unsure how long he has been out of this for, but likely 2+ weeks.   He has been feeling good. Denies any fever/chills, vision changes/headaches, chest pain or palpitations, shortness of breath or cough, abdominal pain, N/V/D, dysuria or leg swelling.    Admitted 10/01/23 for acute diastolic CHF exacerbation and some concerns for pericarditis. Completed 7 day course of colchicine  and ibuprofen .   He socially smokes and drinks occasionally   ER Course:  vitals: afebrile, bp: 183/113, HR: 106, RR: 23,oxygen: 96% RA Pertinent labs: none  CT head: no acute hemorrhage. Age indeterminate lacunar infarct in right ventral pons. Chronic microvascular ischemic changes.  CTA head/neck: no LVO. Mild stenosis at vertebral artery origins. Large left pleural effusion partially visualized. 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 Atherosclerosis of the carotid siphons  resulting in mild stenosis on the right and mild to moderate stenosis on the left. In ED: given 500cc IVF bolus, IVF, tele neurology and TRH asked to admit.    Review of Systems: As mentioned in the history of present illness. All other systems reviewed and are negative. Past Medical History:  Diagnosis Date   Allergy    Boil 12/28/2021   Offered to lance, he declined Will use doxy for impetigo and he will use warm compresses.   Diverticulitis    leading to approx 1 foot resection-colectomy   History of kidney stones    Hx of adenomatous colonic polyps 10/19/2017   Hypertension    Impetigo any site 12/28/2021   Intertrigo 12/28/2021   NEPHROLITHIASIS, HX OF 03/09/2007   PONV (postoperative nausea and vomiting)    SPRAIN/STRAIN, ANKLE NOS 01/05/2007   Sweaty armpits 12/28/2021   Past Surgical History:  Procedure Laterality Date   COLECTOMY  09/2008   Dr Alray Askew   COLONOSCOPY     HERNIA REPAIR  09/18/12   lap incisional hernia repair, related to colectomy   VENTRAL HERNIA REPAIR N/A 09/18/2012   Social History:  reports that he quit smoking about 7 years ago. His smoking use included cigarettes and e-cigarettes. He has never used smokeless tobacco. He reports current alcohol use. He reports that he does not currently use drugs.  Allergies  Allergen Reactions   Benadryl  [Diphenhydramine  Hcl] Itching and Other (See Comments)    Patient stated allergy after ordered by MD   Morphine And Codeine Itching    Family History  Problem Relation Age of Onset  CVA Mother        76   Heart attack Father        54   Sleep apnea Father        noncompliant with CPAP   Pulmonary Hypertension Father        related to cpap noncompliance. died 108 sepsis    Colon cancer Neg Hx    Colon polyps Neg Hx    Esophageal cancer Neg Hx    Rectal cancer Neg Hx    Stomach cancer Neg Hx     Prior to Admission medications   Medication Sig Start Date End Date Taking? Authorizing Provider  apixaban   (ELIQUIS ) 5 MG TABS tablet Take 1 tablet (5 mg total) by mouth 2 (two) times daily. 07/31/23   Barbee Lew, MD  buPROPion  (WELLBUTRIN  XL) 150 MG 24 hr tablet TAKE 1 TABLET(150 MG) BY MOUTH DAILY 06/11/23   Almira Jaeger, MD  furosemide  (LASIX ) 40 MG tablet Take 1 tablet (40 mg total) by mouth daily. Patient not taking: Reported on 10/17/2023 10/04/23 11/03/23  Maggie Schooner, MD  lisinopril  (ZESTRIL ) 5 MG tablet Take 1 tablet (5 mg total) by mouth daily. Patient not taking: Reported on 10/17/2023 10/05/23 11/04/23  Maggie Schooner, MD  metoprolol  tartrate (LOPRESSOR ) 50 MG tablet Take 1 tablet (50 mg total) by mouth 2 (two) times daily. 09/16/23   Vann, Jessica U, DO  pantoprazole  (PROTONIX ) 40 MG tablet Take 40 mg by mouth daily.    [provider]  potassium chloride  SA (KLOR-CON  M) 20 MEQ tablet Take 1 tablet (20 mEq total) by mouth daily. Patient not taking: Reported on 10/17/2023 10/04/23 11/03/23  Maggie Schooner, MD  rosuvastatin  (CRESTOR ) 5 MG tablet Take 1 tablet (5 mg total) by mouth daily. Patient not taking: Reported on 10/17/2023 07/31/23   Barbee Lew, MD  Secukinumab  (COSENTYX  UNOREADY) 300 MG/2ML SOAJ Inject 300 mg into the skin every 30 (thirty) days. Starting at week 4, inject one pen monthly Patient not taking: Reported on 10/10/2023 05/28/23   Deneise Finlay, MD  TYLENOL  500 MG tablet Take 1,000 mg by mouth every 6 (six) hours as needed for mild pain (pain score 1-3) or headache.    [provider]    Physical Exam: Vitals:   10/31/23 1432  BP: (!) 183/113  Pulse: (!) 106  Resp: (!) 23  Temp: 97.8 F (36.6 C)  TempSrc: Oral  SpO2: 96%   General:  Appears calm and comfortable and is in NAD. Disheveled with odor.  Eyes:  PERRL, EOMI, normal lids, iris ENT:  grossly normal hearing, lips & tongue, mmm; appropriate dentition Neck:  no LAD, masses or thyromegaly; no carotid bruits Cardiovascular:  irregularly irregular, no m/r/g. No LE edema.  Respiratory:    CTA bilaterally with no wheezes/rales/rhonchi.  Normal respiratory effort. Abdomen:  soft, NT, ND, NABS Back:   normal alignment, no CVAT Skin:  no rash or induration seen on limited exam. Seborrheic dermatitis of face  Musculoskeletal:  grossly normal tone BUE/BLE, good ROM, no bony abnormality Lower extremity:  No LE edema.  Limited foot exam with no ulcerations.  2+ distal pulses. Psychiatric:  grossly normal mood and affect, speech dysarthric with some aphasia AOx3 Neurologic:  CN 2-VIII grossly intact/. Has slight right sided tongue deviation and dysarthric speech.  Moves all extremities in coordinated fashion, sensation intact. HTK intact bilaterally. FTN intact bilaterally.    Radiological Exams on Admission: Independently reviewed - see discussion  in A/P where applicable  CT ANGIO HEAD NECK W WO CM (CODE STROKE) Result Date: 10/31/2023 CLINICAL DATA:  Code stroke, neuro deficit, right-sided weakness and aphasia starting 1 hour ago. EXAM: CT ANGIOGRAPHY HEAD AND NECK CT PERFUSION BRAIN TECHNIQUE: Multidetector CT imaging of the head and neck was performed using the standard protocol during bolus administration of intravenous contrast. Multiplanar CT image reconstructions and MIPs were obtained to evaluate the vascular anatomy. Carotid stenosis measurements (when applicable) are obtained utilizing NASCET criteria, using the distal internal carotid diameter as the denominator. Multiphase CT imaging of the brain was performed following IV bolus contrast injection. Subsequent parametric perfusion maps were calculated using RAPID software. RADIATION DOSE REDUCTION: This exam was performed according to the departmental dose-optimization program which includes automated exposure control, adjustment of the mA and/or kV according to patient size and/or use of iterative reconstruction technique. CONTRAST:  OMNIPAQUE  IOHEXOL  350 MG/ML SOLN COMPARISON:  Same day CT head. FINDINGS: CTA NECK FINDINGS  Aortic arch: Common origin of the brachiocephalic and left common carotid arteries. Aortic arch is incompletely visualized with mild atherosclerosis noted. Pulmonary arteries: Not well visualized. Subclavian arteries: Patent bilaterally.  Mild atherosclerosis. Right carotid system: Patent. Mild atherosclerosis at the carotid bifurcation without hemodynamically significant stenosis. No evidence of dissection. Mild tortuosity of the proximal common carotid artery and the mid cervical ICA. Left carotid system: Patent. Mild atherosclerosis at the carotid bifurcation without hemodynamically significant stenosis. No evidence of dissection. Retropharyngeal course of the distal common carotid and proximal cervical ICA. Mild tortuosity of the cervical ICA. Vertebral arteries: Right vertebral artery is dominant. The left vertebral artery originates on the aortic arch with atherosclerosis adjacent to the origin resulting in mild stenosis. Additional atherosclerosis at the right vertebral artery origin resulting in mild stenosis. Right vertebral artery is patent from the origin to the vertebrobasilar confluence. Non dominant left vertebral artery is patent to the intracranial segment and terminates at the left PICA. Skeleton: No acute findings. Degenerative changes in the cervical spine. Other neck: The visualized airway is patent. No cervical lymphadenopathy. Upper chest: Large left pleural effusion is partially visualized. Patulous thoracic esophagus. Review of the MIP images confirms the above findings CTA HEAD FINDINGS ANTERIOR CIRCULATION: The intracranial ICAs are patent bilaterally. Atherosclerosis of the carotid siphons. Mild stenosis of the right supraclinoid ICA and mild-to-moderate stenosis of the left supraclinoid ICA. No severe stenosis, proximal occlusion, or aneurysm. 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 MCAs: The middle cerebral arteries are patent bilaterally. ACAs: The anterior cerebral  arteries are patent bilaterally. POSTERIOR CIRCULATION: No significant stenosis, proximal occlusion, aneurysm, or vascular malformation. PCAs: Patent bilaterally.  Fetal origin of the right PCA. Pcomm: Visualized on the right. SCAs: The superior cerebellar arteries are patent bilaterally. Basilar artery: Patent AICAs: Not well visualized. PICAs: Patent Vertebral arteries: As above. Venous sinuses: As permitted by contrast timing, patent. 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 Fetal origin of the right PCA. Review of the MIP images confirms the above findings CT Brain Perfusion Findings: Difficulty obtaining perfusion maps. IMPRESSION: No large vessel occlusion. Atherosclerosis at the carotid bifurcations without hemodynamically significant stenosis. Mild stenosis at the vertebral artery origins. Atherosclerosis of the carotid siphons resulting in mild stenosis on the right and mild to moderate stenosis on the left. 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 Large left pleural effusion is partially visualized. **Difficulty obtaining perfusion maps. Will dictate addendum when these are available.** Aortic Atherosclerosis (ICD10-I70.0). Electronically Signed   By: Zoila Hines  Hunt M.D.   On: 10/31/2023 15:20   CT CEREBRAL PERFUSION W CONTRAST Result Date: 10/31/2023 CLINICAL DATA:  Code stroke, neuro deficit, right-sided weakness and aphasia starting 1 hour ago. EXAM: CT ANGIOGRAPHY HEAD AND NECK CT PERFUSION BRAIN TECHNIQUE: Multidetector CT imaging of the head and neck was performed using the standard protocol during bolus administration of intravenous contrast. Multiplanar CT image reconstructions and MIPs were obtained to evaluate the vascular anatomy. Carotid stenosis measurements (when applicable) are obtained utilizing NASCET criteria, using the distal internal carotid diameter as the denominator. Multiphase CT imaging of the brain was performed following IV bolus contrast injection.  Subsequent parametric perfusion maps were calculated using RAPID software. RADIATION DOSE REDUCTION: This exam was performed according to the departmental dose-optimization program which includes automated exposure control, adjustment of the mA and/or kV according to patient size and/or use of iterative reconstruction technique. CONTRAST:  OMNIPAQUE  IOHEXOL  350 MG/ML SOLN COMPARISON:  Same day CT head. FINDINGS: CTA NECK FINDINGS Aortic arch: Common origin of the brachiocephalic and left common carotid arteries. Aortic arch is incompletely visualized with mild atherosclerosis noted. Pulmonary arteries: Not well visualized. Subclavian arteries: Patent bilaterally.  Mild atherosclerosis. Right carotid system: Patent. Mild atherosclerosis at the carotid bifurcation without hemodynamically significant stenosis. No evidence of dissection. Mild tortuosity of the proximal common carotid artery and the mid cervical ICA. Left carotid system: Patent. Mild atherosclerosis at the carotid bifurcation without hemodynamically significant stenosis. No evidence of dissection. Retropharyngeal course of the distal common carotid and proximal cervical ICA. Mild tortuosity of the cervical ICA. Vertebral arteries: Right vertebral artery is dominant. The left vertebral artery originates on the aortic arch with atherosclerosis adjacent to the origin resulting in mild stenosis. Additional atherosclerosis at the right vertebral artery origin resulting in mild stenosis. Right vertebral artery is patent from the origin to the vertebrobasilar confluence. Non dominant left vertebral artery is patent to the intracranial segment and terminates at the left PICA. Skeleton: No acute findings. Degenerative changes in the cervical spine. Other neck: The visualized airway is patent. No cervical lymphadenopathy. Upper chest: Large left pleural effusion is partially visualized. Patulous thoracic esophagus. Review of the MIP images confirms the above  findings CTA HEAD FINDINGS ANTERIOR CIRCULATION: The intracranial ICAs are patent bilaterally. Atherosclerosis of the carotid siphons. Mild stenosis of the right supraclinoid ICA and mild-to-moderate stenosis of the left supraclinoid ICA. No severe stenosis, proximal occlusion, or aneurysm. 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 MCAs: The middle cerebral arteries are patent bilaterally. ACAs: The anterior cerebral arteries are patent bilaterally. POSTERIOR CIRCULATION: No significant stenosis, proximal occlusion, aneurysm, or vascular malformation. PCAs: Patent bilaterally.  Fetal origin of the right PCA. Pcomm: Visualized on the right. SCAs: The superior cerebellar arteries are patent bilaterally. Basilar artery: Patent AICAs: Not well visualized. PICAs: Patent Vertebral arteries: As above. Venous sinuses: As permitted by contrast timing, patent. 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 Fetal origin of the right PCA. Review of the MIP images confirms the above findings CT Brain Perfusion Findings: Difficulty obtaining perfusion maps. IMPRESSION: No large vessel occlusion. Atherosclerosis at the carotid bifurcations without hemodynamically significant stenosis. Mild stenosis at the vertebral artery origins. Atherosclerosis of the carotid siphons resulting in mild stenosis on the right and mild to moderate stenosis on the left. 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 Large left pleural effusion is partially visualized. **Difficulty obtaining perfusion maps. Will dictate addendum when these are available.** Aortic Atherosclerosis (ICD10-I70.0). Electronically Signed   By: Zoila Hines  Hunt M.D.   On: 10/31/2023 15:20   CT HEAD CODE STROKE WO CONTRAST Addendum Date: 10/31/2023 ADDENDUM REPORT: 10/31/2023 14:25 ADDENDUM: These results were called by telephone at the time of interpretation on 10/31/2023 at 2:21 pm to provider Dr. Faustino Hook, who verbally acknowledged these results.  Electronically Signed   By: Denny Flack M.D.   On: 10/31/2023 14:25   Result Date: 10/31/2023 CLINICAL DATA:  Code stroke. Neuro deficit, concern for stroke, right-sided weakness. EXAM: CT HEAD WITHOUT CONTRAST TECHNIQUE: Contiguous axial images were obtained from the base of the skull through the vertex without intravenous contrast. RADIATION DOSE REDUCTION: This exam was performed according to the departmental dose-optimization program which includes automated exposure control, adjustment of the mA and/or kV according to patient size and/or use of iterative reconstruction technique. COMPARISON:  None Available. FINDINGS: Brain: No acute intracranial hemorrhage. No CT evidence of acute infarct. Nonspecific hypoattenuation in the periventricular and subcortical white matter favored to reflect chronic microvascular ischemic changes. Age indeterminate lacunar infarct in the right ventral pons. No edema, mass effect, or midline shift. The basilar cisterns are patent. Ventricles: The ventricles are normal. Vascular: Atherosclerotic calcifications of the carotid siphons and intracranial vertebral arteries. No hyperdense vessel. Skull: No acute or aggressive finding. Orbits: Orbits are symmetric. Sinuses: Mucosal thickening in the maxillary sinuses, left greater than right. Other: Mastoid air cells are clear. ASPECTS [facility] Stroke Program Early CT Score) - Ganglionic level infarction (caudate, lentiform nuclei, internal capsule, insula, M1-M3 cortex): 7 - Supraganglionic infarction (M4-M6 cortex): 3 Total score (0-10 with 10 being normal): 10 IMPRESSION: 1. No acute intracranial hemorrhage. 2. Age indeterminate lacunar infarct in the right ventral pons. 3. Chronic microvascular ischemic changes. 4. ASPECTS is 10 Electronically Signed: By: Denny Flack M.D. On: 10/31/2023 14:14    EKG: Independently reviewed.  Atrial fibrillation with rate 99; nonspecific ST changes with no evidence of acute ischemia   Labs on  Admission: I have personally reviewed the available labs and imaging studies at the time of the admission.  Pertinent labs:   None   Assessment and Plan: Principal Problem:   Acute CVA (cerebrovascular accident) (HCC) Active Problems:   Atrial fibrillation, chronic (HCC)   Chronic diastolic CHF (congestive heart failure) (HCC)   HTN (hypertension)   Depression, major, single episode, moderate (HCC)   GERD (gastroesophageal reflux disease)    Assessment and Plan: * Acute CVA (cerebrovascular accident) (HCC) 63 year old presenting to ED with acute on set of trouble speaking and finding words around 11AM found to have age indeterminate lacunar infarct in right ventral pons -admit on telemetry for TIA/stroke work-up -Neurochecks per protocol -Neurology consulted -MRI brain without contrast ordered  -echo done 4 weeks ago with no atrial shunt  -lipid panel and A1C pending, goal LDL less than 70. Continue crestor  (appears to not be taking, restart)  -hx of atrial fib and has been off eliquis  for 1-2 weeks, maybe longer -hold eliquis  until MRI brain results  -Permissive hypertension first 24 hours <220/110 -N.p.o. until bedside swallow screen -PT/ OT/ SLP consult   Atrial fibrillation, chronic (HCC) Rate controlled Hold metoprolol  for now to allow for permissive Htn in settting of acute CVA Has not been on eliqius for at least 1-2 weeks, maybe longer Hold until MRI results   Chronic diastolic CHF (congestive heart failure) (HCC) Euvolemic Stop IVF Echo 09/2023: normal EF. Indeterminate diastolic function. Mildly reduced RVF. Small pericardial effusion.  Strict I/o and daily weights  Continue medical management  HTN (hypertension) Allow for permissive HTN in setting of acute CVA  Prn lopressor  with parameters   Depression, major, single episode, moderate (HCC) Continue wellbutrin  150mg  daily   GERD (gastroesophageal reflux disease) Continue PPI     Advance Care  Planning:   Code Status: Full Code   Consults: tele neurology   DVT Prophylaxis: eliquis  (hold until MRI results)   Family Communication: neighbor at bedside: Damien Raba   Severity of Illness: The appropriate patient status for this patient is INPATIENT. Inpatient status is judged to be reasonable and necessary in order to provide the required intensity of service to ensure the patient's safety. The patient's presenting symptoms, physical exam findings, and initial radiographic and laboratory data in the context of their chronic comorbidities is felt to place them at high risk for further clinical deterioration. Furthermore, it is not anticipated that the patient will be medically stable for discharge from the hospital within 2 midnights of admission.   * I certify that at the point of admission it is my clinical judgment that the patient will require inpatient hospital care spanning beyond 2 midnights from the point of admission due to high intensity of service, high risk for further deterioration and high frequency of surveillance required.*  Author: Raymona Caldwell, MD 10/31/2023 6:02 PM  For on call review [email or web address].

## 2023-10-31 NOTE — Progress Notes (Signed)
 Code Stroke activated @ 1353  EDP at bedside @ 1400  To CT @ 1354.  [facility] paged @ 1357.  Dr. Doretta Gant on camera @ 1409.  LKWT unclear, pt states within the last hour before arriving to the ED.  Expressive aphasia, right facial droop and right sided weakness.  On Eliquis , last dose @ 2100 on 10/30/2023 per patient.  Not a TNK candidate.  TSRN off camera @ 1413.  Pete Brand BSN, Occupational hygienist

## 2023-11-01 DIAGNOSIS — E785 Hyperlipidemia, unspecified: Secondary | ICD-10-CM

## 2023-11-01 DIAGNOSIS — I4891 Unspecified atrial fibrillation: Secondary | ICD-10-CM | POA: Diagnosis not present

## 2023-11-01 DIAGNOSIS — I10 Essential (primary) hypertension: Secondary | ICD-10-CM | POA: Diagnosis not present

## 2023-11-01 DIAGNOSIS — I6340 Cerebral infarction due to embolism of unspecified cerebral artery: Secondary | ICD-10-CM | POA: Diagnosis not present

## 2023-11-01 DIAGNOSIS — I639 Cerebral infarction, unspecified: Secondary | ICD-10-CM | POA: Diagnosis not present

## 2023-11-01 LAB — LIPID PANEL
Cholesterol: 182 mg/dL (ref 0–200)
HDL: 49 mg/dL (ref >40)
LDL Cholesterol: 116 mg/dL — ABNORMAL HIGH (ref 0–99)
Total CHOL/HDL Ratio: 3.7 ratio
Triglycerides: 84 mg/dL (ref <150)
VLDL: 17 mg/dL (ref 0–40)

## 2023-11-01 MED ORDER — PANTOPRAZOLE SODIUM 40 MG PO TBEC
40.0000 mg | DELAYED_RELEASE_TABLET | Freq: Every day | ORAL | Status: DC
  Administered 2023-11-01 – 2023-11-02 (×2): 40 mg via ORAL
  Filled 2023-11-01 (×2): qty 1

## 2023-11-01 MED ORDER — APIXABAN 5 MG PO TABS
5.0000 mg | ORAL_TABLET | Freq: Two times a day (BID) | ORAL | Status: DC
  Administered 2023-11-01 – 2023-11-02 (×3): 5 mg via ORAL
  Filled 2023-11-01 (×3): qty 1

## 2023-11-01 MED ORDER — ROSUVASTATIN CALCIUM 5 MG PO TABS
5.0000 mg | ORAL_TABLET | Freq: Every day | ORAL | Status: DC
  Administered 2023-11-01: 5 mg via ORAL
  Filled 2023-11-01: qty 1

## 2023-11-01 MED ORDER — METOPROLOL TARTRATE 50 MG PO TABS
50.0000 mg | ORAL_TABLET | Freq: Two times a day (BID) | ORAL | Status: DC
  Administered 2023-11-01 – 2023-11-02 (×3): 50 mg via ORAL
  Filled 2023-11-01 (×3): qty 1

## 2023-11-01 MED ORDER — ROSUVASTATIN CALCIUM 20 MG PO TABS
20.0000 mg | ORAL_TABLET | Freq: Every day | ORAL | Status: DC
  Administered 2023-11-02: 20 mg via ORAL
  Filled 2023-11-01: qty 1

## 2023-11-01 MED ORDER — BUPROPION HCL ER (XL) 150 MG PO TB24
150.0000 mg | ORAL_TABLET | Freq: Every day | ORAL | Status: DC
  Administered 2023-11-01 – 2023-11-02 (×2): 150 mg via ORAL
  Filled 2023-11-01 (×2): qty 1

## 2023-11-01 MED ORDER — ORAL CARE MOUTH RINSE: 15.0000 mL | OROMUCOSAL | Status: DC | PRN

## 2023-11-01 NOTE — Progress Notes (Signed)
 PROGRESS NOTE  [name] [name] FMW:[id number] DOB: Jul 18, 1960 DOA: 10/31/2023 PCP: Katrinka Garnette KIDD, MD   LOS: 1 day   Brief Narrative / Interim history: 63 y.o. male with medical history significant of HTN, diastolic CHF, atrial fibrillation on eliquis , psoriasis, depression, HLD, GERD who presented to ED with concerns for stroke. He states symptoms started late morning, around 11am.  He had eaten his breakfast and was watching the news. He was then walking around and he all of a sudden had trouble getting words out. He also has had increased tingling in his right hand.   Subjective / 24h Interval events: He is doing well this morning, feels like his symptoms have almost resolved  Assesement and Plan: Principal Problem:   Acute CVA (cerebrovascular accident) (HCC) Active Problems:   Atrial fibrillation, chronic (HCC)   Chronic diastolic CHF (congestive heart failure) (HCC)   HTN (hypertension)   Depression, major, single episode, moderate (HCC)   GERD (gastroesophageal reflux disease)  Principal problem Acute CVA - patient was admitted to the hospital with expressive aphasia as well as right hand tingling.  MRI on admission showed scattered foci of acute infarct in the cortex and subcortical white matter within the left frontal lobe with involvement in left middle and inferior frontal gyri.  There is additional infarct foci in the right occipital lobe and right temporal lobe.  Neurology was consulted and followed patient while hospitalized.  2D echo was recently done last month and showed LVEF of 60-65%, no WMA. RV systolic function was mildly reduced. Lipid panel showed an LDL of 116, his Crestor  dose was increased as below. He was resumed on his Eliquis  as he missed taking it for the past week. CT angiogram without LVO, did show atherosclerosis at carotid bifurcations without hemodynamically significant stenosis.   Active problems Chronic A-fib, with RVR -patient is in A-fib, has been  having increased rates into the 120s at rest in the setting of not taking his metoprolol  for recent #1.  Discussed with neurology, will resume metoprolol  today  Essential hypertension-resume metoprolol  as above for his A-fib but keep his other home medications on hold  Obesity, class III-BMI 42.  He would benefit from weight loss  Chronic diastolic CHF-recently hospitalized for fluid overload, on furosemide  at home, hold now for today to allow permissive hypertension, start tomorrow  Tobacco, EtOH use-on occasions, not heavy, but recommend complete cessation  Scheduled Meds:  apixaban   5 mg Oral BID   buPROPion   150 mg Oral Daily   pantoprazole   40 mg Oral Daily   [START ON 11/02/2023] rosuvastatin   20 mg Oral Daily   Continuous Infusions: PRN Meds:.acetaminophen  **OR** acetaminophen  (TYLENOL ) oral liquid 160 mg/5 mL **OR** acetaminophen , metoprolol  tartrate, mouth rinse, senna-docusate  Current Outpatient Medications  Medication Instructions   apixaban  (ELIQUIS ) 5 mg, Oral, 2 times daily   buPROPion  (WELLBUTRIN  XL) 150 MG 24 hr tablet TAKE 1 TABLET(150 MG) BY MOUTH DAILY   Cosentyx  UnoReady 300 mg, Subcutaneous, Every 30 days, Starting at week 4, inject one pen monthly   furosemide  (LASIX ) 40 mg, Oral, Daily   lisinopril  (ZESTRIL ) 5 mg, Oral, Daily   metoprolol  tartrate (LOPRESSOR ) 50 mg, Oral, 2 times daily   pantoprazole  (PROTONIX ) 40 mg, Daily   potassium chloride  SA (KLOR-CON  M) 20 MEQ tablet 20 mEq, Oral, Daily   rosuvastatin  (CRESTOR ) 5 mg, Oral, Daily   TYLENOL  1,000 mg, Every 6 hours PRN    Diet Orders (From admission, onward)     Start  Ordered   10/31/23 1703  Diet Heart Room service appropriate? Yes; Fluid consistency: Thin  Diet effective now       Question Answer Comment  Room service appropriate? Yes   Fluid consistency: Thin      10/31/23 1702            DVT prophylaxis: SCD's Start: 10/31/23 1645 apixaban  (ELIQUIS ) tablet 5 mg   Lab Results   Component Value Date   PLT 365 10/31/2023      Code Status: Full Code  Family Communication: no family at bedside   Status is: Inpatient Remains inpatient appropriate because: A fib RVR   Level of care: Telemetry Medical  Consultants:  Neurology   Objective: Vitals:   11/01/23 0350 11/01/23 0405 11/01/23 0725 11/01/23 1132  BP:  (!) 150/90 (!) 153/95 (!) 158/98  Pulse:  97 97 (!) 101  Resp:  18 19 17   Temp:  98.1 F (36.7 C) (!) 97.5 F (36.4 C) 98.3 F (36.8 C)  TempSrc:  Oral Oral Oral  SpO2:  92% 94% 90%  Weight: 130.4 kg     Height:        Intake/Output Summary (Last 24 hours) at 11/01/2023 1300 Last data filed at 10/31/2023 2300 Gross per 24 hour  Intake 300 ml  Output --  Net 300 ml   Wt Readings from Last 3 Encounters:  11/01/23 130.4 kg  10/04/23 129.7 kg  09/12/23 (!) 137.6 kg    Examination:  Constitutional: NAD Eyes: no scleral icterus ENMT: Mucous membranes are moist.  Neck: normal, supple Respiratory: clear to auscultation bilaterally, no wheezing, no crackles. Cardiovascular: irregular, tachycardic. Abdomen: non distended, no tenderness. Bowel sounds positive.  Musculoskeletal: no clubbing / cyanosis.   Data Reviewed: I have independently reviewed following labs and imaging studies   CBC Recent Labs  Lab 10/31/23 1433 10/31/23 1438  WBC 7.8  --   HGB 16.9 17.3*  HCT 51.2 51.0  PLT 365  --   MCV 98.3  --   MCH 32.4  --   MCHC 33.0  --   RDW 12.4  --   LYMPHSABS 2.9  --   MONOABS 0.8  --   EOSABS 0.1  --   BASOSABS 0.1  --     Recent Labs  Lab 10/31/23 1433 10/31/23 1438  NA 140 141  K 4.0 4.0  CL 104 105  CO2 23  --   GLUCOSE 101* 104*  BUN 13 13  CREATININE 0.98 0.90  CALCIUM  9.3  --   AST 22  --   ALT 16  --   ALKPHOS 70  --   BILITOT 0.8  --   ALBUMIN 3.7  --   INR 1.0  --     ------------------------------------------------------------------------------------------------------------------ Recent Labs     11/01/23 0639  CHOL 182  HDL 49  LDLCALC 116*  TRIG 84  CHOLHDL 3.7    Lab Results  Component Value Date   HGBA1C 5.5 10/31/2017   ------------------------------------------------------------------------------------------------------------------ No results for input(s): TSH, T4TOTAL, T3FREE, THYROIDAB in the last 72 hours.  Invalid input(s): FREET3  Cardiac Enzymes No results for input(s): CKMB, TROPONINI, MYOGLOBIN in the last 168 hours.  Invalid input(s): CK ------------------------------------------------------------------------------------------------------------------    Component Value Date/Time   BNP 180.3 (H) 10/01/2023 1216    CBG: Recent Labs  Lab 10/31/23 1403  GLUCAP 107*    No results found for this or any previous visit (from the past 240 hours).   Radiology Studies: CT  ANGIO HEAD NECK W WO CM (CODE STROKE) Addendum Date: 10/31/2023 ADDENDUM REPORT: 10/31/2023 20:11 ADDENDUM: Perfusion maps reviewed. Cerebral blood flow less than 30 percent: 0 mL T-max greater than 6 seconds: 59 mL Mismatch volume: 59 mL Core infarct location: None identified There are scattered areas of elevated T-max throughout the cerebral hemispheres which are mostly favored to be artifactual. However there are a few regions of elevated T-max greater than 8 seconds located within the left frontal lobe as well as within the right temporal occipital lobes which correspond to regions of acute infarct on the same day MRI. Electronically Signed   By: Donnice Mania M.D.   On: 10/31/2023 20:11   Result Date: 10/31/2023 CLINICAL DATA:  Code stroke, neuro deficit, right-sided weakness and aphasia starting 1 hour ago. EXAM: CT ANGIOGRAPHY HEAD AND NECK CT PERFUSION BRAIN TECHNIQUE: Multidetector CT imaging of the head and neck was performed using the standard protocol during bolus administration of intravenous contrast. Multiplanar CT image reconstructions and MIPs were obtained  to evaluate the vascular anatomy. Carotid stenosis measurements (when applicable) are obtained utilizing NASCET criteria, using the distal internal carotid diameter as the denominator. Multiphase CT imaging of the brain was performed following IV bolus contrast injection. Subsequent parametric perfusion maps were calculated using RAPID software. RADIATION DOSE REDUCTION: This exam was performed according to the departmental dose-optimization program which includes automated exposure control, adjustment of the mA and/or kV according to patient size and/or use of iterative reconstruction technique. CONTRAST:  OMNIPAQUE  IOHEXOL  350 MG/ML SOLN COMPARISON:  Same day CT head. FINDINGS: CTA NECK FINDINGS Aortic arch: Common origin of the brachiocephalic and left common carotid arteries. Aortic arch is incompletely visualized with mild atherosclerosis noted. Pulmonary arteries: Not well visualized. Subclavian arteries: Patent bilaterally.  Mild atherosclerosis. Right carotid system: Patent. Mild atherosclerosis at the carotid bifurcation without hemodynamically significant stenosis. No evidence of dissection. Mild tortuosity of the proximal common carotid artery and the mid cervical ICA. Left carotid system: Patent. Mild atherosclerosis at the carotid bifurcation without hemodynamically significant stenosis. No evidence of dissection. Retropharyngeal course of the distal common carotid and proximal cervical ICA. Mild tortuosity of the cervical ICA. Vertebral arteries: Right vertebral artery is dominant. The left vertebral artery originates on the aortic arch with atherosclerosis adjacent to the origin resulting in mild stenosis. Additional atherosclerosis at the right vertebral artery origin resulting in mild stenosis. Right vertebral artery is patent from the origin to the vertebrobasilar confluence. Non dominant left vertebral artery is patent to the intracranial segment and terminates at the left PICA. Skeleton: No  acute findings. Degenerative changes in the cervical spine. Other neck: The visualized airway is patent. No cervical lymphadenopathy. Upper chest: Large left pleural effusion is partially visualized. Patulous thoracic esophagus. Review of the MIP images confirms the above findings CTA HEAD FINDINGS ANTERIOR CIRCULATION: The intracranial ICAs are patent bilaterally. Atherosclerosis of the carotid siphons. Mild stenosis of the right supraclinoid ICA and mild-to-moderate stenosis of the left supraclinoid ICA. No severe stenosis, proximal occlusion, or aneurysm. 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 MCAs: The middle cerebral arteries are patent bilaterally. ACAs: The anterior cerebral arteries are patent bilaterally. POSTERIOR CIRCULATION: No significant stenosis, proximal occlusion, aneurysm, or vascular malformation. PCAs: Patent bilaterally.  Fetal origin of the right PCA. Pcomm: Visualized on the right. SCAs: The superior cerebellar arteries are patent bilaterally. Basilar artery: Patent AICAs: Not well visualized. PICAs: Patent Vertebral arteries: As above. Venous sinuses: As permitted by contrast timing, patent. 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 Fetal origin of the right PCA. Review of the MIP images confirms the above findings CT Brain Perfusion Findings: Difficulty obtaining perfusion maps. IMPRESSION: No large vessel occlusion. Atherosclerosis at the carotid bifurcations without hemodynamically significant stenosis. Mild stenosis at the vertebral artery origins. Atherosclerosis of the carotid siphons resulting in mild stenosis on the right and mild to moderate stenosis on the left. 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 Large left pleural effusion is partially visualized. **Difficulty obtaining perfusion maps. Will dictate addendum when these are available.** Aortic Atherosclerosis (ICD10-I70.0). Electronically Signed: By: Donnice Mania M.D. On: 10/31/2023 15:20    CT CEREBRAL PERFUSION W CONTRAST Addendum Date: 10/31/2023 ADDENDUM REPORT: 10/31/2023 20:11 ADDENDUM: Perfusion maps reviewed. Cerebral blood flow less than 30 percent: 0 mL T-max greater than 6 seconds: 59 mL Mismatch volume: 59 mL Core infarct location: None identified There are scattered areas of elevated T-max throughout the cerebral hemispheres which are mostly favored to be artifactual. However there are a few regions of elevated T-max greater than 8 seconds located within the left frontal lobe as well as within the right temporal occipital lobes which correspond to regions of acute infarct on the same day MRI. Electronically Signed   By: Donnice Mania M.D.   On: 10/31/2023 20:11   Result Date: 10/31/2023 CLINICAL DATA:  Code stroke, neuro deficit, right-sided weakness and aphasia starting 1 hour ago. EXAM: CT ANGIOGRAPHY HEAD AND NECK CT PERFUSION BRAIN TECHNIQUE: Multidetector CT imaging of the head and neck was performed using the standard protocol during bolus administration of intravenous contrast. Multiplanar CT image reconstructions and MIPs were obtained to evaluate the vascular anatomy. Carotid stenosis measurements (when applicable) are obtained utilizing NASCET criteria, using the distal internal carotid diameter as the denominator. Multiphase CT imaging of the brain was performed following IV bolus contrast injection. Subsequent parametric perfusion maps were calculated using RAPID software. RADIATION DOSE REDUCTION: This exam was performed according to the departmental dose-optimization program which includes automated exposure control, adjustment of the mA and/or kV according to patient size and/or use of iterative reconstruction technique. CONTRAST:  OMNIPAQUE  IOHEXOL  350 MG/ML SOLN COMPARISON:  Same day CT head. FINDINGS: CTA NECK FINDINGS Aortic arch: Common origin of the brachiocephalic and left common carotid arteries. Aortic arch is incompletely visualized with mild  atherosclerosis noted. Pulmonary arteries: Not well visualized. Subclavian arteries: Patent bilaterally.  Mild atherosclerosis. Right carotid system: Patent. Mild atherosclerosis at the carotid bifurcation without hemodynamically significant stenosis. No evidence of dissection. Mild tortuosity of the proximal common carotid artery and the mid cervical ICA. Left carotid system: Patent. Mild atherosclerosis at the carotid bifurcation without hemodynamically significant stenosis. No evidence of dissection. Retropharyngeal course of the distal common carotid and proximal cervical ICA. Mild tortuosity of the cervical ICA. Vertebral arteries: Right vertebral artery is dominant. The left vertebral artery originates on the aortic arch with atherosclerosis adjacent to the origin resulting in mild stenosis. Additional atherosclerosis at the right vertebral artery origin resulting in mild stenosis. Right vertebral artery is patent from the origin to the vertebrobasilar confluence. Non dominant left vertebral artery is patent to the intracranial segment and terminates at the left PICA. Skeleton: No acute findings. Degenerative changes in the cervical spine. Other neck: The visualized airway is patent. No cervical lymphadenopathy. Upper chest: Large left pleural effusion is partially visualized. Patulous thoracic esophagus. Review of the MIP images confirms the above findings CTA HEAD FINDINGS ANTERIOR CIRCULATION: The intracranial ICAs are patent bilaterally. Atherosclerosis  of the carotid siphons. Mild stenosis of the right supraclinoid ICA and mild-to-moderate stenosis of the left supraclinoid ICA. No severe stenosis, proximal occlusion, or aneurysm. 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 MCAs: The middle cerebral arteries are patent bilaterally. ACAs: The anterior cerebral arteries are patent bilaterally. POSTERIOR CIRCULATION: No significant stenosis, proximal occlusion, aneurysm, or vascular malformation.  PCAs: Patent bilaterally.  Fetal origin of the right PCA. Pcomm: Visualized on the right. SCAs: The superior cerebellar arteries are patent bilaterally. Basilar artery: Patent AICAs: Not well visualized. PICAs: Patent Vertebral arteries: As above. Venous sinuses: As permitted by contrast timing, patent. 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 Fetal origin of the right PCA. Review of the MIP images confirms the above findings CT Brain Perfusion Findings: Difficulty obtaining perfusion maps. IMPRESSION: No large vessel occlusion. Atherosclerosis at the carotid bifurcations without hemodynamically significant stenosis. Mild stenosis at the vertebral artery origins. Atherosclerosis of the carotid siphons resulting in mild stenosis on the right and mild to moderate stenosis on the left. 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 Large left pleural effusion is partially visualized. **Difficulty obtaining perfusion maps. Will dictate addendum when these are available.** Aortic Atherosclerosis (ICD10-I70.0). Electronically Signed: By: Donnice Mania M.D. On: 10/31/2023 15:20   MR BRAIN WO CONTRAST Result Date: 10/31/2023 CLINICAL DATA:  Neuro deficit, concern for stroke. EXAM: MRI HEAD WITHOUT CONTRAST TECHNIQUE: Multiplanar, multiecho pulse sequences of the brain and surrounding structures were obtained without intravenous contrast. COMPARISON:  Same day CT head and CTA head and neck. FINDINGS: Brain: Scattered foci of acute infarct involving the cortex and subcortical white matter within the left frontal lobe with involvement of the left middle and inferior frontal gyri. Additional foci of acute infarct in the right occipital lobe and right temporal occipital lobes within the cortex and subcortical white matter. Chronic microhemorrhage in the posterior right frontal lobe. Scattered and confluent foci of T2/FLAIR hyperintensity involving the periventricular and subcortical white matter. Signal  changes within the pons likely related to chronic microvascular ischemic changes without lacunar infarct identified. No midline shift. The basilar cisterns are patent. No extra-axial fluid collections. Ventricles: Prominence of the lateral ventricles suggestive of underlying parenchymal volume loss. Vascular: Skull base flow voids are visualized. Skull and upper cervical spine: No focal abnormality. Sinuses/Orbits: Orbits are symmetric. Mucosal thickening in the left maxillary sinus. Other: Mastoid air cells are clear. IMPRESSION: Scattered acute infarcts in the cortex and subcortical white matter of the left frontal lobe as well as the right temporal subtle lobes as above. Moderate chronic microvascular ischemic changes and mild parenchymal volume loss. Signal abnormality in the pons likely related to chronic microvascular ischemic changes without lacunar infarct identified. Electronically Signed   By: Donnice Mania M.D.   On: 10/31/2023 18:46   CT HEAD CODE STROKE WO CONTRAST Addendum Date: 10/31/2023 ADDENDUM REPORT: 10/31/2023 14:25 ADDENDUM: These results were called by telephone at the time of interpretation on 10/31/2023 at 2:21 pm to provider Dr. Lavonia Pat, who verbally acknowledged these results. Electronically Signed   By: Donnice Mania M.D.   On: 10/31/2023 14:25   Result Date: 10/31/2023 CLINICAL DATA:  Code stroke. Neuro deficit, concern for stroke, right-sided weakness. EXAM: CT HEAD WITHOUT CONTRAST TECHNIQUE: Contiguous axial images were obtained from the base of the skull through the vertex without intravenous contrast. RADIATION DOSE REDUCTION: This exam was performed according to the departmental dose-optimization program which includes automated exposure control, adjustment of the mA and/or kV according to patient  size and/or use of iterative reconstruction technique. COMPARISON:  None Available. FINDINGS: Brain: No acute intracranial hemorrhage. No CT evidence of acute infarct. Nonspecific  hypoattenuation in the periventricular and subcortical white matter favored to reflect chronic microvascular ischemic changes. Age indeterminate lacunar infarct in the right ventral pons. No edema, mass effect, or midline shift. The basilar cisterns are patent. Ventricles: The ventricles are normal. Vascular: Atherosclerotic calcifications of the carotid siphons and intracranial vertebral arteries. No hyperdense vessel. Skull: No acute or aggressive finding. Orbits: Orbits are symmetric. Sinuses: Mucosal thickening in the maxillary sinuses, left greater than right. Other: Mastoid air cells are clear. ASPECTS [facility] Stroke Program Early CT Score) - Ganglionic level infarction (caudate, lentiform nuclei, internal capsule, insula, M1-M3 cortex): 7 - Supraganglionic infarction (M4-M6 cortex): 3 Total score (0-10 with 10 being normal): 10 IMPRESSION: 1. No acute intracranial hemorrhage. 2. Age indeterminate lacunar infarct in the right ventral pons. 3. Chronic microvascular ischemic changes. 4. ASPECTS is 10 Electronically Signed: By: Donnice Mania M.D. On: 10/31/2023 14:14    Nilda Fendt, MD, PhD [facility]  Between 7 am - 7 pm I am available, please contact me via Amion (for emergencies) or Securechat (non urgent messages)  Between 7 pm - 7 am I am not available, please contact night coverage MD/APP via Amion

## 2023-11-01 NOTE — Evaluation (Signed)
 Occupational Therapy Evaluation Patient Details Name: [name] [name] MRN: [id number] DOB: [birth year] Today's Date: 11/01/2023   History of Present Illness   Pt is a 63 y.o. M who presents 10/31/2023 with acute onset of trouble speaking. 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  Significant PMH: HTN, diastolic CHF, atrial fibrillation on eliquis , psoriasis, depression, HLD, GERD.     Clinical Impressions PTA, pt was living at home alone, he reports he was independent with ADL/IADL and functional mobility. He reports a fairly sedentary lifestyle. Pt currently demonstrates ability to complete ADL/IADL and functional mobility at independent level. He reports feeling mostly at baseline. Cognition in tact. Pt does endorse feeling emotional about the events leading to admission. Pt would appreciate chaplain referral, notified RN. We discussed resources available for appropriate medication management including delivery options for medications.  Patient evaluated by Occupational Therapy with no further acute OT needs identified. All education has been completed and the patient has no further questions. See below for any follow-up Occupational Therapy or equipment needs. OT to sign off. Thank you for referral.        If plan is discharge home, recommend the following:    none     Functional Status Assessment   Patient has not had a recent decline in their functional status     Equipment Recommendations    none     Recommendations for Other Services    (chaplain services)     Precautions/Restrictions   Precautions Precautions: None Restrictions Weight Bearing Restrictions Per Provider Order: No     Mobility Bed Mobility Overal bed mobility: Independent                  Transfers Overall transfer level: Independent                        Balance  Overall balance assessment: Independent                                         ADL either performed or assessed with clinical judgement   ADL Overall ADL's : Independent                                       General ADL Comments: pt demonstrated ability to complete LB dressing while sitting EOB, ambulated in room without AD.     Vision Patient Visual Report: No change from baseline       Perception         Praxis         Pertinent Vitals/Pain Pain Assessment Pain Assessment: No/denies pain     Extremity/Trunk Assessment Upper Extremity Assessment Upper Extremity Assessment: Overall WFL for tasks assessed   Lower Extremity Assessment Lower Extremity Assessment: Defer to PT evaluation       Communication Communication Communication: No apparent difficulties   Cognition Arousal: Alert Behavior During Therapy: WFL for tasks assessed/performed Cognition: No apparent impairments             OT - Cognition Comments: pt scored a 0/28 on the short blessed test indicating normal cognition.  Following commands: Intact       Cueing  General Comments          Exercises     Shoulder Instructions      Home Living Family/patient expects to be discharged to:: Private residence Living Arrangements: Alone Available Help at Discharge: Neighbor;Friend(s) Type of Home: House Home Access: Level entry     Home Layout: Two level;Laundry or work area in Artist of Steps: 12 Alternate Level Stairs-Rails: Can reach both;Left;Right     Bathroom Toilet: Standard     Home Equipment: None   Additional Comments: Tool shed in garage under house  Lives With: Alone    Prior Functioning/Environment Prior Level of Function : Independent/Modified Independent                    OT Problem List: Decreased activity tolerance   OT Treatment/Interventions:        OT  Goals(Current goals can be found in the care plan section)   Acute Rehab OT Goals Patient Stated Goal: to go home OT Goal Formulation: With patient Time For Goal Achievement: 11/15/23 Potential to Achieve Goals: Good   OT Frequency:       Co-evaluation              AM-PAC OT 6 Clicks Daily Activity     Outcome Measure Help from another person eating meals?: None Help from another person taking care of personal grooming?: None Help from another person toileting, which includes using toliet, bedpan, or urinal?: None Help from another person bathing (including washing, rinsing, drying)?: None Help from another person to put on and taking off regular upper body clothing?: None Help from another person to put on and taking off regular lower body clothing?: None 6 Click Score: 24   End of Session Nurse Communication: Mobility status;Other (comment) (request for chaplain services)  Activity Tolerance: Patient tolerated treatment well Patient left: in bed;with nursing/sitter in room  OT Visit Diagnosis: Other abnormalities of gait and mobility (R26.89)                Time: 8567-8550 OT Time Calculation (min): 17 min Charges:  OT General Charges $OT Visit: 1 Visit OT Evaluation $OT Eval Low Complexity: 1 Low  Yohan Samons OTR/L Acute Rehabilitation Services Office: [phone]   Verneita ONEIDA Moose 11/01/2023, 3:45 PM

## 2023-11-01 NOTE — Evaluation (Signed)
 Speech Language Pathology Evaluation Patient Details Name: [name] [name] MRN: [id number] DOB: [birth year] Today's Date: 11/01/2023 Time: 1230-1300 SLP Time Calculation (min) (ACUTE ONLY): 30 min  Problem List:  Patient Active Problem List   Diagnosis Date Noted   Chronic diastolic CHF (congestive heart failure) (HCC) 10/31/2023   Atrial fibrillation, chronic (HCC) 10/31/2023   GERD (gastroesophageal reflux disease) 10/31/2023   Acute CVA (cerebrovascular accident) (HCC) 10/31/2023   Acute diastolic CHF (congestive heart failure) (HCC) 10/01/2023   Hypoxia 09/12/2023   Erythrocytosis 09/12/2023   Chest pain 09/12/2023   Atrial fibrillation with rapid ventricular response (HCC) 07/29/2023   Impetigo any site 12/28/2021   Intertrigo 12/28/2021   Boil 12/28/2021   Sweaty armpits 12/28/2021   Depression, major, single episode, moderate (HCC) 12/21/2019   Rhinitis, chronic 01/23/2018   ETD (Eustachian tube dysfunction), bilateral 01/23/2018   Bilateral impacted cerumen 01/23/2018   Morbid obesity (HCC) 11/06/2017   Hx of adenomatous colonic polyps 10/19/2017   Diverticulitis    Stress fracture 07/04/2014   Right-sided thoracic back pain 11/28/2013   HTN (hypertension) 05/09/2012   Past Medical History:  Past Medical History:  Diagnosis Date   Allergy    Boil 12/28/2021   Offered to lance, he declined Will use doxy for impetigo and he will use warm compresses.   Diverticulitis    leading to approx 1 foot resection-colectomy   History of kidney stones    Hx of adenomatous colonic polyps 10/19/2017   Hypertension    Impetigo any site 12/28/2021   Intertrigo 12/28/2021   NEPHROLITHIASIS, HX OF 03/09/2007   PONV (postoperative nausea and vomiting)    SPRAIN/STRAIN, ANKLE NOS 01/05/2007   Sweaty armpits 12/28/2021   Past Surgical History:  Past Surgical History:  Procedure Laterality Date   COLECTOMY  09/2008   Dr Mikell   COLONOSCOPY     HERNIA REPAIR  09/18/12   lap  incisional hernia repair, related to colectomy   VENTRAL HERNIA REPAIR N/A 09/18/2012   HPI:  63 y.o. M who presents 10/31/2023 with acute onset of trouble speaking. 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  Significant PMH: HTN, diastolic CHF, atrial fibrillation on eliquis , psoriasis, depression, HLD, GERD   Assessment / Plan / Recommendation Clinical Impression  Pt presents with a minimal expressive aphasia of speech. He notes his symptoms have almost resolved since hospitalization. Mild hesitations and pauses noted intermittently during spontaneous speech. Pt remained intellgible with communciation and was able to converse at the conversational level. Cognitive abilibites appeared intact portions of the [facility] Mental Status given as well as informal measures. Pt with good recall to hospital events. Discussed stroke symptoms including BE FAST. Receptive language and motor speech skills were intact. Pt lives alone locally, though has supportive neighbors and friends nearby. He also notes supportive siblings out of state. He verbalized fear of going home and needing one more night to prepare for his discharge which he communicated to staff. No further ST needs identified.    SLP Assessment  SLP Recommendation/Assessment: Patient does not need any further Speech Language Pathology Services SLP Visit Diagnosis: Aphasia (R47.01)     Assistance Recommended at Discharge  None  Functional Status Assessment Patient has not had a recent decline in their functional status  Frequency and Duration           SLP Evaluation Cognition  Overall Cognitive Status:  Within Functional Limits for tasks assessed Arousal/Alertness: Awake/alert Orientation Level: Oriented X4 Year: 2025 Attention: Focused;Sustained Focused Attention: Appears intact Sustained Attention: Appears intact Memory:  Appears intact Awareness: Appears intact Problem Solving: Appears intact Executive Function: Self Monitoring;Organizing Organizing: Appears intact Self Monitoring: Appears intact Safety/Judgment: Appears intact       Comprehension  Auditory Comprehension Overall Auditory Comprehension: Appears within functional limits for tasks assessed    Expression Expression Primary Mode of Expression: Verbal Verbal Expression Overall Verbal Expression: Impaired (expressive aphasia close to normal since CVA onset ~90% per patient) Initiation: No impairment Level of Generative/Spontaneous Verbalization: Conversation Repetition: No impairment Pragmatics: No impairment Written Expression Dominant Hand: Right Written Expression: Within Functional Limits   Oral / Motor  Motor Speech Overall Motor Speech: Appears within functional limits for tasks assessed            Mitzie HUNT MA, CCC-SLP Acute Rehabilitation Services   11/01/2023, 1:13 PM

## 2023-11-01 NOTE — Evaluation (Addendum)
 Physical Therapy Brief Evaluation and Discharge Note Patient Details Name: [name] [name] MRN: [id number] DOB: 11-Sep-1960 Today's Date: 11/01/2023   History of Present Illness  Pt is a 63 y.o. M who presents 10/31/2023 with acute onset of trouble speaking. 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  Significant PMH: HTN, diastolic CHF, atrial fibrillation on eliquis , psoriasis, depression, HLD, GERD.  Clinical Impression  Patient evaluated by Physical Therapy with no further acute PT needs identified. PTA, pt lives alone and is independent, although reports being fairly sedentary at baseline. Pt reports resolution of R hand tingling and states that speech is much improved. Pt displays truncal lateral shift, but unable to verbalize if this is present at baseline. Pt ambulating 300 ft with no assistive device and negotiated steps independently. Does display decreased cardiopulmonary endurance overall (reports mild SOB; SpO2 > 92% on RA) and dynamic balance deficits, with gait deviations with walking with eyes closed and walking with narrow base of support. Would benefit from follow up OPPT to address deficits and improving cardiovascular health. Education provided regarding BEFAST stroke signs and symptoms. All education has been completed and the patient has no further questions. PT is signing off. Thank you for this referral.      PT Assessment All further PT needs can be met in the next venue of care  Assistance Needed at Discharge  None    Equipment Recommendations None recommended by PT  Recommendations for Other Services       Precautions/Restrictions Precautions Precautions: None Restrictions Weight Bearing Restrictions Per Provider Order: No        Mobility  Bed Mobility   Supine/Sidelying to sit: Independent      Transfers Overall transfer level: Independent                       Ambulation/Gait Ambulation/Gait assistance: Independent Gait Distance (Feet): 300 Feet Assistive device: None Gait Pattern/deviations: WFL(Within Functional Limits)      Home Activity Instructions    Stairs Stairs: Yes Stairs assistance: Modified independent (Device/Increase time) Stair Management: No rails, Two rails Number of Stairs: 3    Modified Rankin (Stroke Patients Only) Modified Rankin (Stroke Patients Only) Pre-Morbid Rankin Score: No symptoms Modified Rankin: No significant disability      Balance Overall balance assessment: Mild deficits observed, not formally tested                        Pertinent Vitals/Pain PT - Brief Vital Signs All Vital Signs Stable: Yes Pain Assessment Pain Assessment: No/denies pain     Home Living Family/patient expects to be discharged to:: Private residence Living Arrangements: Alone Available Help at Discharge: Neighbor;Friend(s) Home Environment: Stairs to enter  [employer] of Steps: 2 Home Equipment: None   Additional Comments: Tool shed in garage under house    Prior Function Level of Independence: Independent      UE/LE Assessment   UE ROM/Strength/Tone/Coordination: [facility]    LE ROM/Strength/Tone/Coordination: [facility]      Communication   Communication Communication: No apparent difficulties     Cognition Overall Cognitive Status: Appears within functional limits for tasks assessed/performed       General Comments      Exercises     Assessment/Plan    PT Problem List Decreased activity tolerance;Decreased balance;Decreased mobility       PT Visit Diagnosis  Unsteadiness on feet (R26.81)    No Skilled PT     Co-evaluation                AMPAC 6 Clicks Help needed turning from your back to your side while in a flat bed without using bedrails?: None Help needed moving from lying on your back to sitting on the side of a flat bed without using bedrails?:  None Help needed moving to and from a bed to a chair (including a wheelchair)?: None Help needed standing up from a chair using your arms (e.g., wheelchair or bedside chair)?: None Help needed to walk in hospital room?: None Help needed climbing 3-5 steps with a railing? : None 6 Click Score: 24      End of Session   Activity Tolerance: Patient tolerated treatment well Patient left: in bed;with call bell/phone within reach   PT Visit Diagnosis: Unsteadiness on feet (R26.81)     Time: 9092-9069 PT Time Calculation (min) (ACUTE ONLY): 23 min  Charges:   PT Evaluation $PT Eval Low Complexity: 1 Low      Aleck Daring, PT, DPT Acute Rehabilitation Services Office [phone]   Alayne ONEIDA Daring  11/01/2023, 10:52 AM

## 2023-11-01 NOTE — Progress Notes (Signed)
 STROKE TEAM PROGRESS NOTE   SUBJECTIVE (INTERVAL HISTORY) No family is at the bedside.  Overall his condition is completely resolved.  Patient stated that yesterday he had acute onset aphasia and right facial droop but now all resolved.  Still has A-fib RVR, will resume metoprolol  and Eliquis .   OBJECTIVE Temp:  [97.5 F (36.4 C)-98.3 F (36.8 C)] 98.3 F (36.8 C) (06/21 1132) Pulse Rate:  [93-106] 101 (06/21 1132) Cardiac Rhythm: Atrial fibrillation (06/21 0700) Resp:  [16-23] 17 (06/21 1132) BP: (150-188)/(90-117) 158/98 (06/21 1132) SpO2:  [90 %-96 %] 90 % (06/21 1132) Weight:  [130.4 kg] 130.4 kg (06/21 0350)  Recent Labs  Lab 10/31/23 1403  GLUCAP 107*   Recent Labs  Lab 10/31/23 1433 10/31/23 1438  NA 140 141  K 4.0 4.0  CL 104 105  CO2 23  --   GLUCOSE 101* 104*  BUN 13 13  CREATININE 0.98 0.90  CALCIUM  9.3  --    Recent Labs  Lab 10/31/23 1433  AST 22  ALT 16  ALKPHOS 70  BILITOT 0.8  PROT 7.9  ALBUMIN 3.7   Recent Labs  Lab 10/31/23 1433 10/31/23 1438  WBC 7.8  --   NEUTROABS 4.0  --   HGB 16.9 17.3*  HCT 51.2 51.0  MCV 98.3  --   PLT 365  --    No results for input(s): CKTOTAL, CKMB, CKMBINDEX, TROPONINI in the last 168 hours. Recent Labs    10/31/23 1433  LABPROT 13.6  INR 1.0   No results for input(s): COLORURINE, LABSPEC, PHURINE, GLUCOSEU, HGBUR, BILIRUBINUR, KETONESUR, PROTEINUR, UROBILINOGEN, NITRITE, LEUKOCYTESUR in the last 72 hours.  Invalid input(s): APPERANCEUR     Component Value Date/Time   CHOL 182 11/01/2023 0639   TRIG 84 11/01/2023 0639   HDL 49 11/01/2023 0639   CHOLHDL 3.7 11/01/2023 0639   VLDL 17 11/01/2023 0639   LDLCALC 116 (H) 11/01/2023 0639   LDLCALC 116 (H) 03/16/2021 1503   Lab Results  Component Value Date   HGBA1C 5.5 10/31/2017      Component Value Date/Time   LABOPIA NONE DETECTED 10/31/2023 1513   COCAINSCRNUR NONE DETECTED 10/31/2023 1513   LABBENZ NONE  DETECTED 10/31/2023 1513   AMPHETMU NONE DETECTED 10/31/2023 1513   THCU NONE DETECTED 10/31/2023 1513   LABBARB NONE DETECTED 10/31/2023 1513    Recent Labs  Lab 10/31/23 1433  ETH <15    I have personally reviewed the radiological images below and agree with the radiology interpretations.  CT ANGIO HEAD NECK W WO CM (CODE STROKE) Addendum Date: 10/31/2023 ADDENDUM REPORT: 10/31/2023 20:11 ADDENDUM: Perfusion maps reviewed. Cerebral blood flow less than 30 percent: 0 mL T-max greater than 6 seconds: 59 mL Mismatch volume: 59 mL Core infarct location: None identified There are scattered areas of elevated T-max throughout the cerebral hemispheres which are mostly favored to be artifactual. However there are a few regions of elevated T-max greater than 8 seconds located within the left frontal lobe as well as within the right temporal occipital lobes which correspond to regions of acute infarct on the same day MRI. Electronically Signed   By: Donnice Mania M.D.   On: 10/31/2023 20:11   Result Date: 10/31/2023 CLINICAL DATA:  Code stroke, neuro deficit, right-sided weakness and aphasia starting 1 hour ago. EXAM: CT ANGIOGRAPHY HEAD AND NECK CT PERFUSION BRAIN TECHNIQUE: Multidetector CT imaging of the head and neck was performed using the standard protocol during bolus administration of intravenous contrast. Multiplanar  CT image reconstructions and MIPs were obtained to evaluate the vascular anatomy. Carotid stenosis measurements (when applicable) are obtained utilizing NASCET criteria, using the distal internal carotid diameter as the denominator. Multiphase CT imaging of the brain was performed following IV bolus contrast injection. Subsequent parametric perfusion maps were calculated using RAPID software. RADIATION DOSE REDUCTION: This exam was performed according to the departmental dose-optimization program which includes automated exposure control, adjustment of the mA and/or kV according to  patient size and/or use of iterative reconstruction technique. CONTRAST:  OMNIPAQUE  IOHEXOL  350 MG/ML SOLN COMPARISON:  Same day CT head. FINDINGS: CTA NECK FINDINGS Aortic arch: Common origin of the brachiocephalic and left common carotid arteries. Aortic arch is incompletely visualized with mild atherosclerosis noted. Pulmonary arteries: Not well visualized. Subclavian arteries: Patent bilaterally.  Mild atherosclerosis. Right carotid system: Patent. Mild atherosclerosis at the carotid bifurcation without hemodynamically significant stenosis. No evidence of dissection. Mild tortuosity of the proximal common carotid artery and the mid cervical ICA. Left carotid system: Patent. Mild atherosclerosis at the carotid bifurcation without hemodynamically significant stenosis. No evidence of dissection. Retropharyngeal course of the distal common carotid and proximal cervical ICA. Mild tortuosity of the cervical ICA. Vertebral arteries: Right vertebral artery is dominant. The left vertebral artery originates on the aortic arch with atherosclerosis adjacent to the origin resulting in mild stenosis. Additional atherosclerosis at the right vertebral artery origin resulting in mild stenosis. Right vertebral artery is patent from the origin to the vertebrobasilar confluence. Non dominant left vertebral artery is patent to the intracranial segment and terminates at the left PICA. Skeleton: No acute findings. Degenerative changes in the cervical spine. Other neck: The visualized airway is patent. No cervical lymphadenopathy. Upper chest: Large left pleural effusion is partially visualized. Patulous thoracic esophagus. Review of the MIP images confirms the above findings CTA HEAD FINDINGS ANTERIOR CIRCULATION: The intracranial ICAs are patent bilaterally. Atherosclerosis of the carotid siphons. Mild stenosis of the right supraclinoid ICA and mild-to-moderate stenosis of the left supraclinoid ICA. No severe stenosis, proximal  occlusion, or aneurysm. 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 MCAs: The middle cerebral arteries are patent bilaterally. ACAs: The anterior cerebral arteries are patent bilaterally. POSTERIOR CIRCULATION: No significant stenosis, proximal occlusion, aneurysm, or vascular malformation. PCAs: Patent bilaterally.  Fetal origin of the right PCA. Pcomm: Visualized on the right. SCAs: The superior cerebellar arteries are patent bilaterally. Basilar artery: Patent AICAs: Not well visualized. PICAs: Patent Vertebral arteries: As above. Venous sinuses: As permitted by contrast timing, patent. 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 Fetal origin of the right PCA. Review of the MIP images confirms the above findings CT Brain Perfusion Findings: Difficulty obtaining perfusion maps. IMPRESSION: No large vessel occlusion. Atherosclerosis at the carotid bifurcations without hemodynamically significant stenosis. Mild stenosis at the vertebral artery origins. Atherosclerosis of the carotid siphons resulting in mild stenosis on the right and mild to moderate stenosis on the left. 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 Large left pleural effusion is partially visualized. **Difficulty obtaining perfusion maps. Will dictate addendum when these are available.** Aortic Atherosclerosis (ICD10-I70.0). Electronically Signed: By: Donnice Mania M.D. On: 10/31/2023 15:20   CT CEREBRAL PERFUSION W CONTRAST Addendum Date: 10/31/2023 ADDENDUM REPORT: 10/31/2023 20:11 ADDENDUM: Perfusion maps reviewed. Cerebral blood flow less than 30 percent: 0 mL T-max greater than 6 seconds: 59 mL Mismatch volume: 59 mL Core infarct location: None identified There are scattered areas of elevated T-max throughout the cerebral hemispheres which are mostly favored to  be artifactual. However there are a few regions of elevated T-max greater than 8 seconds located within the left frontal lobe as well as within the right  temporal occipital lobes which correspond to regions of acute infarct on the same day MRI. Electronically Signed   By: Donnice Mania M.D.   On: 10/31/2023 20:11   Result Date: 10/31/2023 CLINICAL DATA:  Code stroke, neuro deficit, right-sided weakness and aphasia starting 1 hour ago. EXAM: CT ANGIOGRAPHY HEAD AND NECK CT PERFUSION BRAIN TECHNIQUE: Multidetector CT imaging of the head and neck was performed using the standard protocol during bolus administration of intravenous contrast. Multiplanar CT image reconstructions and MIPs were obtained to evaluate the vascular anatomy. Carotid stenosis measurements (when applicable) are obtained utilizing NASCET criteria, using the distal internal carotid diameter as the denominator. Multiphase CT imaging of the brain was performed following IV bolus contrast injection. Subsequent parametric perfusion maps were calculated using RAPID software. RADIATION DOSE REDUCTION: This exam was performed according to the departmental dose-optimization program which includes automated exposure control, adjustment of the mA and/or kV according to patient size and/or use of iterative reconstruction technique. CONTRAST:  OMNIPAQUE  IOHEXOL  350 MG/ML SOLN COMPARISON:  Same day CT head. FINDINGS: CTA NECK FINDINGS Aortic arch: Common origin of the brachiocephalic and left common carotid arteries. Aortic arch is incompletely visualized with mild atherosclerosis noted. Pulmonary arteries: Not well visualized. Subclavian arteries: Patent bilaterally.  Mild atherosclerosis. Right carotid system: Patent. Mild atherosclerosis at the carotid bifurcation without hemodynamically significant stenosis. No evidence of dissection. Mild tortuosity of the proximal common carotid artery and the mid cervical ICA. Left carotid system: Patent. Mild atherosclerosis at the carotid bifurcation without hemodynamically significant stenosis. No evidence of dissection. Retropharyngeal course of the distal  common carotid and proximal cervical ICA. Mild tortuosity of the cervical ICA. Vertebral arteries: Right vertebral artery is dominant. The left vertebral artery originates on the aortic arch with atherosclerosis adjacent to the origin resulting in mild stenosis. Additional atherosclerosis at the right vertebral artery origin resulting in mild stenosis. Right vertebral artery is patent from the origin to the vertebrobasilar confluence. Non dominant left vertebral artery is patent to the intracranial segment and terminates at the left PICA. Skeleton: No acute findings. Degenerative changes in the cervical spine. Other neck: The visualized airway is patent. No cervical lymphadenopathy. Upper chest: Large left pleural effusion is partially visualized. Patulous thoracic esophagus. Review of the MIP images confirms the above findings CTA HEAD FINDINGS ANTERIOR CIRCULATION: The intracranial ICAs are patent bilaterally. Atherosclerosis of the carotid siphons. Mild stenosis of the right supraclinoid ICA and mild-to-moderate stenosis of the left supraclinoid ICA. No severe stenosis, proximal occlusion, or aneurysm. 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 MCAs: The middle cerebral arteries are patent bilaterally. ACAs: The anterior cerebral arteries are patent bilaterally. POSTERIOR CIRCULATION: No significant stenosis, proximal occlusion, aneurysm, or vascular malformation. PCAs: Patent bilaterally.  Fetal origin of the right PCA. Pcomm: Visualized on the right. SCAs: The superior cerebellar arteries are patent bilaterally. Basilar artery: Patent AICAs: Not well visualized. PICAs: Patent Vertebral arteries: As above. Venous sinuses: As permitted by contrast timing, patent. 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 Fetal origin of the right PCA. Review of the MIP images confirms the above findings CT Brain Perfusion Findings: Difficulty obtaining perfusion maps. IMPRESSION: No large vessel  occlusion. Atherosclerosis at the carotid bifurcations without hemodynamically significant stenosis. Mild stenosis at the vertebral artery origins. Atherosclerosis of the carotid siphons resulting in mild  stenosis on the right and mild to moderate stenosis on the left. 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 Large left pleural effusion is partially visualized. **Difficulty obtaining perfusion maps. Will dictate addendum when these are available.** Aortic Atherosclerosis (ICD10-I70.0). Electronically Signed: By: Donnice Mania M.D. On: 10/31/2023 15:20   MR BRAIN WO CONTRAST Result Date: 10/31/2023 CLINICAL DATA:  Neuro deficit, concern for stroke. EXAM: MRI HEAD WITHOUT CONTRAST TECHNIQUE: Multiplanar, multiecho pulse sequences of the brain and surrounding structures were obtained without intravenous contrast. COMPARISON:  Same day CT head and CTA head and neck. FINDINGS: Brain: Scattered foci of acute infarct involving the cortex and subcortical white matter within the left frontal lobe with involvement of the left middle and inferior frontal gyri. Additional foci of acute infarct in the right occipital lobe and right temporal occipital lobes within the cortex and subcortical white matter. Chronic microhemorrhage in the posterior right frontal lobe. Scattered and confluent foci of T2/FLAIR hyperintensity involving the periventricular and subcortical white matter. Signal changes within the pons likely related to chronic microvascular ischemic changes without lacunar infarct identified. No midline shift. The basilar cisterns are patent. No extra-axial fluid collections. Ventricles: Prominence of the lateral ventricles suggestive of underlying parenchymal volume loss. Vascular: Skull base flow voids are visualized. Skull and upper cervical spine: No focal abnormality. Sinuses/Orbits: Orbits are symmetric. Mucosal thickening in the left maxillary sinus. Other: Mastoid air cells are clear. IMPRESSION: Scattered  acute infarcts in the cortex and subcortical white matter of the left frontal lobe as well as the right temporal subtle lobes as above. Moderate chronic microvascular ischemic changes and mild parenchymal volume loss. Signal abnormality in the pons likely related to chronic microvascular ischemic changes without lacunar infarct identified. Electronically Signed   By: Donnice Mania M.D.   On: 10/31/2023 18:46   CT HEAD CODE STROKE WO CONTRAST Addendum Date: 10/31/2023 ADDENDUM REPORT: 10/31/2023 14:25 ADDENDUM: These results were called by telephone at the time of interpretation on 10/31/2023 at 2:21 pm to provider Dr. Lavonia Pat, who verbally acknowledged these results. Electronically Signed   By: Donnice Mania M.D.   On: 10/31/2023 14:25   Result Date: 10/31/2023 CLINICAL DATA:  Code stroke. Neuro deficit, concern for stroke, right-sided weakness. EXAM: CT HEAD WITHOUT CONTRAST TECHNIQUE: Contiguous axial images were obtained from the base of the skull through the vertex without intravenous contrast. RADIATION DOSE REDUCTION: This exam was performed according to the departmental dose-optimization program which includes automated exposure control, adjustment of the mA and/or kV according to patient size and/or use of iterative reconstruction technique. COMPARISON:  None Available. FINDINGS: Brain: No acute intracranial hemorrhage. No CT evidence of acute infarct. Nonspecific hypoattenuation in the periventricular and subcortical white matter favored to reflect chronic microvascular ischemic changes. Age indeterminate lacunar infarct in the right ventral pons. No edema, mass effect, or midline shift. The basilar cisterns are patent. Ventricles: The ventricles are normal. Vascular: Atherosclerotic calcifications of the carotid siphons and intracranial vertebral arteries. No hyperdense vessel. Skull: No acute or aggressive finding. Orbits: Orbits are symmetric. Sinuses: Mucosal thickening in the maxillary sinuses,  left greater than right. Other: Mastoid air cells are clear. ASPECTS [facility] Stroke Program Early CT Score) - Ganglionic level infarction (caudate, lentiform nuclei, internal capsule, insula, M1-M3 cortex): 7 - Supraganglionic infarction (M4-M6 cortex): 3 Total score (0-10 with 10 being normal): 10 IMPRESSION: 1. No acute intracranial hemorrhage. 2. Age indeterminate lacunar infarct in the right ventral pons. 3. Chronic microvascular ischemic changes. 4. ASPECTS is 10 Electronically  Signed: By: Donnice Mania M.D. On: 10/31/2023 14:14     PHYSICAL EXAM  Temp:  [97.5 F (36.4 C)-98.3 F (36.8 C)] 98.3 F (36.8 C) (06/21 1132) Pulse Rate:  [93-106] 101 (06/21 1132) Resp:  [16-23] 17 (06/21 1132) BP: (150-188)/(90-117) 158/98 (06/21 1132) SpO2:  [90 %-96 %] 90 % (06/21 1132) Weight:  [130.4 kg] 130.4 kg (06/21 0350)  General - Well nourished, well developed, in no apparent distress.  Ophthalmologic - fundi not visualized due to noncooperation.  Cardiovascular - Regular rhythm and rate.  Mental Status -  Level of arousal and orientation to time, place, and person were intact. Language including expression, naming, repetition, comprehension was assessed and found intact. Fund of Knowledge was assessed and was intact.  Cranial Nerves II - XII - II - Visual field intact OU. III, IV, VI - Extraocular movements intact. V - Facial sensation intact bilaterally. VII - Facial movement intact bilaterally. VIII - Hearing & vestibular intact bilaterally. X - Palate elevates symmetrically. XI - Chin turning & shoulder shrug intact bilaterally. XII - Tongue protrusion intact.  Motor Strength - The patient's strength was normal in all extremities and pronator drift was absent.  Bulk was normal and fasciculations were absent.   Motor Tone - Muscle tone was assessed at the neck and appendages and was normal.  Reflexes - The patient's reflexes were symmetrical in all extremities and he had no  pathological reflexes.  Sensory - Light touch, temperature/pinprick were assessed and were symmetrical.    Coordination - The patient had normal movements in the hands and feet with no ataxia or dysmetria.  Tremor was absent.  Gait and Station - deferred.   ASSESSMENT/PLAN Mr. [name] [name] is a 63 y.o. male with history of hypertension, hyperlipidemia, psoriasis, recently diagnosed Afib not on eliquis  for 2 weeks admitted for right facial droop and aphasia. No TNK given due to symptom resolved.    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  Patient counseled to be compliant with his antithrombotic medications Ongoing aggressive stroke risk factor management Therapy recommendations: Outpatient PT Disposition: Pending  A-fib RVR Off Eliquis  for 2 weeks at home Intermittent RVR Resume home metoprolol  Resume home Eliquis  Medication compliance education provided  Hypertension Stable Long term BP goal normotensive  Hyperlipidemia Home meds: Crestor  5 LDL 116, goal < 70 Now on Crestor  20 Continue statin at discharge  Other Stroke Risk Factors Advanced age Obesity, Body mass index is 42.45 kg/m.   Other Active Problems   Hospital day # 1  Neurology will sign off. Please call with questions. Pt will follow up with stroke clinic NP at [facility] in about 4 weeks. Thanks for the consult.   Ary Cummins, MD PhD Stroke Neurology 11/01/2023 12:40 PM    To contact Stroke Continuity provider, please refer to [email or web address]. After hours, contact General Neurology

## 2023-11-01 NOTE — Discharge Summary (Signed)
 Physician Discharge Summary  [name] [name] FMW:[id number] DOB: 06-26-1960 DOA: 10/31/2023  PCP: Katrinka Garnette KIDD, MD  Admit date: 10/31/2023 Discharge date: 11/02/2023  Admitted From: home Disposition:  home  Recommendations for Outpatient Follow-up:  Follow up with PCP in 1-2 weeks  Home Health: none Equipment/Devices: none  Discharge Condition: stable CODE STATUS: Full code Diet Orders (From admission, onward)     Start     Ordered   10/31/23 1703  Diet Heart Room service appropriate? Yes; Fluid consistency: Thin  Diet effective now       Question Answer Comment  Room service appropriate? Yes   Fluid consistency: Thin      10/31/23 1702            HPI: Per admitting MD, [name] [name] is a 63 y.o. male with medical history significant of HTN, diastolic CHF, atrial fibrillation on eliquis , psoriasis, depression, HLD, GERD who presented to ED with concerns for stroke. He states symptoms started late morning, around 11am.  He had eaten his breakfast and was watching the news. He was then walking around and he all of a sudden had trouble getting words out. He knew what he wanted to say, but the word wouldn't come out and sounds garbled. He also has had increased tingling in his right hand. He denies any facial drooping or extremity weakness. He is right handed.  He was admitted in May for newly diagnosed atrial fibrillation and then again on 5/21 for acute diastolic CHF. He was discharged with eliquis , but has run out and is unsure how long he has been out of this for, but likely 2+ weeks. He has been feeling good. Denies any fever/chills, vision changes/headaches, chest pain or palpitations, shortness of breath or cough, abdominal pain, N/V/D, dysuria or leg swelling.  Admitted 10/01/23 for acute diastolic CHF exacerbation and some concerns for pericarditis. Completed 7 day course of colchicine  and ibuprofen . He socially smokes and drinks occasionally  Hospital Course / Discharge  diagnoses: Principal Problem:   Acute CVA (cerebrovascular accident) (HCC) Active Problems:   Atrial fibrillation, chronic (HCC)   Chronic diastolic CHF (congestive heart failure) (HCC)   HTN (hypertension)   Depression, major, single episode, moderate (HCC)   GERD (gastroesophageal reflux disease)  Principal problem Acute CVA (cerebrovascular accident) [facility]) - 63 year old presenting to ED with acute onset of difficulty speaking.  MRI of the brain on admission showed scattered acute infarcts in the cortex and subcortical white matter of the left frontal lobe as well as right temporal lobes.  Neurology consulted and followed patient while hospitalized.  2D echo was recently done last month and showed LVEF of 60-65%, no WMA.  RV systolic function was mildly reduced.  Lipid panel showed an LDL of 116, his Crestor  dose was increased as below.  He was resumed on his Eliquis  as he missed taking it for the past week.  CT angiogram without LVO, did show atherosclerosis at carotid bifurcations without hemodynamically significant stenosis.  PT recommends outpatient PT. He improved and is now back to baseline, will be discharged home in stable condition with outpatient follow-up   Active problems Atrial fibrillation, chronic (HCC) -had RVR initially because metoprolol  was skipped due to allowing permissive hypertension, upon resumption he is now rate controlled.  Outpatient follow-up with cardiology Chronic diastolic CHF (congestive heart failure) (HCC) - Euvolemic.  Resume home medications HTN-resume home medications Depression - Continue wellbutrin  150mg  daily  GERD (gastroesophageal reflux disease) - Continue PPI  Sepsis ruled out   Discharge Instructions  Discharge Instructions     Ambulatory referral to Neurology   Complete by: As directed    Follow up with stroke clinic NP at [facility] in about 4-6 weeks. Thanks.      Allergies as of 11/02/2023       Reactions   Benadryl  [diphenhydramine   Hcl] Itching, Other (See Comments)   Patient stated allergy after ordered by MD   Morphine And Codeine Itching        Medication List     TAKE these medications    apixaban  5 MG Tabs tablet Commonly known as: ELIQUIS  Take 1 tablet (5 mg total) by mouth 2 (two) times daily.   buPROPion  150 MG 24 hr tablet Commonly known as: WELLBUTRIN  XL TAKE 1 TABLET(150 MG) BY MOUTH DAILY   Cosentyx  UnoReady 300 MG/2ML Soaj Generic drug: Secukinumab  Inject 300 mg into the skin every 30 (thirty) days. Starting at week 4, inject one pen monthly   furosemide  40 MG tablet Commonly known as: Lasix  Take 1 tablet (40 mg total) by mouth daily.   lisinopril  5 MG tablet Commonly known as: ZESTRIL  Take 1 tablet (5 mg total) by mouth daily.   metoprolol  tartrate 50 MG tablet Commonly known as: LOPRESSOR  Take 1 tablet (50 mg total) by mouth 2 (two) times daily.   pantoprazole  40 MG tablet Commonly known as: PROTONIX  Take 40 mg by mouth daily.   potassium chloride  SA 20 MEQ tablet Commonly known as: KLOR-CON  M Take 1 tablet (20 mEq total) by mouth daily.   rosuvastatin  20 MG tablet Commonly known as: CRESTOR  Take 1 tablet (20 mg total) by mouth daily. Start taking on: November 03, 2023 What changed:  medication strength how much to take   TYLENOL  500 MG tablet Generic drug: acetaminophen  Take 1,000 mg by mouth every 6 (six) hours as needed for mild pain (pain score 1-3) or headache.        Follow-up Information     [facility]. Schedule an appointment as soon as possible for a visit in 1 month(s).   Specialty: Neurology Why: stroke clinic Contact information: [address] [place]  [id number] [phone]                Consultations: Neurology   Procedures/Studies:  CT ANGIO HEAD NECK W WO CM (CODE STROKE) Addendum Date: 10/31/2023 ADDENDUM REPORT: 10/31/2023 20:11 ADDENDUM: Perfusion maps reviewed. Cerebral  blood flow less than 30 percent: 0 mL T-max greater than 6 seconds: 59 mL Mismatch volume: 59 mL Core infarct location: None identified There are scattered areas of elevated T-max throughout the cerebral hemispheres which are mostly favored to be artifactual. However there are a few regions of elevated T-max greater than 8 seconds located within the left frontal lobe as well as within the right temporal occipital lobes which correspond to regions of acute infarct on the same day MRI. Electronically Signed   By: Donnice Mania M.D.   On: 10/31/2023 20:11   Result Date: 10/31/2023 CLINICAL DATA:  Code stroke, neuro deficit, right-sided weakness and aphasia starting 1 hour ago. EXAM: CT ANGIOGRAPHY HEAD AND NECK CT PERFUSION BRAIN TECHNIQUE: Multidetector CT imaging of the head and neck was performed using the standard protocol during bolus administration of intravenous contrast. Multiplanar CT image reconstructions and MIPs were obtained to evaluate the vascular anatomy. Carotid stenosis measurements (when applicable) are obtained utilizing NASCET criteria, using the distal internal carotid diameter as the  denominator. Multiphase CT imaging of the brain was performed following IV bolus contrast injection. Subsequent parametric perfusion maps were calculated using RAPID software. RADIATION DOSE REDUCTION: This exam was performed according to the departmental dose-optimization program which includes automated exposure control, adjustment of the mA and/or kV according to patient size and/or use of iterative reconstruction technique. CONTRAST:  OMNIPAQUE  IOHEXOL  350 MG/ML SOLN COMPARISON:  Same day CT head. FINDINGS: CTA NECK FINDINGS Aortic arch: Common origin of the brachiocephalic and left common carotid arteries. Aortic arch is incompletely visualized with mild atherosclerosis noted. Pulmonary arteries: Not well visualized. Subclavian arteries: Patent bilaterally.  Mild atherosclerosis. Right carotid system:  Patent. Mild atherosclerosis at the carotid bifurcation without hemodynamically significant stenosis. No evidence of dissection. Mild tortuosity of the proximal common carotid artery and the mid cervical ICA. Left carotid system: Patent. Mild atherosclerosis at the carotid bifurcation without hemodynamically significant stenosis. No evidence of dissection. Retropharyngeal course of the distal common carotid and proximal cervical ICA. Mild tortuosity of the cervical ICA. Vertebral arteries: Right vertebral artery is dominant. The left vertebral artery originates on the aortic arch with atherosclerosis adjacent to the origin resulting in mild stenosis. Additional atherosclerosis at the right vertebral artery origin resulting in mild stenosis. Right vertebral artery is patent from the origin to the vertebrobasilar confluence. Non dominant left vertebral artery is patent to the intracranial segment and terminates at the left PICA. Skeleton: No acute findings. Degenerative changes in the cervical spine. Other neck: The visualized airway is patent. No cervical lymphadenopathy. Upper chest: Large left pleural effusion is partially visualized. Patulous thoracic esophagus. Review of the MIP images confirms the above findings CTA HEAD FINDINGS ANTERIOR CIRCULATION: The intracranial ICAs are patent bilaterally. Atherosclerosis of the carotid siphons. Mild stenosis of the right supraclinoid ICA and mild-to-moderate stenosis of the left supraclinoid ICA. No severe stenosis, proximal occlusion, or aneurysm. 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 MCAs: The middle cerebral arteries are patent bilaterally. ACAs: The anterior cerebral arteries are patent bilaterally. POSTERIOR CIRCULATION: No significant stenosis, proximal occlusion, aneurysm, or vascular malformation. PCAs: Patent bilaterally.  Fetal origin of the right PCA. Pcomm: Visualized on the right. SCAs: The superior cerebellar arteries are patent bilaterally.  Basilar artery: Patent AICAs: Not well visualized. PICAs: Patent Vertebral arteries: As above. Venous sinuses: As permitted by contrast timing, patent. 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 Fetal origin of the right PCA. Review of the MIP images confirms the above findings CT Brain Perfusion Findings: Difficulty obtaining perfusion maps. IMPRESSION: No large vessel occlusion. Atherosclerosis at the carotid bifurcations without hemodynamically significant stenosis. Mild stenosis at the vertebral artery origins. Atherosclerosis of the carotid siphons resulting in mild stenosis on the right and mild to moderate stenosis on the left. 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 Large left pleural effusion is partially visualized. **Difficulty obtaining perfusion maps. Will dictate addendum when these are available.** Aortic Atherosclerosis (ICD10-I70.0). Electronically Signed: By: Donnice Mania M.D. On: 10/31/2023 15:20   CT CEREBRAL PERFUSION W CONTRAST Addendum Date: 10/31/2023 ADDENDUM REPORT: 10/31/2023 20:11 ADDENDUM: Perfusion maps reviewed. Cerebral blood flow less than 30 percent: 0 mL T-max greater than 6 seconds: 59 mL Mismatch volume: 59 mL Core infarct location: None identified There are scattered areas of elevated T-max throughout the cerebral hemispheres which are mostly favored to be artifactual. However there are a few regions of elevated T-max greater than 8 seconds located within the left frontal lobe as well as within the right temporal occipital lobes  which correspond to regions of acute infarct on the same day MRI. Electronically Signed   By: Donnice Mania M.D.   On: 10/31/2023 20:11   Result Date: 10/31/2023 CLINICAL DATA:  Code stroke, neuro deficit, right-sided weakness and aphasia starting 1 hour ago. EXAM: CT ANGIOGRAPHY HEAD AND NECK CT PERFUSION BRAIN TECHNIQUE: Multidetector CT imaging of the head and neck was performed using the standard protocol during bolus  administration of intravenous contrast. Multiplanar CT image reconstructions and MIPs were obtained to evaluate the vascular anatomy. Carotid stenosis measurements (when applicable) are obtained utilizing NASCET criteria, using the distal internal carotid diameter as the denominator. Multiphase CT imaging of the brain was performed following IV bolus contrast injection. Subsequent parametric perfusion maps were calculated using RAPID software. RADIATION DOSE REDUCTION: This exam was performed according to the departmental dose-optimization program which includes automated exposure control, adjustment of the mA and/or kV according to patient size and/or use of iterative reconstruction technique. CONTRAST:  OMNIPAQUE  IOHEXOL  350 MG/ML SOLN COMPARISON:  Same day CT head. FINDINGS: CTA NECK FINDINGS Aortic arch: Common origin of the brachiocephalic and left common carotid arteries. Aortic arch is incompletely visualized with mild atherosclerosis noted. Pulmonary arteries: Not well visualized. Subclavian arteries: Patent bilaterally.  Mild atherosclerosis. Right carotid system: Patent. Mild atherosclerosis at the carotid bifurcation without hemodynamically significant stenosis. No evidence of dissection. Mild tortuosity of the proximal common carotid artery and the mid cervical ICA. Left carotid system: Patent. Mild atherosclerosis at the carotid bifurcation without hemodynamically significant stenosis. No evidence of dissection. Retropharyngeal course of the distal common carotid and proximal cervical ICA. Mild tortuosity of the cervical ICA. Vertebral arteries: Right vertebral artery is dominant. The left vertebral artery originates on the aortic arch with atherosclerosis adjacent to the origin resulting in mild stenosis. Additional atherosclerosis at the right vertebral artery origin resulting in mild stenosis. Right vertebral artery is patent from the origin to the vertebrobasilar confluence. Non dominant left  vertebral artery is patent to the intracranial segment and terminates at the left PICA. Skeleton: No acute findings. Degenerative changes in the cervical spine. Other neck: The visualized airway is patent. No cervical lymphadenopathy. Upper chest: Large left pleural effusion is partially visualized. Patulous thoracic esophagus. Review of the MIP images confirms the above findings CTA HEAD FINDINGS ANTERIOR CIRCULATION: The intracranial ICAs are patent bilaterally. Atherosclerosis of the carotid siphons. Mild stenosis of the right supraclinoid ICA and mild-to-moderate stenosis of the left supraclinoid ICA. No severe stenosis, proximal occlusion, or aneurysm. 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 MCAs: The middle cerebral arteries are patent bilaterally. ACAs: The anterior cerebral arteries are patent bilaterally. POSTERIOR CIRCULATION: No significant stenosis, proximal occlusion, aneurysm, or vascular malformation. PCAs: Patent bilaterally.  Fetal origin of the right PCA. Pcomm: Visualized on the right. SCAs: The superior cerebellar arteries are patent bilaterally. Basilar artery: Patent AICAs: Not well visualized. PICAs: Patent Vertebral arteries: As above. Venous sinuses: As permitted by contrast timing, patent. 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 Fetal origin of the right PCA. Review of the MIP images confirms the above findings CT Brain Perfusion Findings: Difficulty obtaining perfusion maps. IMPRESSION: No large vessel occlusion. Atherosclerosis at the carotid bifurcations without hemodynamically significant stenosis. Mild stenosis at the vertebral artery origins. Atherosclerosis of the carotid siphons resulting in mild stenosis on the right and mild to moderate stenosis on the left. 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 Large left pleural effusion is partially visualized. **Difficulty obtaining perfusion maps. Will dictate  addendum when these are available.**  Aortic Atherosclerosis (ICD10-I70.0). Electronically Signed: By: Donnice Mania M.D. On: 10/31/2023 15:20   MR BRAIN WO CONTRAST Result Date: 10/31/2023 CLINICAL DATA:  Neuro deficit, concern for stroke. EXAM: MRI HEAD WITHOUT CONTRAST TECHNIQUE: Multiplanar, multiecho pulse sequences of the brain and surrounding structures were obtained without intravenous contrast. COMPARISON:  Same day CT head and CTA head and neck. FINDINGS: Brain: Scattered foci of acute infarct involving the cortex and subcortical white matter within the left frontal lobe with involvement of the left middle and inferior frontal gyri. Additional foci of acute infarct in the right occipital lobe and right temporal occipital lobes within the cortex and subcortical white matter. Chronic microhemorrhage in the posterior right frontal lobe. Scattered and confluent foci of T2/FLAIR hyperintensity involving the periventricular and subcortical white matter. Signal changes within the pons likely related to chronic microvascular ischemic changes without lacunar infarct identified. No midline shift. The basilar cisterns are patent. No extra-axial fluid collections. Ventricles: Prominence of the lateral ventricles suggestive of underlying parenchymal volume loss. Vascular: Skull base flow voids are visualized. Skull and upper cervical spine: No focal abnormality. Sinuses/Orbits: Orbits are symmetric. Mucosal thickening in the left maxillary sinus. Other: Mastoid air cells are clear. IMPRESSION: Scattered acute infarcts in the cortex and subcortical white matter of the left frontal lobe as well as the right temporal subtle lobes as above. Moderate chronic microvascular ischemic changes and mild parenchymal volume loss. Signal abnormality in the pons likely related to chronic microvascular ischemic changes without lacunar infarct identified. Electronically Signed   By: Donnice Mania M.D.   On: 10/31/2023 18:46   CT HEAD CODE STROKE WO CONTRAST Addendum  Date: 10/31/2023 ADDENDUM REPORT: 10/31/2023 14:25 ADDENDUM: These results were called by telephone at the time of interpretation on 10/31/2023 at 2:21 pm to provider Dr. Lavonia Pat, who verbally acknowledged these results. Electronically Signed   By: Donnice Mania M.D.   On: 10/31/2023 14:25   Result Date: 10/31/2023 CLINICAL DATA:  Code stroke. Neuro deficit, concern for stroke, right-sided weakness. EXAM: CT HEAD WITHOUT CONTRAST TECHNIQUE: Contiguous axial images were obtained from the base of the skull through the vertex without intravenous contrast. RADIATION DOSE REDUCTION: This exam was performed according to the departmental dose-optimization program which includes automated exposure control, adjustment of the mA and/or kV according to patient size and/or use of iterative reconstruction technique. COMPARISON:  None Available. FINDINGS: Brain: No acute intracranial hemorrhage. No CT evidence of acute infarct. Nonspecific hypoattenuation in the periventricular and subcortical white matter favored to reflect chronic microvascular ischemic changes. Age indeterminate lacunar infarct in the right ventral pons. No edema, mass effect, or midline shift. The basilar cisterns are patent. Ventricles: The ventricles are normal. Vascular: Atherosclerotic calcifications of the carotid siphons and intracranial vertebral arteries. No hyperdense vessel. Skull: No acute or aggressive finding. Orbits: Orbits are symmetric. Sinuses: Mucosal thickening in the maxillary sinuses, left greater than right. Other: Mastoid air cells are clear. ASPECTS [facility] Stroke Program Early CT Score) - Ganglionic level infarction (caudate, lentiform nuclei, internal capsule, insula, M1-M3 cortex): 7 - Supraganglionic infarction (M4-M6 cortex): 3 Total score (0-10 with 10 being normal): 10 IMPRESSION: 1. No acute intracranial hemorrhage. 2. Age indeterminate lacunar infarct in the right ventral pons. 3. Chronic microvascular ischemic changes.  4. ASPECTS is 10 Electronically Signed: By: Donnice Mania M.D. On: 10/31/2023 14:14     Subjective: - no chest pain, shortness of breath, no abdominal pain, nausea or vomiting.   Discharge  Exam: BP (!) 140/101 (BP Location: Right Arm)   Pulse 76   Temp 98.4 F (36.9 C) (Axillary)   Resp 18   Ht 5' 9 (1.753 m)   Wt 130.8 kg   SpO2 97%   BMI 42.58 kg/m   General: Pt is alert, awake, not in acute distress Cardiovascular: RRR, S1/S2 +, no rubs, no gallops Respiratory: CTA bilaterally, no wheezing, no rhonchi Abdominal: Soft, NT, ND, bowel sounds + Extremities: no edema, no cyanosis    The results of significant diagnostics from this hospitalization (including imaging, microbiology, ancillary and laboratory) are listed below for reference.     Microbiology: No results found for this or any previous visit (from the past 240 hours).   Labs: Basic Metabolic Panel: Recent Labs  Lab 10/31/23 1433 10/31/23 1438  NA 140 141  K 4.0 4.0  CL 104 105  CO2 23  --   GLUCOSE 101* 104*  BUN 13 13  CREATININE 0.98 0.90  CALCIUM  9.3  --    Liver Function Tests: Recent Labs  Lab 10/31/23 1433  AST 22  ALT 16  ALKPHOS 70  BILITOT 0.8  PROT 7.9  ALBUMIN 3.7   CBC: Recent Labs  Lab 10/31/23 1433 10/31/23 1438  WBC 7.8  --   NEUTROABS 4.0  --   HGB 16.9 17.3*  HCT 51.2 51.0  MCV 98.3  --   PLT 365  --    CBG: Recent Labs  Lab 10/31/23 1403  GLUCAP 107*   Hgb A1c No results for input(s): HGBA1C in the last 72 hours. Lipid Profile Recent Labs    11/01/23 0639  CHOL 182  HDL 49  LDLCALC 116*  TRIG 84  CHOLHDL 3.7   Thyroid  function studies No results for input(s): TSH, T4TOTAL, T3FREE, THYROIDAB in the last 72 hours.  Invalid input(s): FREET3 Urinalysis    Component Value Date/Time   COLORURINE YELLOW 09/14/2023 0416   APPEARANCEUR CLEAR 09/14/2023 0416   LABSPEC 1.021 09/14/2023 0416   PHURINE 5.0 09/14/2023 0416   GLUCOSEU NEGATIVE  09/14/2023 0416   HGBUR MODERATE (A) 09/14/2023 0416   BILIRUBINUR NEGATIVE 09/14/2023 0416   BILIRUBINUR positive 03/16/2021 1514   KETONESUR NEGATIVE 09/14/2023 0416   PROTEINUR 100 (A) 09/14/2023 0416   UROBILINOGEN 1.0 03/16/2021 1514   UROBILINOGEN 0.2 09/06/2008 0840   NITRITE NEGATIVE 09/14/2023 0416   LEUKOCYTESUR NEGATIVE 09/14/2023 0416    FURTHER DISCHARGE INSTRUCTIONS:   Get Medicines reviewed and adjusted: Please take all your medications with you for your next visit with your Primary MD   Laboratory/radiological data: Please request your Primary MD to go over all hospital tests and procedure/radiological results at the follow up, please ask your Primary MD to get all Hospital records sent to his/her office.   In some cases, they will be blood work, cultures and biopsy results pending at the time of your discharge. Please request that your primary care M.D. goes through all the records of your hospital data and follows up on these results.   Also Note the following: If you experience worsening of your admission symptoms, develop shortness of breath, life threatening emergency, suicidal or homicidal thoughts you must seek medical attention immediately by calling 911 or calling your MD immediately  if symptoms less severe.   You must read complete instructions/literature along with all the possible adverse reactions/side effects for all the Medicines you take and that have been prescribed to you. Take any new Medicines after you have completely understood  and accpet all the possible adverse reactions/side effects.    Do not drive when taking Pain medications or sleeping medications (Benzodaizepines)   Do not take more than prescribed Pain, Sleep and Anxiety Medications. It is not advisable to combine anxiety,sleep and pain medications without talking with your primary care practitioner   Special Instructions: If you have smoked or chewed Tobacco  in the last 2 yrs please  stop smoking, stop any regular Alcohol  and or any Recreational drug use.   Wear Seat belts while driving.   Please note: You were cared for by a hospitalist during your hospital stay. Once you are discharged, your primary care physician will handle any further medical issues. Please note that NO REFILLS for any discharge medications will be authorized once you are discharged, as it is imperative that you return to your primary care physician (or establish a relationship with a primary care physician if you do not have one) for your post hospital discharge needs so that they can reassess your need for medications and monitor your lab values.  Time coordinating discharge: 35 minutes  SIGNED:  Nilda Fendt, MD, PhD 11/02/2023, 9:04 AM

## 2023-11-01 NOTE — Progress Notes (Signed)
 Pt is transferred from [facility] to [facility] 3W room 22. He is alert and fully oriented x 4, stable hemodynamically, Afib on the monitor, HR is under controlled, BP 175/106 MAP 126 mmHg, normal respiratory effort. No obvious acute distress at arrival. Initial neuro check at arrival as document below. We will continue to monitor.     10/31/23 2257  [place] Work Intensity Score (Update with each assessment and as needed)  Work Intensity Score (Level) 3  Level 3 Intensity B.Frequent assistance requests (patient or family);S.Neuro checks Q2 hrs  Neurological  Neuro (WDL) X  Orientation Level Oriented X4  Cognition Appropriate at baseline;Appropriate attention/concentration;Appropriate judgement;Appropriate safety awareness;Appropriate for developmental age;Follows commands;No memory impairment  Speech Clear;Appropriate for developmental age;Appropriate at baseline  R Pupil Size (mm) 3  R Pupil Shape Round  R Pupil Reaction Brisk  L Pupil Size (mm) 3  L Pupil Shape Round  L Pupil Reaction Brisk  Motor Function/Sensation Assessment Grip;Head;Elbow flexion;Elbow extension;Pronator drift;Dorsiflexion;Plantar flexion;Arm ataxia;Leg ataxia;Motor response;Sensation;Motor strength  Facial Symmetry Symmetrical  R Hand Grip Strong  L Hand Grip Strong  R Elbow Extension (Push/Biceps) Strong  L Elbow Extension (Push/Biceps) Strong  R Elbow Flexion (Pull/Triceps) Strong  L Elbow Flexion (Pull/Triceps) Strong  Right Pronator Drift Absent  Left Pronator Drift Absent  R Foot Dorsiflexion Strong  L Foot Dorsiflexion Strong  R Foot Plantar Flexion Strong  L Foot Plantar Flexion Strong  R Finger to Nose (Point to [employer]) Smooth  L Finger to Nose (Point to [employer]) Smooth  R Heel to [employer] (Point to [employer]) Smooth  L Heel to [employer] to [employer]) Smooth  RUE Motor Response Purposeful movement  RUE Sensation Full sensation  RUE Motor Strength 5  LUE Motor Response Purposeful movement  LUE Sensation Full sensation   LUE Motor Strength 5  RLE Motor Response Purposeful movement  RLE Sensation Full sensation  RLE Motor Strength 5  LLE Motor Response Purposeful movement  LLE Sensation Full sensation  LLE Motor Strength 5  Neuro Symptoms None  Neuro Additional Assessments NIH stroke scale;Glasgow Coma Scale  Glasgow Coma Scale  Eye Opening 4  Best Verbal Response (NON-intubated) 5  Best Motor Response 6  Glasgow Coma Scale Score 15  NIH Stroke Scale   Dizziness Present No  Headache Present No  Interval Initial  Level of Consciousness (1a.)    0  LOC Questions (1b. )    0  LOC Commands (1c. )    0  Best Gaze (2. )   0  Visual (3. )   0  Facial Palsy (4. )     0  Motor Arm, Left (5a. )    0  Motor Arm, Right (5b. )  0  Motor Leg, Left (6a. )   0  Motor Leg, Right (6b. )  0  Limb Ataxia (7. ) 0  Sensory (8. )   0  Best Language (9. )   0  Dysarthria (10. ) 1  Extinction/Inattention (11.)    0  Complete NIHSS TOTAL 1  Neurological  Level of Consciousness Alert    Wendi Dash, RN

## 2023-11-02 DIAGNOSIS — I639 Cerebral infarction, unspecified: Secondary | ICD-10-CM | POA: Diagnosis not present

## 2023-11-02 MED ORDER — FUROSEMIDE 40 MG PO TABS: 40.0000 mg | ORAL_TABLET | Freq: Every day | ORAL | 0 refills | Status: AC

## 2023-11-02 MED ORDER — LISINOPRIL 5 MG PO TABS: 5.0000 mg | ORAL_TABLET | Freq: Every day | ORAL | 0 refills | Status: AC

## 2023-11-02 MED ORDER — METOPROLOL TARTRATE 50 MG PO TABS: 50.0000 mg | ORAL_TABLET | Freq: Two times a day (BID) | ORAL | 0 refills | Status: DC

## 2023-11-02 MED ORDER — ROSUVASTATIN CALCIUM 20 MG PO TABS: 20.0000 mg | ORAL_TABLET | Freq: Every day | ORAL | 0 refills | Status: AC

## 2023-11-02 MED ORDER — APIXABAN 5 MG PO TABS: 5.0000 mg | ORAL_TABLET | Freq: Two times a day (BID) | ORAL | 0 refills | Status: DC

## 2023-11-02 NOTE — Progress Notes (Signed)
 Patient alert and oriented, verbalized understanding of dc instructions. All belongings and paperwork given to patient including strode education booklet.Patient to transfer to [employer] and cab voucher will be provided. Patient confirmed he has his house key to enter home. Primary RN Amado made aware and approved patient to take cab going home. VSS. Patient has no complaints at time of discharge.

## 2023-11-02 NOTE — Discharge Instructions (Signed)
 Follow with Katrinka Garnette KIDD, MD in 5-7 days  Please get a complete blood count and chemistry panel checked by your Primary MD at your next visit, and again as instructed by your Primary MD. Please get your medications reviewed and adjusted by your Primary MD.  Please request your Primary MD to go over all Hospital Tests and Procedure/Radiological results at the follow up, please get all Hospital records sent to your Prim MD by signing hospital release before you go home.  In some cases, there will be blood work, cultures and biopsy results pending at the time of your discharge. Please request that your primary care M.D. goes through all the records of your hospital data and follows up on these results.  If you had Pneumonia of Lung problems at the Hospital: Please get a 2 view Chest X ray done in 6-8 weeks after hospital discharge or sooner if instructed by your Primary MD.  If you have Congestive Heart Failure: Please call your Cardiologist or Primary MD anytime you have any of the following symptoms:  1) 3 pound weight gain in 24 hours or 5 pounds in 1 week  2) shortness of breath, with or without a dry hacking cough  3) swelling in the hands, feet or stomach  4) if you have to sleep on extra pillows at night in order to breathe  Follow cardiac low salt diet and 1.5 lit/day fluid restriction.  If you have diabetes Accuchecks 4 times/day, Once in AM empty stomach and then before each meal. Log in all results and show them to your primary doctor at your next visit. If any glucose reading is under 80 or above 300 call your primary MD immediately.  If you have Seizure/Convulsions/Epilepsy: Please do not drive, operate heavy machinery, participate in activities at heights or participate in high speed sports until you have seen by Primary MD or a Neurologist and advised to do so again. Per [place] statutes, patients with seizures are not allowed to drive until they have been  seizure-free for six months.  Use caution when using heavy equipment or power tools. Avoid working on ladders or at heights. Take showers instead of baths. Ensure the water temperature is not too high on the home water heater. Do not go swimming alone. Do not lock yourself in a room alone (i.e. bathroom). When caring for infants or small children, sit down when holding, feeding, or changing them to minimize risk of injury to the child in the event you have a seizure. Maintain good sleep hygiene. Avoid alcohol.   If you had Gastrointestinal Bleeding: Please ask your Primary MD to check a complete blood count within one week of discharge or at your next visit. Your endoscopic/colonoscopic biopsies that are pending at the time of discharge, will also need to followed by your Primary MD.  Get Medicines reviewed and adjusted. Please take all your medications with you for your next visit with your Primary MD  Please request your Primary MD to go over all hospital tests and procedure/radiological results at the follow up, please ask your Primary MD to get all Hospital records sent to his/her office.  If you experience worsening of your admission symptoms, develop shortness of breath, life threatening emergency, suicidal or homicidal thoughts you must seek medical attention immediately by calling 911 or calling your MD immediately  if symptoms less severe.  You must read complete instructions/literature along with all the possible adverse reactions/side effects for all the Medicines you  take and that have been prescribed to you. Take any new Medicines after you have completely understood and accpet all the possible adverse reactions/side effects.   Do not drive or operate heavy machinery when taking Pain medications.   Do not take more than prescribed Pain, Sleep and Anxiety Medications  Special Instructions: If you have smoked or chewed Tobacco  in the last 2 yrs please stop smoking, stop any regular  Alcohol  and or any Recreational drug use.  Wear Seat belts while driving.  Please note You were cared for by a hospitalist during your hospital stay. If you have any questions about your discharge medications or the care you received while you were in the hospital after you are discharged, you can call the unit and asked to speak with the hospitalist on call if the hospitalist that took care of you is not available. Once you are discharged, your primary care physician will handle any further medical issues. Please note that NO REFILLS for any discharge medications will be authorized once you are discharged, as it is imperative that you return to your primary care physician (or establish a relationship with a primary care physician if you do not have one) for your aftercare needs so that they can reassess your need for medications and monitor your lab values.  You can reach the hospitalist office at phone [phone] or fax [phone]   If you do not have a primary care physician, you can call [phone] for a physician referral.  Activity: As tolerated with Full fall precautions use walker/cane & assistance as needed    Diet: heart healthy  Disposition Home

## 2023-11-03 ENCOUNTER — Inpatient Hospital Stay: Admitting: Family Medicine

## 2023-11-18 ENCOUNTER — Encounter: Payer: Self-pay | Admitting: Family Medicine

## 2023-11-18 NOTE — Progress Notes (Deleted)
 Cardiology Office Note:  .   Date:  11/18/2023  ID:  [name] [name], DOB Sep 28, 1960, MRN [id number] PCP: Katrinka Garnette KIDD, MD  [facility] Providers Cardiologist:  Vina Gull, MD { Click to update primary MD,subspecialty MD or APP then REFRESH:1}   History of Present Illness: .   [name] [name] is a 63 y.o. male  with PMHx of HTN, obesity, persistent atrial fib diagnosed 07/2023, coronary atherosclerosis on CT (07/29/2023 CTA), pericarditis with small pericardial perfusion 09/2023, suspected chronic HFpEF (07/30/2023 65-70%, 09/13/2023 60-65%), RBBB (since EKG 09/12/2023), mild dilation of ascending aorta (echo 09/13/2023), depression, impetigo, intertigo, diverticulitis who reports to [facility] office for  hospital follow up.   Patient first seen by heartcare in hospital consult 3/18-20/2025 for new onset afib with RVR and HTN. He was rate controlled with plans for outpatient DCCV. ECHO showed EF 65-70% with mild LVH. CT angiogram showed no acute findings noted, age advanced coronary artery atherosclerosis, small pericardial effusion and mild cardiomegaly. D/C PTA meds amlodipine  and Olmesartan . Started on Crestor  with LDL 70. Continued on Eliquis  5 mg BID, Lopressor  25 mg BID, and Crestor  5 mg.   Presented to hospital again 5/2-10/2023 for chest pain and diagnosed with Pericarditis. Treated with Colchicine  0.6 bid; ibuprofen  400 bid (x 7 days) and protonix  (given Eliquis  use). EKG remains in afib. Noted he did not have DCCV yet and recommended holding given inflammation. BP was severely elevated 150/100's. Recommended outpatient sleep study.   Presented to hospital again 5/21-24/2025 for orthopnea and diagnosed with acute on chronic HFpEF (BNP 180). Diuresed with IV lasix  then converted to PO lasix . Noted improvement in SOB and able to ambulate w/o SOB or hypoxia. EKG remains in Afib.   Discharged on Lasix  40 mg daily, KCL 20 MEQ, Amlodipine  10 mg, Lisinopril  5 mg daily, Lopressor  50 mg BID, Eliquis  5  mg BID,  and Crestor  5 mg daily.   Presented to hospital again 6/20-22/2025 with concerns for stroke and diagnosed with acute CVA. Noted that he ran out of Eliquis . Recommended outpatient PT. He returned to baseline.   Today, reports ### and denies ###.  Reports compliance with medications.  Dietary habitats:  Activity level:  Social: Denies tobacco use/Binging ETOH/drug use  Denies any hospitalizations or visits to the emergency department.   Persistent atrial fib  Newly diagnosed during admission 07/2023 with other complaints of chest pain, questionable pericarditis but normal inflammatory markers Echo 07/30/2023 showed EF 65-70%, mild  LVH, elevated LV EDP, trivial MVR Readmitted 5/2-10/2023 same presentation and diagnosed with Pericarditis. See below. Noted he did not have DCCV and recommended holding off given inflammation. Limited echo 09/13/2023 showed 60-65%, mild LVH, mildly reduced RV function,  severely dilated LA, Mild MVR  EKG in OV shows  Continue on Lopressor  50 mg BID and Eliquis  5 mg BID Eliquis  is the appropriate dose given her age, weight and renal function. (63 y/o, Cr 0.9, wt ??)  (Age > 80, Body wt < 60 kg, Cr > 1.5; if +2 then decrease dose  2.5 mg BID)  Discuss DCCV. Patient agrees. Patient confirms uninterrupted AC for 3-4 weeks. Explained the importance of continuing uninterrupted AC  Order sleep  Pericarditis with small pericardial effusion 09/2023 Readmitted 5/2-10/2023  and diagnosed with Pericarditis seemed more likely now with elevated inflammatory markers now and thickened pericardium on CT.  Started on colchicine  0.6 mg twice daily, ibuprofen  400 mg twice daily x 7, Protonix  Limited echo 09/13/2023 showed small posterior pericardial effusion.  No JVD, muffled heart sounds or hypotension.  Can consider repeat echo if symptoms worsen or reoccur   Coronary atherosclerosis on CT HLD, LDL goal <  Noted on 07/29/2023 CTA  10/2023 LDL 116, LFT WNL  In the setting of no  chest pain, we discussed focusing on management of lifestyle, HTN and HLD Continue Crestor  20 mg.  Order FLP and CMP for 01/2024.   Suspected chronic HFpEF Presented to hospital again 5/21-24/2025 for orthopnea and diagnosed with acute on chronic HFpEF (BNP 180). Diuresed with IV lasix  then converted to PO lasix . Denies SOB or edema. Appear Euvolemic on exam.  Continue on Lasix  40 mg daily, KCL 20 MEQ, Lisinopril  5 mg daily, Lopressor  50 mg BID  HTN  BP in OV well controlled:  Continue amlodipine  10 mg, Lisinopril  5 mg daily, Lopressor  50 mg BID  RBBB First noted on EKG 09/12/2023 Continue to monitor  Mild dilation of ascending aorta  ECHO 09/13/2023: Mildly thick/calcified AV w/o AS, mildly dilated ascending aorta measuring 39 mm Will continue to monitor  CVA  Presented to hospital again 6/20-22/2025 with concerns for stroke and diagnosed with acute CVA. Noted that he ran out of Eliquis .  Discussed  the importance of daily compliance with AC to reduce risk of recurrent stroke. Explained the  relationship between afib and stroke.  ROS: 10 point review of system has been reviewed and considered negative except ones been listed in the HPI.   Studies Reviewed: SABRA   ECHO 09/2023 IMPRESSIONS   1. Left ventricular ejection fraction, by estimation, is 60 to 65%. The  left ventricle has normal function. The left ventricle has no regional  wall motion abnormalities. There is mild left ventricular hypertrophy.  Left ventricular diastolic parameters  are indeterminate.   2. Right ventricular systolic function is mildly reduced. The right  ventricular size is mildly enlarged. Tricuspid regurgitation signal is  inadequate for assessing PA pressure.   3. Left atrial size was severely dilated.   4. A small pericardial effusion is present. The pericardial effusion is  posterior to the left ventricle.   5. The mitral valve is abnormal. Mild mitral valve regurgitation. No  evidence of mitral  stenosis.   6. The aortic valve was not well visualized. There is mild calcification  of the aortic valve. There is mild thickening of the aortic valve. Aortic  valve regurgitation is trivial. Aortic valve sclerosis is present, with no  evidence of aortic valve  stenosis.   7. Aortic dilatation noted. There is mild dilatation of the ascending  aorta, measuring 39 mm.   8. The inferior vena cava is dilated in size with >50% respiratory  variability, suggesting right atrial pressure of 8 mmHg.  Risk Assessment/Calculations:   {Does this patient have ATRIAL FIBRILLATION?:[phone]} No BP recorded.  {Refresh Note OR Click here to enter BP  :1}***       Physical Exam:   VS:  There were no vitals taken for this visit.   Wt Readings from Last 3 Encounters:  11/02/23 288 lb 5.8 oz (130.8 kg)  10/04/23 286 lb (129.7 kg)  09/12/23 (!) 303 lb 6.4 oz (137.6 kg)    GEN: Well nourished, well developed in no acute distress while sitting in chair.  NECK: No JVD; No carotid bruits CARDIAC: ***RRR, no murmurs, rubs, gallops RESPIRATORY:  Clear to auscultation without rales, wheezing or rhonchi  ABDOMEN: Soft, non-tender, non-distended EXTREMITIES:  No edema; No deformity   ASSESSMENT AND PLAN: .   ***    {  Are you ordering a CV Procedure (e.g. stress test, cath, DCCV, TEE, etc)?   Press F2        :[id number]}  Dispo: ***  Signed, Lorette CINDERELLA Kapur, PA-C

## 2023-11-20 ENCOUNTER — Ambulatory Visit: Attending: General Practice | Admitting: General Practice

## 2023-12-25 ENCOUNTER — Other Ambulatory Visit: Payer: Self-pay | Admitting: Dermatology

## 2023-12-25 MED ORDER — COSENTYX UNOREADY 300 MG/2ML ~~LOC~~ SOAJ: 300.0000 mg | SUBCUTANEOUS | 12 refills | Status: AC

## 2023-12-25 NOTE — Progress Notes (Signed)
 Needed refill

## 2024-01-02 ENCOUNTER — Encounter: Payer: Self-pay | Admitting: Pharmacist

## 2024-01-02 NOTE — Progress Notes (Signed)
 Pharmacy Quality Measure Review  This patient is appearing on a report for being at risk of failing the adherence measure for cholesterol (statin) medications this calendar year.   Medication: rosuvastatin  20mg   Last fill date: 11/02/2023 for 30 day supply  Spoke with Arloa Prior and they filled rosuvastatin  5mg  on 8/18 for 30 day supply. Looks like LDL was 116 when he was in the hospital 6/20 to 11/02/2023 for CVA and dose of rosuvastatin  was increased to 20mg .   Prior to CVA patient has not refilled several of his medications - rosuvastatin , Eliquis , lisinopril .   Reviewed refill history with Arloa Prior today:  Eliquis  - filled 30 day supply on 6/20, 7/18 and 8/18 Metoprolol  - filled 30 day supply on 6/20 and 8/18 Furosemide  - filled 30 day supply on 5/24, 6/20 and 11/28/2023 Lisinopril  - filled 30 day supply on 5/24, 6/20 and 7/18  Left voicemail for patient to return my call at their convenience. Also sent MyChart message to patient about refills and reminded to call to reschedule follow up with Dr Katrinka.   Madelin Ray, PharmD Clinical Pharmacist [facility] Primary Care  Population Health [phone]

## 2024-02-03 ENCOUNTER — Telehealth: Payer: Self-pay | Admitting: Pharmacist

## 2024-02-03 NOTE — Progress Notes (Signed)
 Pharmacy Quality Measure Review  This patient is appearing on a report for being at risk of failing the adherence measure for cholesterol (statin) and hypertension (ACEi/ARB) medications this calendar year.   Medication: rosuvastatin  20mg   Last fill date:11/02/2023 for 30 day supply  Medication: lisinopril   Last fill date: 11/28/2023 for 30 day supply  Spoke with Arloa Prior and they filled rosuvastatin  5mg , Eliquis  5mg  and metoprolol  on 01/29/2024 and was picked up the same day. All were for 30 day supplies.  He has not filled lisinopril  since 11/28/2023.   Looks like LDL was 116 when he was in the hospital 6/20 to 11/02/2023 for CVA and dose of rosuvastatin  was increased to 20mg .    Left voicemail for patient to return my call at their convenience. Also reminded him to rescheduled follow up with PCP. Missed hospital follow up.  Will forward message to Dr Carolee clinical team to try to reach out for appointment.   Madelin Ray, PharmD Clinical Pharmacist [facility] Primary Care  Population Health [phone]

## 2024-04-26 ENCOUNTER — Other Ambulatory Visit: Payer: Self-pay | Admitting: Family Medicine

## 2024-04-26 ENCOUNTER — Telehealth: Payer: Self-pay | Admitting: Family Medicine

## 2024-04-26 ENCOUNTER — Other Ambulatory Visit: Payer: Self-pay

## 2024-04-26 MED ORDER — APIXABAN 5 MG PO TABS: 5.0000 mg | ORAL_TABLET | Freq: Two times a day (BID) | ORAL | 0 refills | Status: DC

## 2024-04-26 NOTE — Telephone Encounter (Signed)
 Patient was prescribed eliquis  while in the hospital in June. Took last pill last night and needs a refill, okay to refill? Was last refilled by hospitalist, he spoke to hospitalist this morning and was told he needed to get refill from PCP.   Copied from CRM #[id number]. Topic: General - Other >> Apr 26, 2024 12:28 PM Aleatha C wrote: Reason for CRM: Patient was at [place] for 1 night then went to [facility] and [place] left notes about patient being on something other than the apixaban  (ELIQUIS ) 5 MG TABS tablet please give patient a call today or mychart which ever is easier for Dr therapist, nutritional

## 2024-04-26 NOTE — Telephone Encounter (Signed)
 You may give 30 days BUT its bene over 3 years since visit- he needs appointment before January 15th. Please schedule him

## 2024-04-26 NOTE — Telephone Encounter (Signed)
 Copied from CRM #[id number]. Topic: Clinical - Medication Refill >> Apr 26, 2024 12:27 PM Aleatha C wrote: Medication: apixaban  (ELIQUIS ) 5 MG TABS tablet    Has the patient contacted their pharmacy? Yes (Agent: If no, request that the patient contact the pharmacy for the refill. If patient does not wish to contact the pharmacy document the reason why and proceed with request.) (Agent: If yes, when and what did the pharmacy advise?)  This is the patient's preferred pharmacy:  [facility] [id number] [place], [place] - [address] [place] [address] Phone: [phone] Fax: [phone]   Is this the correct pharmacy for this prescription? Yes If no, delete pharmacy and type the correct one.   Has the prescription been filled recently? No  Is the patient out of the medication? Yes  Has the patient been seen for an appointment in the last year OR does the patient have an upcoming appointment? Yes  Can we respond through MyChart? No  Agent: Please be advised that Rx refills may take up to 3 business days. We ask that you follow-up with your pharmacy.

## 2024-04-27 ENCOUNTER — Other Ambulatory Visit: Payer: Self-pay | Admitting: Family Medicine

## 2024-04-27 MED ORDER — METOPROLOL TARTRATE 50 MG PO TABS: 50.0000 mg | ORAL_TABLET | Freq: Two times a day (BID) | ORAL | 0 refills | Status: DC

## 2024-04-27 NOTE — Telephone Encounter (Signed)
 LVM to schedule an ov ASAP with Hunter before 05/28/23.

## 2024-04-27 NOTE — Telephone Encounter (Signed)
 Copied from CRM #[id number]. Topic: Clinical - Medication Refill >> Apr 27, 2024 11:21 AM Pinkey ORN wrote: Medication: metoprolol  tartrate (LOPRESSOR ) 50 MG tablet  Has the patient contacted their pharmacy? Yes (Agent: If no, request that the patient contact the pharmacy for the refill. If patient does not wish to contact the pharmacy document the reason why and proceed with request.) (Agent: If yes, when and what did the pharmacy advise?)  This is the patient's preferred pharmacy:  [facility] [id number] [place], [place] - [address] [place] [address] Phone: [phone] Fax: [phone]   Is this the correct pharmacy for this prescription? Yes If no, delete pharmacy and type the correct one.   Has the prescription been filled recently? No  Is the patient out of the medication? Yes  Has the patient been seen for an appointment in the last year OR does the patient have an upcoming appointment? Yes  Can we respond through MyChart? Yes  Agent: Please be advised that Rx refills may take up to 3 business days. We ask that you follow-up with your pharmacy.

## 2024-05-04 ENCOUNTER — Encounter: Admitting: Family Medicine

## 2024-05-24 ENCOUNTER — Other Ambulatory Visit: Payer: Self-pay | Admitting: Family Medicine

## 2024-05-24 ENCOUNTER — Ambulatory Visit: Admitting: Family Medicine

## 2024-05-24 MED ORDER — APIXABAN 5 MG PO TABS: 5.0000 mg | ORAL_TABLET | Freq: Two times a day (BID) | ORAL | 0 refills | Status: AC

## 2024-05-24 MED ORDER — METOPROLOL TARTRATE 50 MG PO TABS: 50.0000 mg | ORAL_TABLET | Freq: Two times a day (BID) | ORAL | 0 refills | Status: DC

## 2024-05-24 NOTE — Telephone Encounter (Signed)
 Copied from CRM [id number]. Topic: Clinical - Medication Refill >> May 24, 2024 10:19 AM Amy B wrote: Medication: apixaban  (ELIQUIS ) 5 MG TABS tablet metoprolol  tartrate (LOPRESSOR ) 50 MG tablet  Has the patient contacted their pharmacy? No (Agent: If no, request that the patient contact the pharmacy for the refill. If patient does not wish to contact the pharmacy document the reason why and proceed with request.) (Agent: If yes, when and what did the pharmacy advise?)  This is the patient's preferred pharmacy:  [facility] [id number] [place], [place] - [address] [place] [address] Phone: [phone] Fax: [phone]  [facility] - [place], [place] - [address] [facility] [address] [place] [address] Phone: [phone] Fax: [phone]  Is this the correct pharmacy for this prescription? Yes If no, delete pharmacy and type the correct one.   Has the prescription been filled recently? No  Is the patient out of the medication? No  Has the patient been seen for an appointment in the last year OR does the patient have an upcoming appointment? Yes  Can we respond through MyChart? Yes  Agent: Please be advised that Rx refills may take up to 3 business days. We ask that you follow-up with your pharmacy.

## 2024-06-16 ENCOUNTER — Other Ambulatory Visit: Payer: Self-pay | Admitting: Family Medicine

## 2024-06-22 ENCOUNTER — Ambulatory Visit: Admitting: Family Medicine

## 2024-08-30 ENCOUNTER — Encounter: Admitting: Family Medicine
